# Patient Record
Sex: Female | Born: 1965
Health system: Southern US, Community
[De-identification: ages and names within clinical notes are randomized; demographics above are authoritative.]

## PROBLEM LIST (undated history)

## (undated) DIAGNOSIS — R51 Headache: Secondary | ICD-10-CM

## (undated) DIAGNOSIS — D693 Immune thrombocytopenic purpura: Secondary | ICD-10-CM

## (undated) DIAGNOSIS — R32 Unspecified urinary incontinence: Secondary | ICD-10-CM

## (undated) DIAGNOSIS — D126 Benign neoplasm of colon, unspecified: Secondary | ICD-10-CM

## (undated) DIAGNOSIS — Z973 Presence of spectacles and contact lenses: Secondary | ICD-10-CM

## (undated) DIAGNOSIS — R519 Headache, unspecified: Secondary | ICD-10-CM

## (undated) DIAGNOSIS — J3089 Other allergic rhinitis: Secondary | ICD-10-CM

## (undated) DIAGNOSIS — M545 Low back pain, unspecified: Secondary | ICD-10-CM

## (undated) DIAGNOSIS — E039 Hypothyroidism, unspecified: Secondary | ICD-10-CM

## (undated) DIAGNOSIS — Z87898 Personal history of other specified conditions: Secondary | ICD-10-CM

## (undated) DIAGNOSIS — K219 Gastro-esophageal reflux disease without esophagitis: Secondary | ICD-10-CM

## (undated) HISTORY — DX: Personal history of other specified conditions: Z87.898

## (undated) HISTORY — DX: Benign neoplasm of colon, unspecified: D12.6

## (undated) HISTORY — DX: Immune thrombocytopenic purpura: D69.3

---
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
## 1988-05-03 DIAGNOSIS — D693 Immune thrombocytopenic purpura: Secondary | ICD-10-CM

## 1988-05-03 HISTORY — DX: Immune thrombocytopenic purpura: D69.3

## 2001-05-03 HISTORY — PX: ABDOMINAL HYSTERECTOMY: SHX81

## 2001-05-03 HISTORY — PX: TOTAL ABDOMINAL HYSTERECTOMY: SHX209

## 2004-03-17 ENCOUNTER — Inpatient Hospital Stay: Payer: Self-pay | Admitting: Unknown Physician Specialty

## 2004-06-19 ENCOUNTER — Ambulatory Visit: Payer: Self-pay | Admitting: Unknown Physician Specialty

## 2006-05-18 ENCOUNTER — Ambulatory Visit: Payer: Self-pay | Admitting: Unknown Physician Specialty

## 2007-05-04 HISTORY — PX: APPENDECTOMY: SHX54

## 2007-05-04 HISTORY — PX: CHOLECYSTECTOMY: SHX55

## 2007-10-29 ENCOUNTER — Emergency Department: Payer: Self-pay | Admitting: Emergency Medicine

## 2007-10-31 ENCOUNTER — Inpatient Hospital Stay: Payer: Self-pay | Admitting: General Surgery

## 2007-11-10 ENCOUNTER — Ambulatory Visit: Payer: Self-pay | Admitting: General Surgery

## 2007-12-04 ENCOUNTER — Ambulatory Visit: Payer: Self-pay | Admitting: General Surgery

## 2007-12-08 ENCOUNTER — Ambulatory Visit: Payer: Self-pay | Admitting: General Surgery

## 2008-04-10 ENCOUNTER — Ambulatory Visit: Payer: Self-pay | Admitting: Unknown Physician Specialty

## 2009-06-18 ENCOUNTER — Ambulatory Visit: Payer: Self-pay | Admitting: Unknown Physician Specialty

## 2009-06-24 ENCOUNTER — Ambulatory Visit: Payer: Self-pay | Admitting: Unknown Physician Specialty

## 2010-07-09 ENCOUNTER — Ambulatory Visit: Payer: Self-pay | Admitting: Unknown Physician Specialty

## 2011-07-13 ENCOUNTER — Ambulatory Visit: Payer: Self-pay | Admitting: Unknown Physician Specialty

## 2012-07-26 ENCOUNTER — Ambulatory Visit: Payer: Self-pay | Admitting: Unknown Physician Specialty

## 2012-07-31 ENCOUNTER — Other Ambulatory Visit: Payer: Self-pay

## 2012-07-31 ENCOUNTER — Ambulatory Visit (INDEPENDENT_AMBULATORY_CARE_PROVIDER_SITE_OTHER): Payer: 59 | Admitting: General Surgery

## 2012-07-31 ENCOUNTER — Encounter: Payer: Self-pay | Admitting: General Surgery

## 2012-07-31 VITALS — BP 116/70 | HR 76 | Resp 14 | Ht 62.0 in | Wt 150.0 lb

## 2012-07-31 DIAGNOSIS — E041 Nontoxic single thyroid nodule: Secondary | ICD-10-CM

## 2012-07-31 DIAGNOSIS — N63 Unspecified lump in unspecified breast: Secondary | ICD-10-CM

## 2012-07-31 DIAGNOSIS — N640 Fissure and fistula of nipple: Secondary | ICD-10-CM

## 2012-07-31 DIAGNOSIS — N6002 Solitary cyst of left breast: Secondary | ICD-10-CM

## 2012-07-31 DIAGNOSIS — N6009 Solitary cyst of unspecified breast: Secondary | ICD-10-CM

## 2012-07-31 DIAGNOSIS — Z803 Family history of malignant neoplasm of breast: Secondary | ICD-10-CM

## 2012-07-31 NOTE — Progress Notes (Signed)
Patient ID: Martha Henry, female   DOB: Sep 10, 1965, 47 y.o.   MRN: [id number]  Chief Complaint  Patient presents with  . Follow-up    new patient cat 4 mammogram follow up    HPI Martha Henry is a 47 y.o. female here today for her follow up mammogram done on 07/26/12 cat 4. Patient reports no breast problem. Family history of breast cancer mother and Aunt. HPI  Past Medical History  Diagnosis Date  . ITP (idiopathic thrombocytopenic purpura) 1990    Past Surgical History  Procedure Laterality Date  . Cholecystectomy  2009  . Appendectomy  2009  . Abdominal hysterectomy  2003    partial  . Tonsillectomy  1975    Family History  Problem Relation Age of Onset  . Cancer Mother     breast  . Breast cancer Maternal Aunt   . Colon cancer Father   . Lung cancer Maternal Grandfather   . Cervical cancer Maternal Grandmother     Social History History  Substance Use Topics  . Smoking status: Never Smoker   . Smokeless tobacco: Never Used  . Alcohol Use: No    Allergies  Allergen Reactions  . Erythromycin Other (See Comments)    Chest pain    Current Outpatient Prescriptions  Medication Sig Dispense Refill  . Calcium-Vitamin D-Vitamin K (VIACTIV) 500-500-40 MG-UNT-MCG CHEW Chew by mouth.      Marland Kitchen imipramine (TOFRANIL) 25 MG tablet Take 25 mg by mouth daily.      . lansoprazole (PREVACID) 15 MG capsule Take 15 mg by mouth daily.      . Misc Natural Products (ALLERGY RELEAF SYSTEM PO) Take 10 mg by mouth daily.      . Multiple Vitamins-Minerals (MULTIVITAMIN WITH MINERALS) tablet Take 1 tablet by mouth daily.       No current facility-administered medications for this visit.    Review of Systems Review of Systems  Constitutional: Negative.   Respiratory: Negative.   Cardiovascular: Negative.     Height 5\' 2"  (1.575 m), weight 150 lb (68.04 kg).  Physical Exam Physical Exam  Constitutional: She appears well-developed and well-nourished.  Eyes: Conjunctivae are  normal.  Neck: Normal range of motion. Neck supple. Mass present.    Cardiovascular: Normal rate, regular rhythm and normal heart sounds.   Pulmonary/Chest: Effort normal and breath sounds normal. Right breast exhibits no inverted nipple, no mass, no nipple discharge, no skin change and no tenderness. Left breast exhibits no inverted nipple, no mass, no nipple discharge, no skin change and no tenderness.  Lymphadenopathy:    She has no cervical adenopathy.    She has no axillary adenopathy.    Data Reviewed Mammogram showed a nodule medial left breast. US showed a tiny cyst at 11 o'cl and a larger mass at 9 o'cl. These were reviewed.  Assessment    Breast cysts. FH of breast cancer. Thyroid nodule left lobe- ill defined.    Plan    With consent both cysts in left breast were aspirated.        Ples Specter 07/31/2012, 10:40 AM

## 2012-07-31 NOTE — Patient Instructions (Addendum)
Patient to return to the office in 6 weeks for a breast and thyroid ultrasound. Advised on benign nature of breast cysts.

## 2012-08-01 ENCOUNTER — Encounter: Payer: Self-pay | Admitting: General Surgery

## 2012-08-01 DIAGNOSIS — N6009 Solitary cyst of unspecified breast: Secondary | ICD-10-CM | POA: Insufficient documentation

## 2012-08-01 DIAGNOSIS — Z803 Family history of malignant neoplasm of breast: Secondary | ICD-10-CM | POA: Insufficient documentation

## 2012-08-01 DIAGNOSIS — E041 Nontoxic single thyroid nodule: Secondary | ICD-10-CM | POA: Insufficient documentation

## 2012-08-24 DIAGNOSIS — R339 Retention of urine, unspecified: Secondary | ICD-10-CM | POA: Insufficient documentation

## 2012-08-24 DIAGNOSIS — N393 Stress incontinence (female) (male): Secondary | ICD-10-CM | POA: Insufficient documentation

## 2012-09-07 ENCOUNTER — Encounter: Payer: Self-pay | Admitting: General Surgery

## 2012-09-07 ENCOUNTER — Ambulatory Visit (INDEPENDENT_AMBULATORY_CARE_PROVIDER_SITE_OTHER): Payer: 59 | Admitting: General Surgery

## 2012-09-07 ENCOUNTER — Other Ambulatory Visit: Payer: Self-pay

## 2012-09-07 ENCOUNTER — Other Ambulatory Visit: Payer: Self-pay | Admitting: *Deleted

## 2012-09-07 VITALS — BP 116/78 | HR 92 | Resp 18 | Ht 62.0 in | Wt 148.0 lb

## 2012-09-07 DIAGNOSIS — E041 Nontoxic single thyroid nodule: Secondary | ICD-10-CM

## 2012-09-07 DIAGNOSIS — N6002 Solitary cyst of left breast: Secondary | ICD-10-CM

## 2012-09-07 DIAGNOSIS — N6009 Solitary cyst of unspecified breast: Secondary | ICD-10-CM

## 2012-09-07 NOTE — Progress Notes (Signed)
The patient has been asked to return to the office in four months for a unilateral left breast diagnostic mammogram. 

## 2012-09-07 NOTE — Patient Instructions (Addendum)
Breast follow up in 4 months with left diagnostic mammogram. Await cytology report on thyroid.

## 2012-09-07 NOTE — Progress Notes (Signed)
Patient ID: Martha Henry, female   DOB: 08-29-1965, 47 y.o.   MRN: [id number]  Chief Complaint  Patient presents with  . Follow-up    breast and thyroid ultasound    HPI Martha Henry is a 47 y.o. female who presents for a follow up for breast and thyroid ultrasound. Patient states no new problems with the breast or thyroid at this time. She had 2 cysts aspirated from left breast uiq- 2 mos ago. Also noted a faint left lobe thyroid nodule. She is here for f/u on both with [place] HPI  Past Medical History  Diagnosis Date  . ITP (idiopathic thrombocytopenic purpura) 1990    Past Surgical History  Procedure Laterality Date  . Cholecystectomy  2009  . Appendectomy  2009  . Abdominal hysterectomy  2003    partial  . Tonsillectomy  1975    Family History  Problem Relation Age of Onset  . Cancer Mother     breast  . Breast cancer Maternal Aunt   . Colon cancer Father   . Lung cancer Maternal Grandfather   . Cervical cancer Maternal Grandmother     Social History History  Substance Use Topics  . Smoking status: Never Smoker   . Smokeless tobacco: Never Used  . Alcohol Use: No    Allergies  Allergen Reactions  . Erythromycin Other (See Comments)    Chest pain  . Aspirin Other (See Comments)    Platelet problems    Current Outpatient Prescriptions  Medication Sig Dispense Refill  . Calcium-Vitamin D-Vitamin K (VIACTIV) 500-500-40 MG-UNT-MCG CHEW Chew by mouth.      Marland Kitchen imipramine (TOFRANIL) 25 MG tablet Take 25 mg by mouth daily.      . lansoprazole (PREVACID) 15 MG capsule Take 15 mg by mouth daily.      . Misc Natural Products (ALLERGY RELEAF SYSTEM PO) Take 10 mg by mouth daily.      . Multiple Vitamins-Minerals (MULTIVITAMIN WITH MINERALS) tablet Take 1 tablet by mouth daily.      Marland Kitchen PREDNISOLONE PO Take 1 tablet by mouth daily. Taper. Tomorrow is the last day.       No current facility-administered medications for this visit.    Review of Systems Review of Systems   Constitutional: Negative.   Respiratory: Negative.   Cardiovascular: Negative.     Blood pressure 116/78, pulse 92, resp. rate 18, height 5\' 2"  (1.575 m), weight 148 lb (67.132 kg).  Physical Exam Physical Exam  Constitutional: She appears well-developed and well-nourished.   Patient was here for follow up ultrasound for thyroid and left breast.  Data Reviewed none  Assessment    Ultrasound shows left breast cyst at 10-11 o'clock is much smaller.  Thyroid ultrasound shows the nodule in left lobe with more hetrogeneous appearance today. FNA was performed.    Plan    Breast follow up in 4 months with left diagnostic mammogram. Await cytology report on thyroid.        Martha Henry 09/08/2012, 9:35 AM

## 2012-09-08 ENCOUNTER — Encounter: Payer: Self-pay | Admitting: General Surgery

## 2012-09-12 LAB — FINE-NEEDLE ASPIRATION

## 2012-09-13 ENCOUNTER — Telehealth: Payer: Self-pay | Admitting: *Deleted

## 2012-09-13 NOTE — Telephone Encounter (Signed)
Notified patient as instructed, patient pleased. Discussed follow-up appointments in 4 months, patient agrees

## 2013-01-23 ENCOUNTER — Encounter: Payer: Self-pay | Admitting: General Surgery

## 2013-01-23 ENCOUNTER — Ambulatory Visit: Payer: Self-pay | Admitting: General Surgery

## 2013-01-31 ENCOUNTER — Ambulatory Visit: Payer: 59 | Admitting: General Surgery

## 2013-01-31 ENCOUNTER — Ambulatory Visit (INDEPENDENT_AMBULATORY_CARE_PROVIDER_SITE_OTHER): Payer: 59 | Admitting: General Surgery

## 2013-01-31 ENCOUNTER — Other Ambulatory Visit: Payer: 59

## 2013-01-31 ENCOUNTER — Encounter: Payer: Self-pay | Admitting: General Surgery

## 2013-01-31 VITALS — BP 118/82 | HR 84 | Resp 14 | Ht 62.0 in | Wt 151.0 lb

## 2013-01-31 DIAGNOSIS — E041 Nontoxic single thyroid nodule: Secondary | ICD-10-CM

## 2013-01-31 DIAGNOSIS — N6002 Solitary cyst of left breast: Secondary | ICD-10-CM

## 2013-01-31 DIAGNOSIS — Z803 Family history of malignant neoplasm of breast: Secondary | ICD-10-CM

## 2013-01-31 DIAGNOSIS — N6009 Solitary cyst of unspecified breast: Secondary | ICD-10-CM

## 2013-01-31 NOTE — Progress Notes (Signed)
Patient ID: Martha Henry, female   DOB: May 03, 1966, 47 y.o.   MRN: [id number]  Chief Complaint  Patient presents with  . Follow-up    4 month follow up left diagnostic mammogram     HPI Martha Henry is a 47 y.o. female who presents for a breast evaluation. She had 2 cysts aspirated from left breast.The most recent mammogram was done on 01/23/13 with a birad category 3. Patient does perform regular self breast checks and gets regular mammograms done. The patient denies any new breast problems at this time.       HPI  Past Medical History  Diagnosis Date  . ITP (idiopathic thrombocytopenic purpura) 1990    Past Surgical History  Procedure Laterality Date  . Cholecystectomy  2009  . Appendectomy  2009  . Abdominal hysterectomy  2003    partial  . Tonsillectomy  1975    Family History  Problem Relation Age of Onset  . Cancer Mother     breast  . Breast cancer Maternal Aunt   . Colon cancer Father   . Lung cancer Maternal Grandfather   . Cervical cancer Maternal Grandmother     Social History History  Substance Use Topics  . Smoking status: Never Smoker   . Smokeless tobacco: Never Used  . Alcohol Use: No    Allergies  Allergen Reactions  . Erythromycin Other (See Comments)    Chest pain  . Aspirin Other (See Comments)    Platelet problems    Current Outpatient Prescriptions  Medication Sig Dispense Refill  . Calcium-Vitamin D-Vitamin K (VIACTIV) 500-500-40 MG-UNT-MCG CHEW Chew by mouth.      Marland Kitchen imipramine (TOFRANIL) 25 MG tablet Take 25 mg by mouth daily.      . lansoprazole (PREVACID) 15 MG capsule Take 15 mg by mouth daily.      . Misc Natural Products (ALLERGY RELEAF SYSTEM PO) Take 10 mg by mouth daily.      . Multiple Vitamins-Minerals (MULTIVITAMIN WITH MINERALS) tablet Take 1 tablet by mouth daily.      Marland Kitchen PREDNISOLONE PO Take 1 tablet by mouth daily. Taper. Tomorrow is the last day.       No current facility-administered medications for this visit.     Review of Systems Review of Systems  Constitutional: Negative.   Respiratory: Negative.   Cardiovascular: Negative.     Blood pressure 118/82, pulse 84, resp. rate 14, height 5\' 2"  (1.575 m), weight 151 lb (68.493 kg).  Physical Exam Physical Exam  Constitutional: She is oriented to person, place, and time. She appears well-developed and well-nourished.  Eyes: Conjunctivae are normal. No scleral icterus.  Neck: No thyromegaly present.  Pulmonary/Chest: Right breast exhibits no inverted nipple, no mass, no nipple discharge, no skin change and no tenderness. Left breast exhibits no inverted nipple, no mass, no nipple discharge, no skin change and no tenderness.  Lymphadenopathy:    She has no cervical adenopathy.    She has no axillary adenopathy.  Neurological: She is alert and oriented to person, place, and time.  Skin: Skin is warm and dry.    Data Reviewed  Mammogram left reviewed and stable small nodule as noted before. [place] of thyroid today showed no defined mass on left.  Assessment    Stable exam. Breast cyst, left thyroid nodule benign by FNA.     Plan    Patient to return in 6 months with a bilateral diagnostic mammogram. Patient to also have thyroid ultrasound at that time.  Jamyrah Saur G 01/31/2013, 11:19 AM

## 2013-01-31 NOTE — Patient Instructions (Addendum)
Patient to continue self breast checks. She is also to contact our office with any new concerns or questions. Patient to follow up in 6 months with a bilateral diagnostic mammogram.

## 2013-07-17 ENCOUNTER — Ambulatory Visit: Payer: Self-pay | Admitting: General Surgery

## 2013-07-18 ENCOUNTER — Encounter: Payer: Self-pay | Admitting: General Surgery

## 2013-07-24 ENCOUNTER — Ambulatory Visit: Payer: 59

## 2013-07-24 ENCOUNTER — Ambulatory Visit (INDEPENDENT_AMBULATORY_CARE_PROVIDER_SITE_OTHER): Payer: 59 | Admitting: General Surgery

## 2013-07-24 ENCOUNTER — Encounter: Payer: Self-pay | Admitting: General Surgery

## 2013-07-24 VITALS — BP 112/60 | HR 74 | Resp 12 | Ht 62.0 in | Wt 155.0 lb

## 2013-07-24 DIAGNOSIS — N6019 Diffuse cystic mastopathy of unspecified breast: Secondary | ICD-10-CM

## 2013-07-24 DIAGNOSIS — E041 Nontoxic single thyroid nodule: Secondary | ICD-10-CM

## 2013-07-24 DIAGNOSIS — Z803 Family history of malignant neoplasm of breast: Secondary | ICD-10-CM

## 2013-07-24 NOTE — Progress Notes (Signed)
Patient ID: Martha Henry, female   DOB: [birth year], 48 y.o.   MRN: [id number]  Chief Complaint  Patient presents with  . Follow-up    mammogram and thyroid ultrasound    HPI Martha Henry is a 48 y.o. female.  who presents for her follow up breast evaluation and thyroid ultrasound. The most recent mammogram was done on 07-17-13.  Patient does perform regular self breast checks and gets regular mammograms done.  No new complaints.   HPI  Past Medical History  Diagnosis Date  . ITP (idiopathic thrombocytopenic purpura) 1990    Past Surgical History  Procedure Laterality Date  . Cholecystectomy  2009  . Appendectomy  2009  . Abdominal hysterectomy  2003    partial  . Tonsillectomy  1975    Family History  Problem Relation Age of Onset  . Cancer Mother     breast  . Breast cancer Maternal Aunt   . Colon cancer Father   . Lung cancer Maternal Grandfather   . Cervical cancer Maternal Grandmother     Social History History  Substance Use Topics  . Smoking status: Never Smoker   . Smokeless tobacco: Never Used  . Alcohol Use: No    Allergies  Allergen Reactions  . Erythromycin Other (See Comments)    Chest pain  . Aspirin Other (See Comments)    Platelet problems    Current Outpatient Prescriptions  Medication Sig Dispense Refill  . Calcium-Vitamin D-Vitamin K (VIACTIV) 166-063-01 MG-UNT-MCG CHEW Chew by mouth.      Marland Kitchen imipramine (TOFRANIL) 25 MG tablet Take 25 mg by mouth daily.      . lansoprazole (PREVACID) 15 MG capsule Take 15 mg by mouth daily.      . Misc Natural Products (ALLERGY RELEAF SYSTEM PO) Take 10 mg by mouth daily.      . Multiple Vitamins-Minerals (MULTIVITAMIN WITH MINERALS) tablet Take 1 tablet by mouth daily.      Marland Kitchen sulfacetamide (BLEPH-10) 10 % ophthalmic solution as needed.        No current facility-administered medications for this visit.    Review of Systems Review of Systems  Constitutional: Negative.   Respiratory: Negative.    Cardiovascular: Negative.     Blood pressure 112/60, pulse 74, resp. rate 12, height 5\' 2"  (1.575 m), weight 155 lb (70.308 kg).  Physical Exam Physical Exam  Constitutional: She is oriented to person, place, and time. She appears well-developed and well-nourished.  Eyes: No scleral icterus.  Neck: Neck supple. No thyromegaly present.  Cardiovascular: Normal rate, regular rhythm and normal heart sounds.   Pulmonary/Chest: Effort normal and breath sounds normal. Right breast exhibits no inverted nipple, no mass, no nipple discharge, no skin change and no tenderness. Left breast exhibits no inverted nipple, no mass, no nipple discharge, no skin change and no tenderness.  Abdominal: Soft. There is no tenderness.  Lymphadenopathy:    She has no cervical adenopathy.    She has no axillary adenopathy.  Neurological: She is alert and oriented to person, place, and time.  Skin: Skin is warm and dry.    Data Reviewed Mammogram reviewed.  Assessment    Stable exam. FCD,FH breast ca.The previously noted thyroid nodule is no longer palpable.Prior FNA was negative     Plan    Follow up in one year with bilateral screening mammogram and office visit.        Tamika Nou G 07/24/2013, 3:34 PM

## 2013-07-24 NOTE — Patient Instructions (Signed)
Continue self breast exams. Call office for any new breast issues or concerns. 

## 2013-08-01 ENCOUNTER — Ambulatory Visit: Payer: 59 | Admitting: General Surgery

## 2014-03-04 ENCOUNTER — Encounter: Payer: Self-pay | Admitting: General Surgery

## 2014-05-03 DIAGNOSIS — D126 Benign neoplasm of colon, unspecified: Secondary | ICD-10-CM

## 2014-05-03 HISTORY — DX: Benign neoplasm of colon, unspecified: D12.6

## 2014-08-01 ENCOUNTER — Encounter: Payer: Self-pay | Admitting: General Surgery

## 2014-08-01 ENCOUNTER — Ambulatory Visit: Payer: Self-pay | Admitting: General Surgery

## 2014-08-01 ENCOUNTER — Ambulatory Visit (INDEPENDENT_AMBULATORY_CARE_PROVIDER_SITE_OTHER): Payer: 59 | Admitting: General Surgery

## 2014-08-01 VITALS — BP 110/80 | HR 80 | Resp 12 | Ht 62.0 in | Wt 155.0 lb

## 2014-08-01 DIAGNOSIS — Z803 Family history of malignant neoplasm of breast: Secondary | ICD-10-CM | POA: Diagnosis not present

## 2014-08-01 DIAGNOSIS — N6019 Diffuse cystic mastopathy of unspecified breast: Secondary | ICD-10-CM | POA: Diagnosis not present

## 2014-08-01 NOTE — Progress Notes (Signed)
Patient ID: Martha Henry, female   DOB: 03/11/1966, 49 y.o.   MRN: [id number]  Chief Complaint  Patient presents with  . Follow-up    mammogram    HPI Martha Henry is a 49 y.o. female who presents for a breast evaluation. The most recent mammogram was done on 07/22/14.  Patient does perform regular self breast checks and gets regular mammograms done. No new breast issues.    HPI  Past Medical History  Diagnosis Date  . ITP (idiopathic thrombocytopenic purpura) 1990    Past Surgical History  Procedure Laterality Date  . Cholecystectomy  2009  . Appendectomy  2009  . Abdominal hysterectomy  2003    partial  . Tonsillectomy  1975    Family History  Problem Relation Age of Onset  . Cancer Mother     breast  . Breast cancer Maternal Aunt   . Colon cancer Father   . Lung cancer Maternal Grandfather   . Cervical cancer Maternal Grandmother     Social History History  Substance Use Topics  . Smoking status: Never Smoker   . Smokeless tobacco: Never Used  . Alcohol Use: No    Allergies  Allergen Reactions  . Erythromycin Other (See Comments)    Chest pain  . Aspirin Other (See Comments)    Platelet problems    Current Outpatient Prescriptions  Medication Sig Dispense Refill  . Calcium-Vitamin D-Vitamin K (VIACTIV) [id number] MG-UNT-MCG CHEW Chew by mouth.    . cholecalciferol (VITAMIN D) 1000 UNITS tablet Take 1,000 Units by mouth daily.    Marland Kitchen imipramine (TOFRANIL) 25 MG tablet Take 25 mg by mouth daily.    . lansoprazole (PREVACID) 15 MG capsule Take 15 mg by mouth daily.    Marland Kitchen loratadine (CLARITIN) 10 MG tablet Take 10 mg by mouth daily as needed for allergies.    . Multiple Vitamins-Minerals (MULTIVITAMIN WITH MINERALS) tablet Take 1 tablet by mouth daily.    . Nutritional Supplements (ESTROVEN PO) Take by mouth daily.    Marland Kitchen sulfacetamide (BLEPH-10) 10 % ophthalmic solution as needed.      No current facility-administered medications for this visit.     Review of Systems Review of Systems  Constitutional: Negative.   Respiratory: Negative.   Cardiovascular: Negative.     Blood pressure 110/80, pulse 80, resp. rate 12, height 5\' 2"  (1.575 m), weight 155 lb (70.308 kg).  Physical Exam Physical Exam  Constitutional: She is oriented to person, place, and time. She appears well-developed and well-nourished.  Eyes: Conjunctivae are normal. No scleral icterus.  Neck: Neck supple. No thyromegaly present.  No palpable thyroid mass.  Cardiovascular: Normal rate, regular rhythm and normal heart sounds.   Pulmonary/Chest: Effort normal and breath sounds normal. Right breast exhibits no inverted nipple, no mass, no nipple discharge, no skin change and no tenderness. Left breast exhibits no inverted nipple, no mass, no nipple discharge, no skin change and no tenderness.  Abdominal: Soft. Normal appearance. There is no tenderness.  Lymphadenopathy:    She has no cervical adenopathy.    She has no axillary adenopathy.  Neurological: She is alert and oriented to person, place, and time.  Skin: Skin is warm and dry.    Data Reviewed Mammogram reviewed and stable.  Assessment    Stable physical exam. Fibrocystic breast disease and family history of breast cancer.    Plan    Patient will be asked to return to the office in one year with a bilateral  screening mammogram. Continue self breast exams. Call office for any new breast issues or concerns.       Seraphine Gudiel G 08/01/2014, 9:57 AM

## 2014-08-01 NOTE — Patient Instructions (Addendum)
Continue self breast exams. Call office for any new breast issues or concerns. Patient will be asked to return to the office in one year with a bilateral screening mammogram.

## 2014-09-02 ENCOUNTER — Ambulatory Visit: Payer: 59 | Admitting: General Surgery

## 2014-09-10 ENCOUNTER — Ambulatory Visit: Payer: 59 | Admitting: General Surgery

## 2014-10-02 ENCOUNTER — Encounter: Payer: Self-pay | Admitting: *Deleted

## 2014-11-05 ENCOUNTER — Ambulatory Visit: Payer: Self-pay | Admitting: Family Medicine

## 2014-11-20 ENCOUNTER — Ambulatory Visit (INDEPENDENT_AMBULATORY_CARE_PROVIDER_SITE_OTHER): Payer: Commercial Managed Care - HMO | Admitting: Family Medicine

## 2014-11-20 ENCOUNTER — Encounter: Payer: Self-pay | Admitting: Family Medicine

## 2014-11-20 VITALS — BP 128/77 | HR 106 | Temp 98.7°F | Ht 61.5 in | Wt 150.0 lb

## 2014-11-20 DIAGNOSIS — Z862 Personal history of diseases of the blood and blood-forming organs and certain disorders involving the immune mechanism: Secondary | ICD-10-CM | POA: Diagnosis not present

## 2014-11-20 DIAGNOSIS — E039 Hypothyroidism, unspecified: Secondary | ICD-10-CM | POA: Insufficient documentation

## 2014-11-20 DIAGNOSIS — M25511 Pain in right shoulder: Secondary | ICD-10-CM | POA: Diagnosis not present

## 2014-11-20 DIAGNOSIS — R682 Dry mouth, unspecified: Secondary | ICD-10-CM | POA: Diagnosis not present

## 2014-11-20 DIAGNOSIS — R350 Frequency of micturition: Secondary | ICD-10-CM | POA: Diagnosis not present

## 2014-11-20 DIAGNOSIS — Z800 Family history of malignant neoplasm of digestive organs: Secondary | ICD-10-CM

## 2014-11-20 DIAGNOSIS — J309 Allergic rhinitis, unspecified: Secondary | ICD-10-CM | POA: Diagnosis not present

## 2014-11-20 MED ORDER — FLUTICASONE PROPIONATE 50 MCG/ACT NA SUSP: 2.0000 | Freq: Every day | NASAL | Status: DC

## 2014-11-20 NOTE — Assessment & Plan Note (Signed)
Seeing Dr. Eddie Dibbles, endocrinologist; last TSH over 6; she will get repeat labs through endo office

## 2014-11-20 NOTE — Assessment & Plan Note (Signed)
Stressed importance of getting screening colonoscopy, she is well overdue for her first screening; she will call Dr. Jamal Collin; stool cards given

## 2014-11-20 NOTE — Assessment & Plan Note (Signed)
On TCA from urologist, Dr. Jacqlyn Larsen, likely explains her dry mouth; she wishes to stay on the medicine

## 2014-11-20 NOTE — Assessment & Plan Note (Signed)
Avoiding aspirin-based products; last platelet count was 331 in April

## 2014-11-20 NOTE — Patient Instructions (Addendum)
Please do the stool cards and please do consider getting a colonoscopy (I strongly encourage that) If you father had colon cancer, then screening for you begins 10 years prior to his diagnosis (which would have been age 47 for you) Return in the spring to see your gynecologist for well woman exam when due Return as needed or yearly for follow-up Try turmeric as a natural anti-inflammatory (for pain and arthritis). It comes in capsules where you buy aspirin and fish oil, but also as a spice where you buy pepper and garlic powder. Use ice topically for 15-20 minutes over the front and side of the shoulder 2-3 times a day If shoulder does not improve to your satisfaction, call and we'll refer to physical therapy

## 2014-11-20 NOTE — Progress Notes (Signed)
BP 128/77 mmHg  Pulse 106  Temp(Src) 98.7 F (37.1 C)  Ht 5' 1.5" (1.562 m)  Wt 150 lb (68.04 kg)  BMI 27.89 kg/m2  SpO2 100%   Subjective:    Patient ID: Martha Henry, female    DOB: 06-19-1965, 49 y.o.   MRN: [id number]  HPI: Martha Henry is a 49 y.o. female  Chief Complaint  Patient presents with  . Establish Care   She has been going to [facility] and has done without a regular MD for a while; they had their own regular physician there but she moved away, then saw PA a time or two but then they closed office She had a sinus infection in May, gets those occasionally She saw one of the providers in the spring  Seeing Dr. Eddie Dibbles at [place] ([facility]) and goes back in August to see if labs are back normal; TSH j6.110 in April; has felt sluggish and tired; no hair loss; feeling a little irritability; thought maybe getting to through menopausal; they think all thyroid; no constipation now; some cramps on the left side, not every time she goes to the bathroom; they want her to have a colonoscopy and had an appt wiith Dr. Jamal Collin; he took out her appendix and gallbladder in 2009; going to do the colonoscopy but backed out; her father had colon cancer at age 76  Normal vitamin D, she takes some extra  They keep up with her platelets; she had a flare up one summer, had petechiae; bruises easily on her arms; platelet count 331 in April; she had ITP many years ago, underwent bone marrow biopsy; never found out why; not sure if aspirin-related products; still to this day avoids aspirin products  She had a very healthy cholesterol  Panel in April  Normal glucose in April too; we reviewed her labs; does have dry mouth; on TCA for bladder issues  Issues with her right shoulder; bed not comfortable; right now not bothering her; lays down at night, puts hand under her pillow and lays and hurts; can go to the left side and puts arm up or it feels like arm is falling; not sure if she hurt it; sore  and bothering her; just last few weeks; right handed; right shoulder; gets in elbow and hand too; trouble doing hard grip, just feels funny  Relevant past medical, surgical, family and social history reviewed and updated as indicated. Interim medical history since our last visit reviewed. Allergies and medications reviewed and updated. Family History  Problem Relation Age of Onset  . Cancer Mother     breast  . Breast cancer Maternal Aunt   . Cancer Maternal Aunt     breast  . Colon cancer Father   . Cancer Father     colon  . Lung cancer Maternal Grandfather   . Cervical cancer Maternal Grandmother   no known ovarian or uterine cancer  Review of Systems Per HPI unless specifically indicated above     Objective:    BP 128/77 mmHg  Pulse 106  Temp(Src) 98.7 F (37.1 C)  Ht 5' 1.5" (1.562 m)  Wt 150 lb (68.04 kg)  BMI 27.89 kg/m2  SpO2 100%  Wt Readings from Last 3 Encounters:  11/20/14 150 lb (68.04 kg)  08/01/14 155 lb (70.308 kg)  07/24/13 155 lb (70.308 kg)    Physical Exam  Constitutional: She appears well-developed and well-nourished.  HENT:  Right Ear: Hearing, tympanic membrane, external ear and ear canal  normal.  Left Ear: Hearing, tympanic membrane, external ear and ear canal normal.  Mouth/Throat: Mucous membranes are not dry.  Neck:  Very slight enlargement right lobe thyroid relative to left  Cardiovascular: Regular rhythm.   No extrasystoles are present. Tachycardia present.   Pulmonary/Chest: Effort normal and breath sounds normal.  Musculoskeletal:       Right shoulder: She exhibits normal range of motion, no tenderness, no bony tenderness, no swelling, no crepitus, no deformity, no pain and normal strength.  Neurological:  Reflex Scores:      Patellar reflexes are 2+ on the right side and 2+ on the left side. Psychiatric: She has a normal mood and affect. Her speech is normal and behavior is normal. Judgment and thought content normal. Cognition and  memory are normal.      Assessment & Plan:   Problem List Items Addressed This Visit      Respiratory   Rhinitis, allergic     Endocrine   Hypothyroidism    Seeing Dr. Eddie Dibbles, endocrinologist; last TSH over 6; she will get repeat labs through endo office      Relevant Medications   levothyroxine (SYNTHROID, LEVOTHROID) 50 MCG tablet     Other   Family hx of colon cancer - Primary    Stressed importance of getting screening colonoscopy, she is well overdue for her first screening; she will call Dr. Jamal Collin; stool cards given      Urinary frequency    On TCA from urologist, Dr. Jacqlyn Larsen, likely explains her dry mouth; she wishes to stay on the medicine      History of ITP    Avoiding aspirin-based products; last platelet count was 331 in April       Other Visit Diagnoses    Dry mouth        normal glucose and normal A1C; likely TCA; recheck glucose in one year from last (April)    Acute shoulder pain, right        turmeric and topical ice; refer to PT if not improving        Follow up plan: Return if symptoms worsen or fail to improve.

## 2015-01-08 ENCOUNTER — Ambulatory Visit
Admission: RE | Admit: 2015-01-08 | Discharge: 2015-01-08 | Disposition: A | Discharge status: Home / Self Care | Payer: 59 | Source: Ambulatory Visit | Attending: Family Medicine | Admitting: Family Medicine

## 2015-01-08 ENCOUNTER — Telehealth: Payer: Self-pay | Admitting: Family Medicine

## 2015-01-08 ENCOUNTER — Ambulatory Visit (INDEPENDENT_AMBULATORY_CARE_PROVIDER_SITE_OTHER): Payer: Commercial Managed Care - HMO | Admitting: Family Medicine

## 2015-01-08 ENCOUNTER — Encounter: Payer: Self-pay | Admitting: Family Medicine

## 2015-01-08 VITALS — BP 125/78 | HR 112 | Temp 97.4°F | Wt 148.0 lb

## 2015-01-08 DIAGNOSIS — N390 Urinary tract infection, site not specified: Secondary | ICD-10-CM

## 2015-01-08 DIAGNOSIS — R300 Dysuria: Secondary | ICD-10-CM | POA: Insufficient documentation

## 2015-01-08 DIAGNOSIS — R319 Hematuria, unspecified: Secondary | ICD-10-CM | POA: Insufficient documentation

## 2015-01-08 DIAGNOSIS — R8271 Bacteriuria: Secondary | ICD-10-CM

## 2015-01-08 DIAGNOSIS — R350 Frequency of micturition: Secondary | ICD-10-CM | POA: Diagnosis not present

## 2015-01-08 DIAGNOSIS — R8281 Pyuria: Secondary | ICD-10-CM

## 2015-01-08 DIAGNOSIS — M5136 Other intervertebral disc degeneration, lumbar region: Secondary | ICD-10-CM | POA: Insufficient documentation

## 2015-01-08 LAB — MICROSCOPIC EXAMINATION: RBC, UA: >30 /hpf — AB (ref 0–?)

## 2015-01-08 MED ORDER — IOHEXOL 300 MG/ML  SOLN
125.0000 mL | Freq: Once | INTRAMUSCULAR | Status: AC | PRN
  Administered 2015-01-08: 125 mL via INTRAVENOUS

## 2015-01-08 MED ORDER — CIPROFLOXACIN HCL 500 MG PO TABS: 500.0000 mg | ORAL_TABLET | Freq: Two times a day (BID) | ORAL | Status: DC

## 2015-01-08 NOTE — Assessment & Plan Note (Signed)
11-30 WBCs/hpf with few bacteria on urine micro; 1+ leuk est on the dip; discussed case with Dr. Jacqlyn Larsen, urologist; he recommended CT urogram equivalent; will send now for stat CT scan; checking BUN and creatinine prior to scan; will start appropriate antibiotic if indicated after scan results have been called

## 2015-01-08 NOTE — Telephone Encounter (Signed)
I need to see her BUN and creatinine please; she was supposed to get that done before contrast Call Dr. Bjorn Loser office and ask him to look at her scan; can he see her tomorrow or Friday? Call patient after you get those results and I'll tell her about the CT scan results

## 2015-01-08 NOTE — Progress Notes (Signed)
BP 125/78 mmHg  Pulse 112  Temp(Src) 97.4 F (36.3 C)  Wt 148 lb (67.132 kg)  SpO2 100%   Subjective:    Patient ID: Martha Henry, female    DOB: 1965/07/01, 49 y.o.   MRN: [id number]  HPI: Martha Henry is a 49 y.o. female  Chief Complaint  Patient presents with  . Flank Pain    She thinks she may have a kidney stone. Started this am at 1:30 and feeling lots of pressure, discomfort, and some pain.   At 1:30 this morning she got up to go to the bathroom and had a lot of pressure; every 15 minutes since then, going to the bathroom; has not been back to sleep; not really pain, but a lot of pressure; when she does urinate, no real burning; at the end it shuts off and then there is a little bit of pain; nothing like this before; she had intercourse on Sunday and that was painful right away, enough that they stopped; no vaginal discharge or bleeding; s/p hysterectomy, painful heavy periods, no cancer; Dr. Jacqlyn Larsen sees her (urologist) Reubin Milan take aspirin products because of ITP and has to avoid everything except for tylenol  She has been taking turmeric for the right shoulder and finding it helpful  Relevant past medical, surgical, family and social history reviewed and updated as indicated. Interim medical history since our last visit reviewed. Allergies and medications reviewed and updated. No known family hx of kidney stones  Review of Systems  Constitutional: Negative for fever and chills.  Gastrointestinal: Positive for abdominal pain. Negative for nausea, vomiting, diarrhea and blood in stool.  Genitourinary: Positive for dysuria, urgency, frequency, hematuria, decreased urine volume, difficulty urinating, pelvic pain and dyspareunia. Negative for flank pain, vaginal bleeding and vaginal discharge.  Per HPI unless specifically indicated above     Objective:    BP 125/78 mmHg  Pulse 112  Temp(Src) 97.4 F (36.3 C)  Wt 148 lb (67.132 kg)  SpO2 100%  Wt Readings from Last  3 Encounters:  01/08/15 148 lb (67.132 kg)  11/20/14 150 lb (68.04 kg)  08/01/14 155 lb (70.308 kg)    Physical Exam  Constitutional: She appears well-developed and well-nourished. No distress.  Weight loss of 7 pounds in 5-1/2 months  Eyes: EOM are normal. No scleral icterus.  Neck: No thyromegaly present.  Cardiovascular: Regular rhythm and normal heart sounds.   No extrasystoles are present. Tachycardia present.   Pulmonary/Chest: Effort normal.  Abdominal: Soft. Bowel sounds are normal. She exhibits mass (discrete tender fullness along the right pelvis with palpation). She exhibits no distension. There is no guarding and no CVA tenderness.  Musculoskeletal:       Lumbar back: She exhibits no tenderness.  Skin: Skin is warm and dry. She is not diaphoretic. No pallor.  Psychiatric: She has a normal mood and affect. Her behavior is normal. Judgment and thought content normal.   Urine today: 3+ blood, 1+ protein, 1+ LE, 11-30 WBCs, >30 RBCs; few bacteria    Assessment & Plan:   Problem List Items Addressed This Visit      Genitourinary   Bacteriuria with pyuria    11-30 WBCs/hpf with few bacteria on urine micro; 1+ leuk est on the dip; discussed case with Dr. Jacqlyn Larsen, urologist; he recommended CT urogram equivalent; will send now for stat CT scan; checking BUN and creatinine prior to scan; will start appropriate antibiotic if indicated after scan results have been called  Relevant Orders   Basic metabolic panel   CT Abdomen Pelvis W Contrast   CT Abdomen Pelvis W Wo Contrast   BUN   Creatinine     Other   Urinary frequency    Discussed case with urologist; she has been on TCA for some time; likely infection; antibiotics once CT resulted      Hematuria    No personal or fam hx of kidney stones; ddx includes hemorrhagic cystitis, kidney stone with or without obstruction, mass, cancer; will get CT scan, discussed case with urologist; further disposition pending CT scan  results      Relevant Orders   Basic metabolic panel   CT Abdomen Pelvis W Contrast   CT Abdomen Pelvis W Wo Contrast   BUN   Creatinine    Other Visit Diagnoses    Dysuria    -  Primary    Relevant Orders    UA/M w/rflx Culture, Routine (Completed)    Basic metabolic panel    CT Abdomen Pelvis W Contrast    CT Abdomen Pelvis W Wo Contrast    BUN    Creatinine       Follow up plan: Return if symptoms worsen or fail to improve. More disposition after scan results

## 2015-01-08 NOTE — Telephone Encounter (Signed)
I talked to tech, creatinine 0.8 (done on iSTAT machine) I talked to patient, reviewed report line by line Asked her to call Dr. Jacqlyn Larsen in about an hour to see about getting in to see him in 1-2 days; go to ER if worse Patient should have urine rechecked in 2-3 weeks either way, whether Dr. Jacqlyn Larsen sees her or sends her back to me, to make sure urine has cleared .............................................Marland Kitchen AMY -- Please just call Dr. Bjorn Loser office, tell him CT ready if he will look at that, especially distal right ureter Ask if he'll see pt in day or two and f/u on this Let him know I started cipro 500 mg BID if he agrees, great; if not, change to something else Culture is pending

## 2015-01-08 NOTE — Telephone Encounter (Signed)
I spoke with patient, she states that she has already called Dr. Bjorn Loser office. He is going to look at her scans tonight and call her tomorrow to advise her on when he wants to see her. I advised her if she does not hear back from them, to please let us know.

## 2015-01-08 NOTE — Assessment & Plan Note (Signed)
No personal or fam hx of kidney stones; ddx includes hemorrhagic cystitis, kidney stone with or without obstruction, mass, cancer; will get CT scan, discussed case with urologist; further disposition pending CT scan results

## 2015-01-08 NOTE — Assessment & Plan Note (Signed)
Discussed case with urologist; she has been on TCA for some time; likely infection; antibiotics once CT resulted

## 2015-01-08 NOTE — Patient Instructions (Addendum)
Hydrate, hydrate, hydrate Go from here to the hospital Have bloodwork done FIRST Do not let anybody give you contrast until we know that your kidneys are okay If you are not able to urinate or the pressure gets worse, check yourself in to the ER Contact Dr. Jacqlyn Larsen as needed Remain at the hospital until we talk; I will contact you at [phone] with the results

## 2015-01-10 ENCOUNTER — Telehealth: Payer: Self-pay | Admitting: Family Medicine

## 2015-01-10 LAB — URINE CULTURE, REFLEX

## 2015-01-10 LAB — UA/M W/RFLX CULTURE, ROUTINE

## 2015-01-10 NOTE — Telephone Encounter (Signed)
I called to f/u on her visit from earlier this week; I left message; I know she was going to be working with Dr. Jacqlyn Larsen; hope she is feeling better; call me here at the office if there is anything I can do

## 2015-02-03 ENCOUNTER — Ambulatory Visit (INDEPENDENT_AMBULATORY_CARE_PROVIDER_SITE_OTHER): Payer: Commercial Managed Care - HMO | Admitting: General Surgery

## 2015-02-03 ENCOUNTER — Encounter: Payer: Self-pay | Admitting: General Surgery

## 2015-02-03 VITALS — BP 120/72 | HR 78 | Resp 12 | Ht 66.0 in | Wt 153.0 lb

## 2015-02-03 DIAGNOSIS — Z803 Family history of malignant neoplasm of breast: Secondary | ICD-10-CM | POA: Diagnosis not present

## 2015-02-03 DIAGNOSIS — Z1211 Encounter for screening for malignant neoplasm of colon: Secondary | ICD-10-CM

## 2015-02-03 DIAGNOSIS — Z800 Family history of malignant neoplasm of digestive organs: Secondary | ICD-10-CM | POA: Diagnosis not present

## 2015-02-03 MED ORDER — POLYETHYLENE GLYCOL 3350 17 GM/SCOOP PO POWD: ORAL | Status: DC

## 2015-02-03 NOTE — Patient Instructions (Addendum)
Colonoscopy A colonoscopy is an exam to look at the entire large intestine (colon). This exam can help find problems such as tumors, polyps, inflammation, and areas of bleeding. The exam takes about 1 hour.  LET [facility] CARE PROVIDER KNOW ABOUT:   Any allergies you have.  All medicines you are taking, including vitamins, herbs, eye drops, creams, and over-the-counter medicines.  Previous problems you or members of your family have had with the use of anesthetics.  Any blood disorders you have.  Previous surgeries you have had.  Medical conditions you have. RISKS AND COMPLICATIONS  Generally, this is a safe procedure. However, as with any procedure, complications can occur. Possible complications include:  Bleeding.  Tearing or rupture of the colon wall.  Reaction to medicines given during the exam.  Infection (rare). BEFORE THE PROCEDURE   Ask your health care provider about changing or stopping your regular medicines.  You may be prescribed an oral bowel prep. This involves drinking a large amount of medicated liquid, starting the day before your procedure. The liquid will cause you to have multiple loose stools until your stool is almost clear or light green. This cleans out your colon in preparation for the procedure.  Do not eat or drink anything else once you have started the bowel prep, unless your health care provider tells you it is safe to do so.  Arrange for someone to drive you home after the procedure. PROCEDURE   You will be given medicine to help you relax (sedative).  You will lie on your side with your knees bent.  A long, flexible tube with a light and camera on the end (colonoscope) will be inserted through the rectum and into the colon. The camera sends video back to a computer screen as it moves through the colon. The colonoscope also releases carbon dioxide gas to inflate the colon. This helps your health care provider see the area better.  During  the exam, your health care provider may take a small tissue sample (biopsy) to be examined under a microscope if any abnormalities are found.  The exam is finished when the entire colon has been viewed. AFTER THE PROCEDURE   Do not drive for 24 hours after the exam.  You may have a small amount of blood in your stool.  You may pass moderate amounts of gas and have mild abdominal cramping or bloating. This is caused by the gas used to inflate your colon during the exam.  Ask when your test results will be ready and how you will get your results. Make sure you get your test results. Document Released: 04/16/2000 Document Revised: 02/07/2013 Document Reviewed: 12/25/2012 [facility] Patient Information 2015 [place], [place]. This information is not intended to replace advice given to you by your health care provider. Make sure you discuss any questions you have with your health care provider.  Patient has been scheduled for a colonoscopy on 03-05-15 at [facility].

## 2015-02-03 NOTE — Progress Notes (Signed)
Patient ID: Martha Henry, female   DOB: 05/15/1965, 49 y.o.   MRN: [id number]  Chief Complaint  Patient presents with  . Colonoscopy    HPI Martha Henry is a 49 y.o. female here today for an evaluation for a colonoscopy. She reports no GI issues. Moves bowel regularly. She has never had a colonoscopy. Her father had colon cancer at age 44. She has chronic headaches and sees Dr. Tami Ribas. She has thyroid issues and sees Dr. Eddie Dibbles. She is taking Synthroid.  HPI  Past Medical History  Diagnosis Date  . ITP (idiopathic thrombocytopenic purpura) 1990  . History of abnormal mammogram     seen by Dr. Jamal Collin    Past Surgical History  Procedure Laterality Date  . Cholecystectomy  2009  . Appendectomy  2009  . Tonsillectomy  1975  . Abdominal hysterectomy  2003    partial/only ovaries remain    Family History  Problem Relation Age of Onset  . Cancer Mother     breast  . Breast cancer Maternal Aunt   . Cancer Maternal Aunt     breast  . Colon cancer Father 76  . Cancer Father     colon  . Lung cancer Maternal Grandfather   . Cervical cancer Maternal Grandmother     Social History Social History  Substance Use Topics  . Smoking status: Never Smoker   . Smokeless tobacco: Never Used  . Alcohol Use: No    Allergies  Allergen Reactions  . Erythromycin Other (See Comments)    Chest pain  . Aspirin Other (See Comments)    Platelet problems    Current Outpatient Prescriptions  Medication Sig Dispense Refill  . Calcium-Vitamin D-Vitamin K (VIACTIV) [id number] MG-UNT-MCG CHEW Chew by mouth.    . cholecalciferol (VITAMIN D) 1000 UNITS tablet Take 1,000 Units by mouth daily.    . fluticasone (FLONASE) 50 MCG/ACT nasal spray Place 2 sprays into both nostrils daily. 16 Henry 11  . imipramine (TOFRANIL) 25 MG tablet Take 25 mg by mouth daily.    . lansoprazole (PREVACID) 15 MG capsule Take 15 mg by mouth daily.    Marland Kitchen levothyroxine (SYNTHROID, LEVOTHROID) 25 MCG tablet Take 25  mcg by mouth daily.    Marland Kitchen loratadine (CLARITIN) 10 MG tablet Take 10 mg by mouth daily as needed for allergies.    . Multiple Vitamins-Minerals (MULTIVITAMIN WITH MINERALS) tablet Take 1 tablet by mouth daily.    Marland Kitchen OVER THE COUNTER MEDICATION 500 mg. Tumeric    . polyethylene glycol powder (GLYCOLAX/MIRALAX) powder 255 grams one bottle for colonoscopy prep 255 Henry 0   No current facility-administered medications for this visit.    Review of Systems Review of Systems  Constitutional: Negative.   Respiratory: Negative.   Cardiovascular: Negative.   Gastrointestinal: Negative.     Blood pressure 120/72, pulse 78, resp. rate 12, height 5\' 6"  (1.676 m), weight 153 lb (69.4 kg).  Physical Exam Physical Exam  Constitutional: She is oriented to person, place, and time. She appears well-developed and well-nourished.  Eyes: Conjunctivae are normal. No scleral icterus.  Cardiovascular: Normal rate, regular rhythm and normal heart sounds.   Pulmonary/Chest: Effort normal and breath sounds normal.  Abdominal: Soft. Bowel sounds are normal.  No hernia  Lymphadenopathy:    She has no cervical adenopathy.  Neurological: She is alert and oriented to person, place, and time.  Skin: Skin is warm and dry.    Data Reviewed Previous note.  Assessment  Family history of colon cancer.    Plan    Colonoscopy with possible biopsy/polypectomy prn: Information regarding the procedure, including its potential risks and complications (including but not limited to perforation of the bowel, which may require emergency surgery to repair, and bleeding) was verbally given to the patient. Educational information regarding lower intestinal endoscopy was given to the patient. Written instructions for how to complete the bowel prep using Miralax were provided. The importance of drinking ample fluids to avoid dehydration as a result of the prep emphasized.    Patient has been scheduled for a colonoscopy on  03-05-15 at [facility]  QPR:[id number]     Martha Henry 02/05/2015, 7:55 AM

## 2015-02-05 ENCOUNTER — Encounter: Payer: Self-pay | Admitting: General Surgery

## 2015-02-10 ENCOUNTER — Telehealth: Payer: Self-pay | Admitting: *Deleted

## 2015-02-10 NOTE — Telephone Encounter (Signed)
Patient called the office to ask if Dr. Jamal Collin had any other colonoscopy dates prior to her scheduled colonoscopy on 03-05-15. This was again reviewed with the patient.   Also, patient states that she takes a generic over the counter probiotic daily. Patient wanted to make sure that this would not interfere with colonoscopy in any way. She was instructed that this would not affect anything.  Patient to call the office back if she would like to change colonoscopy date.

## 2015-02-12 ENCOUNTER — Telehealth: Payer: Self-pay | Admitting: *Deleted

## 2015-02-12 NOTE — Telephone Encounter (Signed)
Message for patient to call the office.   Patient called the answering service and left a message stating that she needs to change the date of her colonoscopy.

## 2015-02-12 NOTE — Telephone Encounter (Signed)
Patient called the office to reschedule her colonoscopy from 03-05-15 to 02-26-15 at [facility].    Trish in Endoscopy has been notified of date change.

## 2015-02-20 ENCOUNTER — Other Ambulatory Visit: Payer: Self-pay | Admitting: General Surgery

## 2015-02-25 ENCOUNTER — Encounter: Payer: Self-pay | Admitting: *Deleted

## 2015-02-26 ENCOUNTER — Encounter
Admission: RE | Disposition: A | Discharge status: Home / Self Care | Payer: Self-pay | Source: Ambulatory Visit | Attending: General Surgery

## 2015-02-26 ENCOUNTER — Ambulatory Visit: Payer: 59 | Admitting: Anesthesiology

## 2015-02-26 ENCOUNTER — Encounter: Payer: Self-pay | Admitting: *Deleted

## 2015-02-26 ENCOUNTER — Ambulatory Visit
Admission: RE | Admit: 2015-02-26 | Discharge: 2015-02-26 | Disposition: A | Discharge status: Home / Self Care | Payer: 59 | Source: Ambulatory Visit | Attending: General Surgery | Admitting: General Surgery

## 2015-02-26 DIAGNOSIS — Z886 Allergy status to analgesic agent status: Secondary | ICD-10-CM | POA: Diagnosis not present

## 2015-02-26 DIAGNOSIS — Z800 Family history of malignant neoplasm of digestive organs: Secondary | ICD-10-CM | POA: Insufficient documentation

## 2015-02-26 DIAGNOSIS — Z79899 Other long term (current) drug therapy: Secondary | ICD-10-CM | POA: Diagnosis not present

## 2015-02-26 DIAGNOSIS — Z881 Allergy status to other antibiotic agents status: Secondary | ICD-10-CM | POA: Insufficient documentation

## 2015-02-26 DIAGNOSIS — Z1211 Encounter for screening for malignant neoplasm of colon: Secondary | ICD-10-CM

## 2015-02-26 DIAGNOSIS — D125 Benign neoplasm of sigmoid colon: Secondary | ICD-10-CM

## 2015-02-26 DIAGNOSIS — Z9049 Acquired absence of other specified parts of digestive tract: Secondary | ICD-10-CM | POA: Diagnosis not present

## 2015-02-26 HISTORY — DX: Gastro-esophageal reflux disease without esophagitis: K21.9

## 2015-02-26 HISTORY — PX: COLONOSCOPY WITH PROPOFOL: SHX5780

## 2015-02-26 HISTORY — DX: Hypothyroidism, unspecified: E03.9

## 2015-02-26 SURGERY — COLONOSCOPY WITH PROPOFOL
Anesthesia: Monitor Anesthesia Care

## 2015-02-26 MED ORDER — LACTATED RINGERS IV SOLN
INTRAVENOUS | Status: DC | PRN
  Administered 2015-02-26: 09:00:00 via INTRAVENOUS

## 2015-02-26 MED ORDER — PROPOFOL 500 MG/50ML IV EMUL
INTRAVENOUS | Status: DC | PRN
  Administered 2015-02-26: 100 ug/kg/min via INTRAVENOUS

## 2015-02-26 MED ORDER — SODIUM CHLORIDE 0.9 % IV SOLN
INTRAVENOUS | Status: DC
  Administered 2015-02-26: 1000 mL via INTRAVENOUS

## 2015-02-26 MED ORDER — PROPOFOL 10 MG/ML IV BOLUS
INTRAVENOUS | Status: DC | PRN
  Administered 2015-02-26 (×2): 50 mg via INTRAVENOUS

## 2015-02-26 NOTE — Interval H&P Note (Signed)
History and Physical Interval Note:  02/26/2015 8:41 AM  Martha Henry  has presented today for surgery, with the diagnosis of FH COLON CA  The various methods of treatment have been discussed with the patient and family. After consideration of risks, benefits and other options for treatment, the patient has consented to  Procedure(s): COLONOSCOPY WITH PROPOFOL (N/A) as a surgical intervention .  The patient's history has been reviewed, patient examined, no change in status, stable for surgery.  I have reviewed the patient's chart and labs.  Questions were answered to the patient's satisfaction.     Davinity Fanara G

## 2015-02-26 NOTE — H&P (View-Only) (Signed)
Patient ID: Martha Henry, female   DOB: 1966/05/03, 49 y.o.   MRN: [id number]  Chief Complaint  Patient presents with  . Colonoscopy    HPI Martha Henry is a 49 y.o. female here today for an evaluation for a colonoscopy. She reports no GI issues. Moves bowel regularly. She has never had a colonoscopy. Her father had colon cancer at age 42. She has chronic headaches and sees Dr. Tami Ribas. She has thyroid issues and sees Dr. Eddie Dibbles. She is taking Synthroid.  HPI  Past Medical History  Diagnosis Date  . ITP (idiopathic thrombocytopenic purpura) 1990  . History of abnormal mammogram     seen by Dr. Jamal Collin    Past Surgical History  Procedure Laterality Date  . Cholecystectomy  2009  . Appendectomy  2009  . Tonsillectomy  1975  . Abdominal hysterectomy  2003    partial/only ovaries remain    Family History  Problem Relation Age of Onset  . Cancer Mother     breast  . Breast cancer Maternal Aunt   . Cancer Maternal Aunt     breast  . Colon cancer Father 83  . Cancer Father     colon  . Lung cancer Maternal Grandfather   . Cervical cancer Maternal Grandmother     Social History Social History  Substance Use Topics  . Smoking status: Never Smoker   . Smokeless tobacco: Never Used  . Alcohol Use: No    Allergies  Allergen Reactions  . Erythromycin Other (See Comments)    Chest pain  . Aspirin Other (See Comments)    Platelet problems    Current Outpatient Prescriptions  Medication Sig Dispense Refill  . Calcium-Vitamin D-Vitamin K (VIACTIV) [id number] MG-UNT-MCG CHEW Chew by mouth.    . cholecalciferol (VITAMIN D) 1000 UNITS tablet Take 1,000 Units by mouth daily.    . fluticasone (FLONASE) 50 MCG/ACT nasal spray Place 2 sprays into both nostrils daily. 16 g 11  . imipramine (TOFRANIL) 25 MG tablet Take 25 mg by mouth daily.    . lansoprazole (PREVACID) 15 MG capsule Take 15 mg by mouth daily.    Marland Kitchen levothyroxine (SYNTHROID, LEVOTHROID) 25 MCG tablet Take 25  mcg by mouth daily.    Marland Kitchen loratadine (CLARITIN) 10 MG tablet Take 10 mg by mouth daily as needed for allergies.    . Multiple Vitamins-Minerals (MULTIVITAMIN WITH MINERALS) tablet Take 1 tablet by mouth daily.    Marland Kitchen OVER THE COUNTER MEDICATION 500 mg. Tumeric    . polyethylene glycol powder (GLYCOLAX/MIRALAX) powder 255 grams one bottle for colonoscopy prep 255 g 0   No current facility-administered medications for this visit.    Review of Systems Review of Systems  Constitutional: Negative.   Respiratory: Negative.   Cardiovascular: Negative.   Gastrointestinal: Negative.     Blood pressure 120/72, pulse 78, resp. rate 12, height 5\' 6"  (1.676 m), weight 153 lb (69.4 kg).  Physical Exam Physical Exam  Constitutional: She is oriented to person, place, and time. She appears well-developed and well-nourished.  Eyes: Conjunctivae are normal. No scleral icterus.  Cardiovascular: Normal rate, regular rhythm and normal heart sounds.   Pulmonary/Chest: Effort normal and breath sounds normal.  Abdominal: Soft. Bowel sounds are normal.  No hernia  Lymphadenopathy:    She has no cervical adenopathy.  Neurological: She is alert and oriented to person, place, and time.  Skin: Skin is warm and dry.    Data Reviewed Previous note.  Assessment  Family history of colon cancer.    Plan    Colonoscopy with possible biopsy/polypectomy prn: Information regarding the procedure, including its potential risks and complications (including but not limited to perforation of the bowel, which may require emergency surgery to repair, and bleeding) was verbally given to the patient. Educational information regarding lower intestinal endoscopy was given to the patient. Written instructions for how to complete the bowel prep using Miralax were provided. The importance of drinking ample fluids to avoid dehydration as a result of the prep emphasized.    Patient has been scheduled for a colonoscopy on  03-05-15 at [facility].  AFB:[id number],[id number]     SANKAR,SEEPLAPUTHUR G 02/05/2015, 7:55 AM

## 2015-02-26 NOTE — Transfer of Care (Signed)
Immediate Anesthesia Transfer of Care Note  Patient: Martha Henry  Procedure(s) Performed: Procedure(s): COLONOSCOPY WITH PROPOFOL (N/A)  Patient Location: PACU and Endoscopy Unit  Anesthesia Type:General  Level of Consciousness: awake, alert  and oriented  Airway & Oxygen Therapy: Patient Spontanous Breathing and Patient connected to nasal cannula oxygen  Post-op Assessment: Report given to RN and Post -op Vital signs reviewed and stable  Post vital signs: stable  Last Vitals:  Filed Vitals:   02/26/15 0822  BP: 137/89  Pulse: 92  Temp: 36.7 C  Resp: 18    Complications: No apparent anesthesia complications

## 2015-02-26 NOTE — Op Note (Signed)
[facility] Gastroenterology Patient Name: Martha Henry Procedure Date: 02/26/2015 8:52 AM MRN: [id number] Account #: [id number] Date of Birth: 01-24-1966 Admit Type: Outpatient Age: 50 Room: [facility] ENDO ROOM 1 Gender: Female Note Status: Finalized Procedure:         Colonoscopy Indications:       Screening for colorectal malignant neoplasm Providers:         Orlie Pollen, MD Referring MD:      Arnetha Courser (Referring MD) Medicines:         Propofol per Anesthesia Complications:     No immediate complications. Procedure:         Pre-Anesthesia Assessment:                    - Using IV propofol under the supervision of an                     anesthesiologist was determined to be medically necessary                     for this procedure based on review of the patient's                     medical history, medications, and prior anesthesia history.                    After obtaining informed consent, the colonoscope was                     passed under direct vision. Throughout the procedure, the                     patient's blood pressure, pulse, and oxygen saturations                     were monitored continuously. The Colonoscope was                     introduced through the anus and advanced to the the                     terminal ileum, with identification of the ileocecal                     valve. The colonoscopy was performed with moderate                     difficulty due to poor bowel prep. Successful completion                     of the procedure was aided by lavage. The patient                     tolerated the procedure well. The quality of the bowel                     preparation was poor. Findings:      The perianal and digital rectal examinations were normal.      A moderate amount of stool was found in the rectum. Lavage of the area       was performed, resulting in clearance with fair visualization.      Stool was found in the  entire colon. Lavage of the area was performed,       resulting in  clearance with fair visualization.      A 5 mm polyp was found in the sigmoid colon. The polyp was sessile. The       polyp was removed with a cold biopsy forceps. The polyp was removed with       a hot biopsy forceps. Resection and retrieval were complete.       Verification of patient identification for the specimen was done using       the patient's name and birth date. Estimated blood loss was minimal.      The exam was otherwise without abnormality on direct and retroflexion       views. Impression:        - Preparation of the colon was poor.                    - Stool in the rectum.                    - Stool in the entire examined colon.                    - One 5 mm polyp in the sigmoid colon. Resected and                     retrieved.                    - The examination was otherwise normal on direct and                     retroflexion views. Recommendation:    - Repeat colonoscopy in 5 years for surveillance. Procedure Code(s): --- Professional ---                    [phone], Colonoscopy, flexible; with removal of tumor(s),                     polyp(s), or other lesion(s) by hot biopsy forceps CPT copyright 2014 [employer]. All rights reserved. The codes documented in this report are preliminary and upon coder review may  be revised to meet current compliance requirements. Orlie Pollen, MD 02/26/2015 9:31:25 AM This report has been signed electronically. Number of Addenda: 0 Note Initiated On: 02/26/2015 8:52 AM Scope Withdrawal Time: 0 hours 14 minutes 38 seconds  Total Procedure Duration: 0 hours 27 minutes 31 seconds       [facility]

## 2015-02-26 NOTE — Anesthesia Preprocedure Evaluation (Signed)
Anesthesia Evaluation  Patient identified by MRN, date of birth, ID band Patient awake    Reviewed: Allergy & Precautions, NPO status , Patient's Chart, lab work & pertinent test results, reviewed documented beta blocker date and time   Airway Mallampati: II  TM Distance: >3 FB Neck ROM: Full    Dental  (+) Teeth Intact   Pulmonary    breath sounds clear to auscultation       Cardiovascular  Rhythm:Regular Rate:Normal     Neuro/Psych    GI/Hepatic   Endo/Other    Renal/GU      Musculoskeletal   Abdominal   Peds  Hematology   Anesthesia Other Findings   Reproductive/Obstetrics                             Anesthesia Physical Anesthesia Plan  ASA: II  Anesthesia Plan: MAC   Post-op Pain Management:    Induction:   Airway Management Planned: Nasal Cannula  Additional Equipment:   Intra-op Plan:   Post-operative Plan:   Informed Consent: I have reviewed the patients History and Physical, chart, labs and discussed the procedure including the risks, benefits and alternatives for the proposed anesthesia with the patient or authorized representative who has indicated his/her understanding and acceptance.     Plan Discussed with: CRNA and Surgeon  Anesthesia Plan Comments:         Anesthesia Quick Evaluation

## 2015-02-27 ENCOUNTER — Encounter: Payer: Self-pay | Admitting: General Surgery

## 2015-02-27 LAB — SURGICAL PATHOLOGY

## 2015-02-28 NOTE — Anesthesia Postprocedure Evaluation (Signed)
  Anesthesia Post-op Note  Patient: Martha Henry  Procedure(s) Performed: Procedure(s): COLONOSCOPY WITH PROPOFOL (N/A)  Anesthesia type:MAC  Patient location: PACU  Post pain: Pain level controlled  Post assessment: Post-op Vital signs reviewed, Patient's Cardiovascular Status Stable, Respiratory Function Stable, Patent Airway and No signs of Nausea or vomiting  Post vital signs: Reviewed and stable  Last Vitals:  Filed Vitals:   02/26/15 0955  BP:   Pulse: 90  Temp:   Resp:     Level of consciousness: awake, alert  and patient cooperative  Complications: No apparent anesthesia complications

## 2015-03-04 ENCOUNTER — Telehealth: Payer: Self-pay | Admitting: *Deleted

## 2015-03-04 NOTE — Telephone Encounter (Signed)
-----   Message from Christene Lye, MD sent at 02/28/2015  8:21 AM EDT ----- Path showed a tubular adenoma. She will need repeat colonoscopy in 3-5 yrs.

## 2015-03-04 NOTE — Telephone Encounter (Signed)
Notified patient as instructed, patient pleased. Discussed follow-up appointments, patient agrees. 3 yr recall.

## 2015-04-23 ENCOUNTER — Encounter: Payer: Self-pay | Admitting: *Deleted

## 2015-04-30 NOTE — Discharge Instructions (Signed)
[facility] °[facility] °ENDOSCOPIC SINUS SURGERY °[facility] ° °What is Functional Endoscopic Sinus Surgery? ° The Surgery involves making the natural openings of the sinuses larger by removing the bony partitions that separate the sinuses from the nasal cavity.  The natural sinus lining is preserved as much as possible to allow the sinuses to resume normal function after the surgery.  In some patients nasal polyps (excessively swollen lining of the sinuses) may be removed to relieve obstruction of the sinus openings.  The surgery is performed through the nose using lighted scopes, which eliminates the need for incisions on the face.  A septoplasty is a different procedure which is sometimes performed with sinus surgery.  It involves straightening the boy partition that separates the two sides of your nose.  A crooked or deviated septum may need repair if is obstructing the sinuses or nasal airflow.  Turbinate reduction is also often performed during sinus surgery.  The turbinates are bony proturberances from the side walls of the nose which swell and can obstruct the nose in patients with sinus and allergy problems.  Their size can be surgically reduced to help relieve nasal obstruction. ° °What Can Sinus Surgery Do For Me? ° Sinus surgery can reduce the frequency of sinus infections requiring antibiotic treatment.  This can provide improvement in nasal congestion, post-nasal drainage, facial pressure and nasal obstruction.  Surgery will NOT prevent you from ever having an infection again, so it usually only for patients who get infections 4 or more times yearly requiring antibiotics, or for infections that do not clear with antibiotics.  It will not cure nasal allergies, so patients with allergies may still require medication to treat their allergies after surgery. Surgery may improve headaches related to sinusitis, however, some people will continue to  require medication to control sinus headaches related to allergies.  Surgery will do nothing for other forms of headache (migraine, tension or cluster). ° °What Are the Risks of Endoscopic Sinus Surgery? ° Current techniques allow surgery to be performed safely with little risk, however, there are rare complications that patients should be aware of.  Because the sinuses are located around the eyes, there is risk of eye injury, including blindness, though again, this would be quite rare. This is usually a result of bleeding behind the eye during surgery, which puts the vision oat risk, though there are treatments to protect the vision and prevent permanent disrupted by surgery causing a leak of the spinal fluid that surrounds the brain.  More serious complications would include bleeding inside the brain cavity or damage to the brain.  Again, all of these complications are uncommon, and spinal fluid leaks can be safely managed surgically if they occur.  The most common complication of sinus surgery is bleeding from the nose, which may require packing or cauterization of the nose.  Continued sinus have polyps may experience recurrence of the polyps requiring revision surgery.  Alterations of sense of smell or injury to the tear ducts are also rare complications.  ° °What is the Surgery Like, and what is the Recovery? ° The Surgery usually takes a couple of hours to perform, and is usually performed under a general anesthetic (completely asleep).  Patients are usually discharged home after a couple of hours.  Sometimes during surgery it is necessary to pack the nose to control bleeding, and the packing is left in place for 24 - 48 hours, and removed by your surgeon.    If a septoplasty was performed during the procedure, there is often a splint placed which must be removed after 5-7 days.   °Discomfort: Pain is usually mild to moderate, and can be controlled by prescription pain medication or acetaminophen (Tylenol).   Aspirin, Ibuprofen (Advil, Motrin), or Naprosyn (Aleve) should be avoided, as they can cause increased bleeding.  Most patients feel sinus pressure like they have a bad head cold for several days.  Sleeping with your head elevated can help reduce swelling and facial pressure, as can ice packs over the face.  A humidifier may be helpful to keep the mucous and blood from drying in the nose.  ° °Diet: There are no specific diet restrictions, however, you should generally start with clear liquids and a light diet of bland foods because the anesthetic can cause some nausea.  Advance your diet depending on how your stomach feels.  Taking your pain medication with food will often help reduce stomach upset which pain medications can cause. ° °Nasal Saline Irrigation: It is important to remove blood clots and dried mucous from the nose as it is healing.  This is done by having you irrigate the nose at least 3 - 4 times daily with a salt water solution.  We recommend using NeilMed Sinus Rinse (available at the drug store).  Fill the squeeze bottle with the solution, bend over a sink, and insert the tip of the squeeze bottle into the nose ½ of an inch.  Point the tip of the squeeze bottle towards the inside corner of the eye on the same side your irrigating.  Squeeze the bottle and gently irrigate the nose.  If you bend forward as you do this, most of the fluid will flow back out of the nose, instead of down your throat.   The solution should be warm, near body temperature, when you irrigate.   Each time you irrigate, you should use a full squeeze bottle.  ° °Note that if you are instructed to use Nasal Steroid Sprays at any time after your surgery, irrigate with saline BEFORE using the steroid spray, so you do not wash it all out of the nose. °Another product, Nasal Saline Gel (such as AYR Nasal Saline Gel) can be applied in each nostril 3 - 4 times daily to moisture the nose and reduce scabbing or crusting. ° °Bleeding:   Bloody drainage from the nose can be expected for several days, and patients are instructed to irrigate their nose frequently with salt water to help remove mucous and blood clots.  The drainage may be dark red or brown, though some fresh blood may be seen intermittently, especially after irrigation.  Do not blow you nose, as bleeding may occur. If you must sneeze, keep your mouth open to allow air to escape through your mouth. ° °If heavy bleeding occurs: Irrigate the nose with saline to rinse out clots, then spray the nose 3 - 4 times with Afrin Nasal Decongestant Spray.  The spray will constrict the blood vessels to slow bleeding.  Pinch the lower half of your nose shut to apply pressure, and lay down with your head elevated.  Ice packs over the nose may help as well. If bleeding persists despite these measures, you should notify your doctor.  Do not use the Afrin routinely to control nasal congestion after surgery, as it can result in worsening congestion and may affect healing.  ° ° ° °Activity: Return to work varies among patients. Most patients will be   out of work at least 5 - 7 days to recover.  Patient may return to work after they are off of narcotic pain medication, and feeling well enough to perform the functions of their job.  Patients must avoid heavy lifting (over 10 pounds) or strenuous physical for 2 weeks after surgery, so your employer may need to assign you to light duty, or keep you out of work longer if light duty is not possible.  NOTE: you should not drive, operate dangerous machinery, do any mentally demanding tasks or make any important legal or financial decisions while on narcotic pain medication and recovering from the general anesthetic.  °  °Call Your Doctor Immediately if You Have Any of the Following: °1. Bleeding that you cannot control with the above measures °2. Loss of vision, double vision, bulging of the eye or black eyes. °3. Fever over 101 degrees °4. Neck stiffness with  severe headache, fever, nausea and change in mental state. °You are always encourage to call anytime with concerns, however, please call with requests for pain medication refills during office hours. ° °Office Endoscopy: During follow-up visits your doctor will remove any packing or splints that may have been placed and evaluate and clean your sinuses endoscopically.  Topical anesthetic will be used to make this as comfortable as possible, though you may want to take your pain medication prior to the visit.  How often this will need to be done varies from patient to patient.  After complete recovery from the surgery, you may need follow-up endoscopy from time to time, particularly if there is concern of recurrent infection or nasal polyps. ° °General Anesthesia, Adult, Care After °Refer to this sheet in the next few weeks. These instructions provide you with information on caring for yourself after your procedure. Your health care provider may also give you more specific instructions. Your treatment has been planned according to current medical practices, but problems sometimes occur. Call your health care provider if you have any problems or questions after your procedure. °WHAT TO EXPECT AFTER THE PROCEDURE °After the procedure, it is typical to experience: °· Sleepiness. °· Nausea and vomiting. °HOME CARE INSTRUCTIONS °· For the first 24 hours after general anesthesia: °¨ Have a responsible person with you. °¨ Do not drive a car. If you are alone, do not take public transportation. °¨ Do not drink alcohol. °¨ Do not take medicine that has not been prescribed by your health care provider. °¨ Do not sign important papers or make important decisions. °¨ You may resume a normal diet and activities as directed by your health care provider. °· Change bandages (dressings) as directed. °· If you have questions or problems that seem related to general anesthesia, call the hospital and ask for the anesthetist or  anesthesiologist on call. °SEEK MEDICAL CARE IF: °· You have nausea and vomiting that continue the day after anesthesia. °· You develop a rash. °SEEK IMMEDIATE MEDICAL CARE IF:  °· You have difficulty breathing. °· You have chest pain. °· You have any allergic problems. °  °This information is not intended to replace advice given to you by your health care provider. Make sure you discuss any questions you have with your health care provider. °  °Document Released: 07/26/2000 Document Revised: 05/10/2014 Document Reviewed: 08/18/2011 °Elsevier Interactive Patient Education ©2016 [employer] ° °

## 2015-05-02 ENCOUNTER — Ambulatory Visit: Payer: Commercial Managed Care - HMO | Admitting: Student in an Organized Health Care Education/Training Program

## 2015-05-02 ENCOUNTER — Encounter
Admission: RE | Disposition: A | Discharge status: Home / Self Care | Payer: Self-pay | Source: Ambulatory Visit | Attending: Unknown Physician Specialty

## 2015-05-02 ENCOUNTER — Ambulatory Visit
Admission: RE | Admit: 2015-05-02 | Discharge: 2015-05-02 | Disposition: A | Discharge status: Home / Self Care | Payer: Commercial Managed Care - HMO | Source: Ambulatory Visit | Attending: Unknown Physician Specialty | Admitting: Unknown Physician Specialty

## 2015-05-02 DIAGNOSIS — Z886 Allergy status to analgesic agent status: Secondary | ICD-10-CM | POA: Insufficient documentation

## 2015-05-02 DIAGNOSIS — J343 Hypertrophy of nasal turbinates: Secondary | ICD-10-CM | POA: Diagnosis not present

## 2015-05-02 DIAGNOSIS — J3489 Other specified disorders of nose and nasal sinuses: Secondary | ICD-10-CM | POA: Diagnosis not present

## 2015-05-02 DIAGNOSIS — E039 Hypothyroidism, unspecified: Secondary | ICD-10-CM | POA: Diagnosis not present

## 2015-05-02 DIAGNOSIS — R51 Headache: Secondary | ICD-10-CM | POA: Diagnosis not present

## 2015-05-02 DIAGNOSIS — Z881 Allergy status to other antibiotic agents status: Secondary | ICD-10-CM | POA: Diagnosis not present

## 2015-05-02 DIAGNOSIS — K219 Gastro-esophageal reflux disease without esophagitis: Secondary | ICD-10-CM | POA: Insufficient documentation

## 2015-05-02 DIAGNOSIS — D693 Immune thrombocytopenic purpura: Secondary | ICD-10-CM | POA: Diagnosis not present

## 2015-05-02 DIAGNOSIS — J342 Deviated nasal septum: Secondary | ICD-10-CM | POA: Diagnosis not present

## 2015-05-02 HISTORY — PX: NASAL TURBINATE REDUCTION: SHX2072

## 2015-05-02 HISTORY — DX: Headache, unspecified: R51.9

## 2015-05-02 HISTORY — DX: Headache: R51

## 2015-05-02 HISTORY — DX: Presence of spectacles and contact lenses: Z97.3

## 2015-05-02 HISTORY — PX: SEPTOPLASTY: SHX2393

## 2015-05-02 SURGERY — SEPTOPLASTY, NOSE
Anesthesia: General | Wound class: Clean Contaminated

## 2015-05-02 MED ORDER — OXYCODONE HCL 5 MG PO TABS: 5.0000 mg | ORAL_TABLET | Freq: Once | ORAL | Status: DC | PRN

## 2015-05-02 MED ORDER — ACETAMINOPHEN 10 MG/ML IV SOLN
1000.0000 mg | Freq: Once | INTRAVENOUS | Status: AC
  Administered 2015-05-02: 1000 mg via INTRAVENOUS

## 2015-05-02 MED ORDER — PROPOFOL 10 MG/ML IV BOLUS
INTRAVENOUS | Status: DC | PRN
  Administered 2015-05-02: 130 mg via INTRAVENOUS

## 2015-05-02 MED ORDER — HYDROMORPHONE HCL 1 MG/ML IJ SOLN: 0.2500 mg | INTRAMUSCULAR | Status: DC | PRN

## 2015-05-02 MED ORDER — LACTATED RINGERS IV SOLN
INTRAVENOUS | Status: DC
  Administered 2015-05-02: 10:00:00 via INTRAVENOUS

## 2015-05-02 MED ORDER — ONDANSETRON HCL 4 MG/2ML IJ SOLN
INTRAMUSCULAR | Status: DC | PRN
  Administered 2015-05-02: 4 mg via INTRAVENOUS

## 2015-05-02 MED ORDER — DEXAMETHASONE SODIUM PHOSPHATE 4 MG/ML IJ SOLN
INTRAMUSCULAR | Status: DC | PRN
Start: 2015-05-02 — End: 2015-05-02
  Administered 2015-05-02: 10 mg via INTRAVENOUS

## 2015-05-02 MED ORDER — PROMETHAZINE HCL 25 MG/ML IJ SOLN: 6.2500 mg | INTRAMUSCULAR | Status: DC | PRN

## 2015-05-02 MED ORDER — FENTANYL CITRATE (PF) 100 MCG/2ML IJ SOLN
INTRAMUSCULAR | Status: DC | PRN
  Administered 2015-05-02: 100 ug via INTRAVENOUS

## 2015-05-02 MED ORDER — SULFAMETHOXAZOLE-TRIMETHOPRIM 400-80 MG PO TABS: 1.0000 | ORAL_TABLET | Freq: Two times a day (BID) | ORAL | Status: DC

## 2015-05-02 MED ORDER — LIDOCAINE HCL (CARDIAC) 20 MG/ML IV SOLN
INTRAVENOUS | Status: DC | PRN
  Administered 2015-05-02: 40 mg via INTRAVENOUS

## 2015-05-02 MED ORDER — LIDOCAINE-EPINEPHRINE 1 %-1:100000 IJ SOLN
INTRAMUSCULAR | Status: DC | PRN
  Administered 2015-05-02: 12 mL

## 2015-05-02 MED ORDER — GLYCOPYRROLATE 0.2 MG/ML IJ SOLN
INTRAMUSCULAR | Status: DC | PRN
  Administered 2015-05-02: 0.1 mg via INTRAVENOUS

## 2015-05-02 MED ORDER — LIDOCAINE HCL 4 % MT SOLN
OROMUCOSAL | Status: DC | PRN
  Administered 2015-05-02: 3 mL via TOPICAL

## 2015-05-02 MED ORDER — OXYCODONE HCL 5 MG/5ML PO SOLN: 5.0000 mg | Freq: Once | ORAL | Status: DC | PRN

## 2015-05-02 MED ORDER — PHENYLEPHRINE HCL 10 MG/ML IJ SOLN
INTRAMUSCULAR | Status: DC | PRN
  Administered 2015-05-02 (×2): 50 ug via INTRAVENOUS

## 2015-05-02 MED ORDER — HYDROCODONE-ACETAMINOPHEN 5-300 MG PO TABS: 1.0000 | ORAL_TABLET | ORAL | Status: DC | PRN

## 2015-05-02 MED ORDER — PHENYLEPHRINE HCL 0.5 % NA SOLN
NASAL | Status: DC | PRN
  Administered 2015-05-02: 10 mL via TOPICAL

## 2015-05-02 MED ORDER — OXYMETAZOLINE HCL 0.05 % NA SOLN
6.0000 | Freq: Two times a day (BID) | NASAL | Status: DC
  Administered 2015-05-02: 6 via NASAL

## 2015-05-02 MED ORDER — MEPERIDINE HCL 25 MG/ML IJ SOLN: 6.2500 mg | INTRAMUSCULAR | Status: DC | PRN

## 2015-05-02 MED ORDER — SUCCINYLCHOLINE CHLORIDE 20 MG/ML IJ SOLN
INTRAMUSCULAR | Status: DC | PRN
  Administered 2015-05-02: 80 mg via INTRAVENOUS

## 2015-05-02 MED ORDER — MIDAZOLAM HCL 5 MG/5ML IJ SOLN
INTRAMUSCULAR | Status: DC | PRN
  Administered 2015-05-02: 2 mg via INTRAVENOUS

## 2015-05-02 SURGICAL SUPPLY — 30 items
BLADE SURG 15 STRL LF DISP TIS (BLADE) IMPLANT
BLADE SURG 15 STRL SS (BLADE)
COAG SUCT 10F 3.5MM HAND CTRL (MISCELLANEOUS) ×4 IMPLANT
DRAPE HEAD BAR (DRAPES) ×4 IMPLANT
DRESSING NASL FOAM PST OP SINU (MISCELLANEOUS) ×4 IMPLANT
DRSG NASAL FOAM POST OP SINU (MISCELLANEOUS) ×8
GLOVE BIO SURGEON STRL SZ7.5 (GLOVE) ×8 IMPLANT
HANDLE YANKAUER SUCT BULB TIP (MISCELLANEOUS) ×4 IMPLANT
KIT ROOM TURNOVER OR (KITS) ×4 IMPLANT
NEEDLE HYPO 25GX1X1/2 BEV (NEEDLE) ×4 IMPLANT
NS IRRIG 500ML POUR BTL (IV SOLUTION) ×4 IMPLANT
PACK DRAPE NASAL/ENT (PACKS) ×4 IMPLANT
PAD GROUND ADULT SPLIT (MISCELLANEOUS) ×4 IMPLANT
SOL ANTI-FOG 6CC FOG-OUT (MISCELLANEOUS) ×2 IMPLANT
SOL FOG-OUT ANTI-FOG 6CC (MISCELLANEOUS) ×2
SPLINT NASAL SEPTAL BLV .25 LG (MISCELLANEOUS) IMPLANT
SPLINT NASAL SEPTAL BLV .50 ST (MISCELLANEOUS) ×4 IMPLANT
SPONGE NEURO XRAY DETECT 1X3 (DISPOSABLE) ×4 IMPLANT
STRAP BODY AND KNEE 60X3 (MISCELLANEOUS) ×4 IMPLANT
SUT CHROMIC 3-0 (SUTURE) ×2
SUT CHROMIC 3-0 KS 27XMFL CR (SUTURE) ×2
SUT CHROMIC 5-0 (SUTURE)
SUT CHROMIC 5-0 P2 18XMFL CR (SUTURE)
SUT ETHILON 3-0 KS 30 BLK (SUTURE) ×4 IMPLANT
SUT PLAIN GUT 4-0 (SUTURE) IMPLANT
SUTURE CHRMC 3-0 KS 27XMFL CR (SUTURE) ×2 IMPLANT
SUTURE CHRMC 5-0 P2 18XMF CR (SUTURE) IMPLANT
SYRINGE 10CC LL (SYRINGE) ×4 IMPLANT
TOWEL OR 17X26 4PK STRL BLUE (TOWEL DISPOSABLE) ×4 IMPLANT
WATER STERILE IRR 500ML POUR (IV SOLUTION) ×4 IMPLANT

## 2015-05-02 NOTE — Transfer of Care (Signed)
Immediate Anesthesia Transfer of Care Note  Patient: Martha Henry  Procedure(s) Performed: Procedure(s): SEPTOPLASTY (N/A) TURBINATE REDUCTION/SUBMUCOSAL RESECTION (Bilateral)  Patient Location: PACU  Anesthesia Type: General  Level of Consciousness: awake, alert  and patient cooperative  Airway and Oxygen Therapy: Patient Spontanous Breathing and Patient connected to supplemental oxygen  Post-op Assessment: Post-op Vital signs reviewed, Patient's Cardiovascular Status Stable, Respiratory Function Stable, Patent Airway and No signs of Nausea or vomiting  Post-op Vital Signs: Reviewed and stable  Complications: No apparent anesthesia complications

## 2015-05-02 NOTE — Anesthesia Postprocedure Evaluation (Signed)
Anesthesia Post Note  Patient: Martha Henry  Procedure(s) Performed: Procedure(s) (LRB): SEPTOPLASTY (N/A) TURBINATE REDUCTION/SUBMUCOSAL RESECTION (Bilateral)  Patient location during evaluation: PACU Anesthesia Type: General Level of consciousness: awake and alert Pain management: pain level controlled Vital Signs Assessment: post-procedure vital signs reviewed and stable Respiratory status: spontaneous breathing Cardiovascular status: stable Postop Assessment: no signs of nausea or vomiting and adequate PO intake Anesthetic complications: no    Estill Batten

## 2015-05-02 NOTE — Anesthesia Procedure Notes (Signed)
Procedure Name: Intubation Date/Time: 05/02/2015 11:27 AM Performed by: Cameron Ali Pre-anesthesia Checklist: Patient identified, Emergency Drugs available, Suction available, Patient being monitored and Timeout performed Patient Re-evaluated:Patient Re-evaluated prior to inductionOxygen Delivery Method: Circle system utilized Preoxygenation: Pre-oxygenation with 100% oxygen Intubation Type: IV induction Ventilation: Mask ventilation without difficulty Laryngoscope Size: Miller and 3 Grade View: Grade I Tube type: Oral Rae Tube size: 7.0 mm Number of attempts: 1 Placement Confirmation: ETT inserted through vocal cords under direct vision,  positive ETCO2 and breath sounds checked- equal and bilateral Secured at: 21 cm Tube secured with: Tape Dental Injury: Teeth and Oropharynx as per pre-operative assessment  Comments: Performed by Barbra Sarks CRNA

## 2015-05-02 NOTE — Anesthesia Preprocedure Evaluation (Signed)
Anesthesia Evaluation  Patient identified by MRN, date of birth, ID band Patient awake    Reviewed: Allergy & Precautions, NPO status , Patient's Chart, lab work & pertinent test results, reviewed documented beta blocker date and time   Airway Mallampati: I  TM Distance: >3 FB Neck ROM: Full    Dental no notable dental hx.    Pulmonary neg pulmonary ROS,    Pulmonary exam normal        Cardiovascular Normal cardiovascular exam     Neuro/Psych  Headaches,    GI/Hepatic Neg liver ROS, GERD  Controlled,  Endo/Other  Hypothyroidism   Renal/GU negative Renal ROS     Musculoskeletal negative musculoskeletal ROS (+)   Abdominal   Peds  Hematology H/O ITP   Anesthesia Other Findings   Reproductive/Obstetrics                             Anesthesia Physical Anesthesia Plan  ASA: II  Anesthesia Plan: General   Post-op Pain Management:    Induction: Intravenous  Airway Management Planned:   Additional Equipment:   Intra-op Plan:   Post-operative Plan:   Informed Consent: I have reviewed the patients History and Physical, chart, labs and discussed the procedure including the risks, benefits and alternatives for the proposed anesthesia with the patient or authorized representative who has indicated his/her understanding and acceptance.     Plan Discussed with: CRNA  Anesthesia Plan Comments:         Anesthesia Quick Evaluation

## 2015-05-02 NOTE — Op Note (Signed)
PREOPERATIVE DIAGNOSIS:  Chronic nasal obstruction.  POSTOPERATIVE DIAGNOSIS:  Chronic nasal obstruction.  SURGEON:  Roena Malady, M.D.  NAME OF PROCEDURE:  1. Nasal septoplasty. 2. Submucous resection of inferior turbinates.  OPERATIVE FINDINGS:  Severe nasal septal deformity, hypertrophy of the inferior turbinates.   DESCRIPTION OF THE PROCEDURE:  Martha Henry was identified in the holding area and taken to the operating room and placed in the supine position.  After general endotracheal anesthesia was induced, the table was turned 45 degrees and the patient was placed in a semi-Fowler position.  The nose was then topically anesthetized with Lidocaine, cotton pledgets were placed within each nostril. After approximately 5 minutes, this was removed at which time a local anesthetic of 1% Lidocaine 1:100,000 units of Epinephrine was used to inject the inferior turbinates in the nasal septum. A total of 12 ml was used. Examination of the nose showed a severe left nasal septal deformity and tremendous hypertrophied inferior turbinate.  Beginning on the right hand side a hemitransfixion incision was then created on the leading edge of the septum on the right.  A subperichondrial plane was elevated posteriorly on the left and taken back to the perpendicular plate of the ethmoid where subperiosteal plane was elevated posteriorly on the left. A large septal spur was identified on the left hand side impacting on the inferior turbinate.  An inferior rim of cartilage was removed anteriorly with care taken to leave an anterior strut to prevent nasal collapse. With this strut removed the perpendicular plate of the ethmoid was separated from the quadrangular cartilage. The large septal spur was removed.  The septum was then replaced in the midline. Reinspection through each nostril showed excellent reduction of the septal deformity. A left posterior inferior fenestration was then created to allow hematoma  drainage.  With the septoplasty completed, beginning on the left-hand side, a 15 blade was used to incise along the inferior edge of the inferior turbinate. A superior laterally based flap was then elevated. The underlying conchal bone of mucosa was excised using Knight scissors. The flap was then laid back over the turbinate stump and cauterized using suction cautery. In a similar fashion the submucous resection was performed on the right.  With the submucous resection completed bilaterally and no active bleeding, the hemitransfixion incision was then closed using two interrupted 3-0 chromic sutures.  Plastic nasal septal splints were placed within each nostril and affixed to the septum using a 3-0 nylon suture. Stammberger was then used beneath each inferior turbinate for hemostasis.    The patient tolerated the procedure well, was returned to anesthesia, extubated in the operating room, and taken to the recovery room in stable condition.    CULTURES:  None.  SPECIMENS:  None.  ESTIMATED BLOOD LOSS:  25 cc.  Martha Henry T  05/02/2015  11:53 AM

## 2015-05-02 NOTE — H&P (Signed)
  H+P  Reviewed and will be scanned in later. No changes noted. 

## 2015-07-29 ENCOUNTER — Encounter: Payer: Self-pay | Admitting: General Surgery

## 2015-07-31 ENCOUNTER — Ambulatory Visit: Payer: Commercial Managed Care - HMO | Admitting: General Surgery

## 2015-08-06 ENCOUNTER — Encounter: Payer: Self-pay | Admitting: General Surgery

## 2015-08-06 ENCOUNTER — Ambulatory Visit (INDEPENDENT_AMBULATORY_CARE_PROVIDER_SITE_OTHER): Payer: Commercial Managed Care - HMO | Admitting: General Surgery

## 2015-08-06 VITALS — BP 122/82 | HR 70 | Resp 14 | Ht 62.0 in | Wt 153.0 lb

## 2015-08-06 DIAGNOSIS — N6019 Diffuse cystic mastopathy of unspecified breast: Secondary | ICD-10-CM

## 2015-08-06 DIAGNOSIS — Z800 Family history of malignant neoplasm of digestive organs: Secondary | ICD-10-CM

## 2015-08-06 DIAGNOSIS — Z803 Family history of malignant neoplasm of breast: Secondary | ICD-10-CM

## 2015-08-06 DIAGNOSIS — Z86010 Personal history of colonic polyps: Secondary | ICD-10-CM

## 2015-08-06 NOTE — Progress Notes (Signed)
Patient ID: Martha Henry, female   DOB: 1965/10/10, 50 y.o.   MRN: [id number]  Chief Complaint  Patient presents with  . Follow-up    mammogram    HPI Martha Henry is a 50 y.o. female.  who presents for a breast evaluation. The most recent mammogram was done on 07-25-15.  Patient does perform regular self breast checks and gets regular mammograms done.  Colonoscopy done last yr-one tubular adenoma removed No new breast issues. I have reviewed the history of present illness with the patient.  HPI  Past Medical History  Diagnosis Date  . ITP (idiopathic thrombocytopenic purpura) 1990    no issues in several years  . History of abnormal mammogram     seen by Dr. Jamal Collin  . Hypothyroidism   . GERD (gastroesophageal reflux disease)   . Headache     sinus  . Wears contact lenses   . Tubular adenoma of colon 2016    Past Surgical History  Procedure Laterality Date  . Cholecystectomy  2009  . Appendectomy  2009  . Tonsillectomy  1975  . Abdominal hysterectomy  2003    partial/only ovaries remain  . Colonoscopy with propofol N/A 02/26/2015    Procedure: COLONOSCOPY WITH PROPOFOL;  Surgeon: Christene Lye, MD;  Location: [facility];  Service: Endoscopy;  Laterality: N/A;  . Septoplasty N/A 05/02/2015    Procedure: SEPTOPLASTY;  Surgeon: Beverly Gust, MD;  Location: [place];  Service: ENT;  Laterality: N/A;  . Nasal turbinate reduction Bilateral 05/02/2015    Procedure: TURBINATE REDUCTION/SUBMUCOSAL RESECTION;  Surgeon: Beverly Gust, MD;  Location: [place];  Service: ENT;  Laterality: Bilateral;    Family History  Problem Relation Age of Onset  . Cancer Mother     breast  . Breast cancer Maternal Aunt   . Cancer Maternal Aunt     breast  . Colon cancer Father 63  . Cancer Father     colon  . Lung cancer Maternal Grandfather   . Cervical cancer Maternal Grandmother     Social History Social History  Substance Use Topics  .  Smoking status: Never Smoker   . Smokeless tobacco: Never Used  . Alcohol Use: No    Allergies  Allergen Reactions  . Erythromycin Other (See Comments)    Chest pain  . Aspirin Other (See Comments)    Platelet problems  . Ciprofloxacin Other (See Comments)    constipation    Current Outpatient Prescriptions  Medication Sig Dispense Refill  . Calcium-Vitamin D-Vitamin K (VIACTIV) [id number] MG-UNT-MCG CHEW Chew by mouth.    . cetirizine (ZYRTEC) 10 MG tablet Take 10 mg by mouth daily.    . Cyanocobalamin (VITAMIN B-12 PO) Take by mouth daily.    Marland Kitchen imipramine (TOFRANIL) 25 MG tablet Take 25 mg by mouth daily.    . Lactobacillus (DIGESTIVE HEALTH PROBIOTIC PO) Take by mouth daily.    . lansoprazole (PREVACID) 15 MG capsule Take 15 mg by mouth daily.    Marland Kitchen levothyroxine (SYNTHROID, LEVOTHROID) 25 MCG tablet Take 25 mcg by mouth daily.    . Multiple Vitamins-Minerals (MULTIVITAMIN WITH MINERALS) tablet Take 1 tablet by mouth daily.    Marland Kitchen OVER THE COUNTER MEDICATION 500 mg. Tumeric     No current facility-administered medications for this visit.    Review of Systems Review of Systems  Constitutional: Negative.   Respiratory: Negative.   Cardiovascular: Negative.     Blood pressure 122/82, pulse 70, resp. rate 14,  height 5\' 2"  (1.575 m), weight 153 lb (69.4 kg).  Physical Exam Physical Exam  Constitutional: She is oriented to person, place, and time. She appears well-developed and well-nourished.  HENT:  Mouth/Throat: Oropharynx is clear and moist.  Eyes: Conjunctivae are normal. No scleral icterus.  Neck: Neck supple.  Cardiovascular: Normal rate, regular rhythm and normal heart sounds.   Pulmonary/Chest: Effort normal and breath sounds normal. Right breast exhibits no inverted nipple, no mass, no nipple discharge, no skin change and no tenderness. Left breast exhibits no inverted nipple, no mass, no nipple discharge, no skin change and no tenderness.  Lymphadenopathy:    She  has no cervical adenopathy.    She has no axillary adenopathy.  Neurological: She is alert and oriented to person, place, and time.  Skin: Skin is warm and dry.  Psychiatric: Her behavior is normal.    Data Reviewed Mammogram reviewed and stable.  Assessment    Stable physical exam. Fibrocystic breast disease. Family history of breast and colon cancer.        Plan    Patient will be asked to return to the office in one year with a bilateral screening mammogram. Continue self breast exams. Call office for any new breast issues or concerns.        PCP:  Arnetha Courser This information has been scribed by Karie Fetch RN, BSN,BC.    Yen Wandell G 08/06/2015, 10:39 AM

## 2015-08-06 NOTE — Patient Instructions (Addendum)
The patient is aware to call back for any questions or concerns. Patient will be asked to return to the office in one year with a bilateral screening mammogram. 

## 2015-11-11 DIAGNOSIS — R3129 Other microscopic hematuria: Secondary | ICD-10-CM | POA: Insufficient documentation

## 2016-06-11 ENCOUNTER — Encounter: Payer: Self-pay | Admitting: Family Medicine

## 2016-06-11 ENCOUNTER — Ambulatory Visit (INDEPENDENT_AMBULATORY_CARE_PROVIDER_SITE_OTHER): Payer: Commercial Managed Care - HMO | Admitting: Family Medicine

## 2016-06-11 VITALS — BP 117/81 | HR 105 | Temp 98.3°F | Wt 156.0 lb

## 2016-06-11 DIAGNOSIS — R070 Pain in throat: Secondary | ICD-10-CM

## 2016-06-11 DIAGNOSIS — R0789 Other chest pain: Secondary | ICD-10-CM

## 2016-06-11 NOTE — Progress Notes (Signed)
BP 117/81   Pulse (!) 105   Temp 98.3 F (36.8 C)   Wt 156 lb (70.8 kg)   SpO2 100%   BMI 28.53 kg/m    Subjective:    Patient ID: Martha Henry, female    DOB: [birth year], 51 y.o.   MRN: [id number]  HPI: Martha Henry is a 51 y.o. female  Chief Complaint  Patient presents with  . Heartburn    worse lately, woke up hurting, feeling like she had indigestion. Tried rolaids, gas x.    Patient presents with burning pain in neck area that has lared up since Wednesday. Waking her up in the night. Belching, no appetite. Feels like she has a big knot in her throat. Denies dysphagia, fever, chills, sore throat, abdominal pain, vomiting, diarrhea. Taking prevacid daily, no relief with that.  Pain also down jaw and down right arm. States arm is numb, tingly. Some chest tightness. Denies any CP, SOB, diaphoresis.  Hx of appendectomy, hysterectomy, cholecystectomy. No cardiac hx.   Past Medical History:  Diagnosis Date  . GERD (gastroesophageal reflux disease)   . Headache    sinus  . History of abnormal mammogram    seen by Dr. Jamal Collin  . Hypothyroidism   . ITP (idiopathic thrombocytopenic purpura) 1990   no issues in several years  . Tubular adenoma of colon 2016  . Wears contact lenses    Social History   Social History  . Marital status: Married    Spouse name: N/A  . Number of children: N/A  . Years of education: N/A   Occupational History  . Not on file.   Social History Main Topics  . Smoking status: Never Smoker  . Smokeless tobacco: Never Used  . Alcohol use No  . Drug use: No  . Sexual activity: Not on file   Other Topics Concern  . Not on file   Social History Narrative  . No narrative on file    Relevant past medical, surgical, family and social history reviewed and updated as indicated. Interim medical history since our last visit reviewed. Allergies and medications reviewed and updated.  Review of Systems  Constitutional: Negative.   HENT:      Jaw pain Burning pain in throat   Eyes: Negative.   Respiratory: Positive for chest tightness.   Cardiovascular: Negative.   Genitourinary: Negative.   Musculoskeletal: Positive for arthralgias (right arm).  Skin: Negative.   Neurological: Positive for numbness (down right arm).  Psychiatric/Behavioral: Negative.     Per HPI unless specifically indicated above     Objective:    BP 117/81   Pulse (!) 105   Temp 98.3 F (36.8 C)   Wt 156 lb (70.8 kg)   SpO2 100%   BMI 28.53 kg/m   Wt Readings from Last 3 Encounters:  06/11/16 156 lb (70.8 kg)  08/06/15 153 lb (69.4 kg)  05/02/15 153 lb (69.4 kg)    Physical Exam  Constitutional: She is oriented to person, place, and time. She appears well-developed and well-nourished. No distress.  HENT:  Head: Atraumatic.  Mouth/Throat: Oropharynx is clear and moist.  Eyes: Conjunctivae are normal. Pupils are equal, round, and reactive to light. No scleral icterus.  Neck: Normal range of motion. Neck supple. No thyromegaly present.  Cardiovascular: Regular rhythm and normal heart sounds.   tachycardic  Pulmonary/Chest: Effort normal and breath sounds normal. No respiratory distress.  Musculoskeletal: Normal range of motion.  Lymphadenopathy:    She  has no cervical adenopathy.  Neurological: She is alert and oriented to person, place, and time. No cranial nerve deficit.  Skin: Skin is warm and dry.  Psychiatric: She has a normal mood and affect. Her behavior is normal.  Nursing note and vitals reviewed.     Assessment & Plan:   Problem List Items Addressed This Visit    None    Visit Diagnoses    Throat pain    -  Primary   Continue prevacid. Can also add pepcid. Await lab results. Discussed bland diet, avoiding NSAIDs and other irritants. Follow up if no improvement.    Chest tightness       EKG normal today, but given sxs discussed with pt some options and she would like to see Cardiology for further eval. Return  precautions given.    Relevant Orders   EKG 12-Lead (Completed)   CBC with Differential/Platelet (Completed)   Comprehensive metabolic panel (Completed)   Helicobacter pylori abs-IgG+IgA, bld (Completed)   Ambulatory referral to Cardiology       Follow up plan: Return if symptoms worsen or fail to improve.

## 2016-06-15 LAB — COMPREHENSIVE METABOLIC PANEL
A/G RATIO: 1.7 (ref 1.2–2.2)
ALBUMIN: 4 g/dL (ref 3.5–5.5)
ALT: 25 IU/L (ref 0–32)
AST: 27 IU/L (ref 0–40)
Alkaline Phosphatase: 91 IU/L (ref 39–117)
BILIRUBIN TOTAL: 0.3 mg/dL (ref 0.0–1.2)
BUN / CREAT RATIO: 9 (ref 9–23)
BUN: 6 mg/dL (ref 6–24)
CALCIUM: 9.7 mg/dL (ref 8.7–10.2)
CHLORIDE: 99 mmol/L (ref 96–106)
CO2: 24 mmol/L (ref 18–29)
Creatinine, Ser: 0.68 mg/dL (ref 0.57–1.00)
GFR, EST AFRICAN AMERICAN: 120 mL/min/{1.73_m2} (ref >59)
GFR, EST NON AFRICAN AMERICAN: 104 mL/min/{1.73_m2} (ref >59)
Globulin, Total: 2.3 g/dL (ref 1.5–4.5)
Glucose: 87 mg/dL (ref 65–99)
POTASSIUM: 4.2 mmol/L (ref 3.5–5.2)
Sodium: 139 mmol/L (ref 134–144)
TOTAL PROTEIN: 6.3 g/dL (ref 6.0–8.5)

## 2016-06-15 LAB — CBC WITH DIFFERENTIAL/PLATELET
BASOS: 0 %
Basophils Absolute: 0 10*3/uL (ref 0.0–0.2)
EOS (ABSOLUTE): 0.2 10*3/uL (ref 0.0–0.4)
EOS: 2 %
Hematocrit: 41.1 % (ref 34.0–46.6)
Hemoglobin: 13.6 g/dL (ref 11.1–15.9)
IMMATURE GRANS (ABS): 0 10*3/uL (ref 0.0–0.1)
IMMATURE GRANULOCYTES: 1 %
Lymphocytes Absolute: 3.1 10*3/uL (ref 0.7–3.1)
Lymphs: 35 %
MCH: 26.7 pg (ref 26.6–33.0)
MCHC: 33.1 g/dL (ref 31.5–35.7)
MCV: 81 fL (ref 79–97)
Monocytes Absolute: 0.8 10*3/uL (ref 0.1–0.9)
Monocytes: 9 %
NEUTROS ABS: 4.8 10*3/uL (ref 1.4–7.0)
NEUTROS PCT: 53 %
PLATELETS: 354 10*3/uL (ref 150–379)
RBC: 5.09 x10E6/uL (ref 3.77–5.28)
RDW: 13.8 % (ref 12.3–15.4)
WBC: 8.9 10*3/uL (ref 3.4–10.8)

## 2016-06-15 LAB — HELICOBACTER PYLORI ABS-IGG+IGA, BLD
H. pylori, IgA Abs: <9 units (ref 0.0–8.9)
H. pylori, IgG AbS: <0.8 Index Value (ref 0.00–0.79)

## 2016-06-15 NOTE — Patient Instructions (Signed)
Follow up as needed

## 2016-06-18 ENCOUNTER — Encounter: Payer: Self-pay | Admitting: Family Medicine

## 2016-06-21 NOTE — Progress Notes (Signed)
PCP is changed.  Thanks

## 2016-07-01 ENCOUNTER — Encounter: Payer: Self-pay | Admitting: *Deleted

## 2016-08-06 ENCOUNTER — Telehealth: Payer: Self-pay | Admitting: General Surgery

## 2016-08-06 NOTE — Telephone Encounter (Signed)
L/M ON CELL FOR PT TO CALL BACK & RESCHEDULE APPOINTMENT 08-12-16 TO NEXT AVAILABLE.

## 2016-08-10 ENCOUNTER — Encounter: Payer: Self-pay | Admitting: General Surgery

## 2016-08-12 ENCOUNTER — Ambulatory Visit: Payer: Commercial Managed Care - HMO | Admitting: General Surgery

## 2016-08-16 ENCOUNTER — Encounter: Payer: Self-pay | Admitting: General Surgery

## 2016-08-16 ENCOUNTER — Ambulatory Visit (INDEPENDENT_AMBULATORY_CARE_PROVIDER_SITE_OTHER): Payer: Commercial Managed Care - HMO | Admitting: General Surgery

## 2016-08-16 VITALS — BP 128/74 | HR 66 | Resp 14 | Ht 62.0 in | Wt 157.0 lb

## 2016-08-16 DIAGNOSIS — Z800 Family history of malignant neoplasm of digestive organs: Secondary | ICD-10-CM | POA: Diagnosis not present

## 2016-08-16 DIAGNOSIS — N6019 Diffuse cystic mastopathy of unspecified breast: Secondary | ICD-10-CM

## 2016-08-16 DIAGNOSIS — Z803 Family history of malignant neoplasm of breast: Secondary | ICD-10-CM | POA: Diagnosis not present

## 2016-08-16 NOTE — Patient Instructions (Addendum)
Patient to return to her PCP for yearly mammogram and breast checks.  Patient is due for a colonoscopy in 2021 with Dr. Bary Castilla.

## 2016-08-16 NOTE — Progress Notes (Signed)
Patient ID: Martha Henry, female   DOB: 1965/11/27, 51 y.o.   MRN: [id number]  Chief Complaint  Patient presents with  . Follow-up    HPI Martha Henry is a 51 y.o. female who presents for a breast evaluation. The most recent mammogram was done on 08/02/2016 .  Patient does perform regular self breast checks and gets regular mammograms done.  No new breast issues.   HPI  Past Medical History:  Diagnosis Date  . GERD (gastroesophageal reflux disease)   . Headache    sinus  . History of abnormal mammogram    seen by Dr. Jamal Henry  . Hypothyroidism   . ITP (idiopathic thrombocytopenic purpura) 1990   no issues in several years  . Tubular adenoma of colon 2016  . Wears contact lenses     Past Surgical History:  Procedure Laterality Date  . ABDOMINAL HYSTERECTOMY  2003   partial/only ovaries remain  . APPENDECTOMY  2009  . CHOLECYSTECTOMY  2009  . COLONOSCOPY WITH PROPOFOL N/A 02/26/2015   Procedure: COLONOSCOPY WITH PROPOFOL;  Surgeon: Christene Lye, MD;  Location: [facility];  Service: Endoscopy;  Laterality: N/A;  . NASAL TURBINATE REDUCTION Bilateral 05/02/2015   Procedure: TURBINATE REDUCTION/SUBMUCOSAL RESECTION;  Surgeon: Beverly Gust, MD;  Location: [place];  Service: ENT;  Laterality: Bilateral;  . SEPTOPLASTY N/A 05/02/2015   Procedure: SEPTOPLASTY;  Surgeon: Beverly Gust, MD;  Location: [place];  Service: ENT;  Laterality: N/A;  . TONSILLECTOMY  1975    Family History  Problem Relation Age of Onset  . Cancer Mother     breast  . Colon cancer Father 55  . Cancer Father     colon  . Breast cancer Maternal Aunt   . Cancer Maternal Aunt     breast  . Lung cancer Maternal Grandfather   . Cervical cancer Maternal Grandmother     Social History Social History  Substance Use Topics  . Smoking status: Never Smoker  . Smokeless tobacco: Never Used  . Alcohol use No    Allergies  Allergen Reactions  . Erythromycin  Other (See Comments)    Chest pain  . Aspirin Other (See Comments)    Platelet problems  . Ciprofloxacin Other (See Comments)    constipation    Current Outpatient Prescriptions  Medication Sig Dispense Refill  . Calcium-Vitamin D-Vitamin K (VIACTIV) [id number] MG-UNT-MCG CHEW Chew by mouth.    . cetirizine (ZYRTEC) 10 MG tablet Take 10 mg by mouth daily.    . Cyanocobalamin (VITAMIN B-12 PO) Take by mouth daily.    Marland Kitchen imipramine (TOFRANIL) 25 MG tablet Take 25 mg by mouth daily.    . Lactobacillus (DIGESTIVE HEALTH PROBIOTIC PO) Take by mouth daily.    . lansoprazole (PREVACID) 15 MG capsule Take 15 mg by mouth daily.    Marland Kitchen levothyroxine (SYNTHROID, LEVOTHROID) 25 MCG tablet Take 25 mcg by mouth daily.    . Multiple Vitamins-Minerals (MULTIVITAMIN WITH MINERALS) tablet Take 1 tablet by mouth daily.    Marland Kitchen OVER THE COUNTER MEDICATION 500 mg. Tumeric     No current facility-administered medications for this visit.     Review of Systems Review of Systems  Constitutional: Negative.   Respiratory: Negative.   Cardiovascular: Negative.     Blood pressure 128/74, pulse 66, resp. rate 14, height 5\' 2"  (1.575 m), weight 157 lb (71.2 kg).  Physical Exam Physical Exam  Constitutional: She is oriented to person, place, and time. She appears well-developed  and well-nourished.  Eyes: Conjunctivae are normal. No scleral icterus.  Neck: Neck supple.  Cardiovascular: Normal rate, regular rhythm and normal heart sounds.   Pulmonary/Chest: Effort normal and breath sounds normal. Right breast exhibits no inverted nipple, no mass, no nipple discharge, no skin change and no tenderness. Left breast exhibits no inverted nipple, no mass, no nipple discharge, no skin change and no tenderness.  Abdominal: Soft. Bowel sounds are normal. There is no tenderness.  Lymphadenopathy:    She has no cervical adenopathy.    She has no axillary adenopathy.  Neurological: She is alert and oriented to person, place,  and time.  Skin: Skin is warm and dry.    Data Reviewed Mammogram reviewed and stable.   Assessment    Stable physical exam. Fibrocystic breast disease. Family history of breast and colon cancer.     Plan    Patient to return to her PCP for yearly mammogram and breast checks.  Patient is due for a colonoscopy in 2021 with Dr. Bary Castilla.  HPI, Physical Exam, Assessment and Plan have been scribed under the direction and in the presence of Martha Jewel, MD  Martha Henry, CMA       I have completed the exam and reviewed the above documentation for accuracy and completeness.  I agree with the above.  Martha Henry has been used and any errors in dictation or transcription are unintentional.  Martha Henry, M.D., F.A.C.S.  Martha Henry G 08/16/2016, 12:05 PM

## 2016-09-13 ENCOUNTER — Encounter: Payer: Self-pay | Admitting: Family Medicine

## 2016-09-13 ENCOUNTER — Other Ambulatory Visit: Payer: Self-pay

## 2016-09-13 ENCOUNTER — Ambulatory Visit (INDEPENDENT_AMBULATORY_CARE_PROVIDER_SITE_OTHER): Payer: Commercial Managed Care - HMO | Admitting: Family Medicine

## 2016-09-13 VITALS — BP 117/79 | HR 112 | Temp 98.2°F | Wt 156.0 lb

## 2016-09-13 DIAGNOSIS — R21 Rash and other nonspecific skin eruption: Secondary | ICD-10-CM | POA: Diagnosis not present

## 2016-09-13 MED ORDER — PREDNISONE 10 MG PO TABS: ORAL_TABLET | ORAL | 0 refills | Status: DC

## 2016-09-13 MED ORDER — VALACYCLOVIR HCL 1 G PO TABS: 1000.0000 mg | ORAL_TABLET | Freq: Two times a day (BID) | ORAL | 0 refills | Status: DC

## 2016-09-13 NOTE — Patient Instructions (Signed)
Follow up if worsening or no improvement

## 2016-09-13 NOTE — Progress Notes (Signed)
   BP 117/79   Pulse (!) 112   Temp 98.2 F (36.8 C)   Wt 156 lb (70.8 kg)   SpO2 100%   BMI 28.53 kg/m    Subjective:    Patient ID: Martha Henry, female    DOB: [birth year], 51 y.o.   MRN: [id number]  HPI: Martha Henry is a 51 y.o. female  Chief Complaint  Patient presents with  . Rash    found the tick approx 10 days ago, was difficult to remove. Rash started this Saturday. Itchy. Not painful. No flu like symptoms. No fever.    Patient presents with 2 day hx of itchy, slightly tingly rash of right low back at the site of a tick bite 10 days ago. Area was just a red itchy bump for about a week, then came up with this bumpy rash. Has not been putting anything on it or monitoring it for changes closely. Denies fever, chills, fatigue, body aches or HAs outside of baseline. Keeps her granddaughter part time and was concerned about shingles.   Relevant past medical, surgical, family and social history reviewed and updated as indicated. Interim medical history since our last visit reviewed. Allergies and medications reviewed and updated.  Review of Systems  Constitutional: Negative.   HENT: Negative.   Respiratory: Negative.   Cardiovascular: Negative.   Gastrointestinal: Negative.   Musculoskeletal: Negative.   Skin: Positive for rash.  Neurological: Negative.   Psychiatric/Behavioral: Negative.    Per HPI unless specifically indicated above     Objective:    BP 117/79   Pulse (!) 112   Temp 98.2 F (36.8 C)   Wt 156 lb (70.8 kg)   SpO2 100%   BMI 28.53 kg/m   Wt Readings from Last 3 Encounters:  09/13/16 156 lb (70.8 kg)  08/16/16 157 lb (71.2 kg)  06/11/16 156 lb (70.8 kg)    Physical Exam  Constitutional: She is oriented to person, place, and time. She appears well-developed and well-nourished. No distress.  HENT:  Head: Atraumatic.  Eyes: Conjunctivae are normal. Pupils are equal, round, and reactive to light.  Neck: Normal range of motion. Neck  supple.  Cardiovascular: Normal rate and normal heart sounds.   Pulmonary/Chest: Effort normal and breath sounds normal.  Musculoskeletal: Normal range of motion. She exhibits no edema or tenderness.  Neurological: She is alert and oriented to person, place, and time.  Skin: Skin is warm and dry. Rash (Isolated erythematous, vesicular patch of right low back from midline and over laterally about 8 inches) noted.  Psychiatric: She has a normal mood and affect. Her behavior is normal.  Nursing note and vitals reviewed.     Assessment & Plan:   Problem List Items Addressed This Visit    None    Visit Diagnoses    Rash    -  Primary   Relevant Orders   Lyme Ab/Western Blot Reflex   Rocky mtn spotted fvr abs pnl(IgG+IgM)    Suspicious for shingles, will treat with prednisone and valtrex. Discussed isolation from granddaughter, other infants, and pregnant women in case. Will go ahead and draw tick labs to r/o though suspicion low at this time. Discussed sxs to watch for and call back including fevers, body aches, chills, HAs, fatigue.    Follow up plan: Return if symptoms worsen or fail to improve.

## 2016-09-15 ENCOUNTER — Encounter: Payer: Self-pay | Admitting: Family Medicine

## 2016-09-15 LAB — ROCKY MTN SPOTTED FVR ABS PNL(IGG+IGM)
RMSF IGG: NEGATIVE
RMSF IgM: 0.24 index (ref 0.00–0.89)

## 2016-09-15 LAB — LYME AB/WESTERN BLOT REFLEX
LYME DISEASE AB, QUANT, IGM: <0.8 index (ref 0.00–0.79)
Lyme IgG/IgM Ab: <0.91 {ISR} (ref 0.00–0.90)

## 2016-09-25 ENCOUNTER — Emergency Department: Payer: Commercial Managed Care - HMO

## 2016-09-25 ENCOUNTER — Encounter: Payer: Self-pay | Admitting: Emergency Medicine

## 2016-09-25 ENCOUNTER — Emergency Department
Admission: EM | Admit: 2016-09-25 | Discharge: 2016-09-25 | Disposition: A | Discharge status: Home / Self Care | Payer: Commercial Managed Care - HMO | Attending: Emergency Medicine | Admitting: Emergency Medicine

## 2016-09-25 DIAGNOSIS — R1030 Lower abdominal pain, unspecified: Secondary | ICD-10-CM | POA: Diagnosis present

## 2016-09-25 DIAGNOSIS — K529 Noninfective gastroenteritis and colitis, unspecified: Secondary | ICD-10-CM | POA: Insufficient documentation

## 2016-09-25 DIAGNOSIS — E039 Hypothyroidism, unspecified: Secondary | ICD-10-CM | POA: Diagnosis not present

## 2016-09-25 LAB — COMPREHENSIVE METABOLIC PANEL
ALBUMIN: 4.1 g/dL (ref 3.5–5.0)
ALT: 23 U/L (ref 14–54)
AST: 30 U/L (ref 15–41)
Alkaline Phosphatase: 83 U/L (ref 38–126)
Anion gap: 11 (ref 5–15)
BUN: 14 mg/dL (ref 6–20)
CHLORIDE: 104 mmol/L (ref 101–111)
CO2: 25 mmol/L (ref 22–32)
Calcium: 8.8 mg/dL — ABNORMAL LOW (ref 8.9–10.3)
Creatinine, Ser: 0.7 mg/dL (ref 0.44–1.00)
GFR calc Af Amer: >60 mL/min (ref >60)
Glucose, Bld: 121 mg/dL — ABNORMAL HIGH (ref 65–99)
POTASSIUM: 3.3 mmol/L — AB (ref 3.5–5.1)
Sodium: 140 mmol/L (ref 135–145)
Total Bilirubin: 1.2 mg/dL (ref 0.3–1.2)
Total Protein: 7.4 g/dL (ref 6.5–8.1)

## 2016-09-25 LAB — CBC WITH DIFFERENTIAL/PLATELET
BASOS ABS: 0 10*3/uL (ref 0–0.1)
Basophils Relative: 0 %
EOS ABS: 0 10*3/uL (ref 0–0.7)
EOS PCT: 0 %
HCT: 43.5 % (ref 35.0–47.0)
Hemoglobin: 14.4 g/dL (ref 12.0–16.0)
Lymphocytes Relative: 10 %
Lymphs Abs: 1.9 10*3/uL (ref 1.0–3.6)
MCH: 27.2 pg (ref 26.0–34.0)
MCHC: 33 g/dL (ref 32.0–36.0)
MCV: 82.4 fL (ref 80.0–100.0)
MONO ABS: 0.7 10*3/uL (ref 0.2–0.9)
Monocytes Relative: 4 %
Neutro Abs: 17.1 10*3/uL — ABNORMAL HIGH (ref 1.4–6.5)
Neutrophils Relative %: 86 %
Platelets: 309 10*3/uL (ref 150–440)
RBC: 5.28 MIL/uL — AB (ref 3.80–5.20)
RDW: 13.6 % (ref 11.5–14.5)
WBC: 19.8 10*3/uL — AB (ref 3.6–11.0)

## 2016-09-25 LAB — LIPASE, BLOOD: LIPASE: 15 U/L (ref 11–51)

## 2016-09-25 MED ORDER — ONDANSETRON 4 MG PO TBDP: 4.0000 mg | ORAL_TABLET | Freq: Three times a day (TID) | ORAL | 0 refills | Status: DC | PRN

## 2016-09-25 MED ORDER — SODIUM CHLORIDE 0.9 % IV BOLUS (SEPSIS)
1000.0000 mL | Freq: Once | INTRAVENOUS | Status: AC
  Administered 2016-09-25: 1000 mL via INTRAVENOUS

## 2016-09-25 MED ORDER — ONDANSETRON HCL 4 MG/2ML IJ SOLN
4.0000 mg | Freq: Once | INTRAMUSCULAR | Status: AC
  Administered 2016-09-25: 4 mg via INTRAVENOUS
  Filled 2016-09-25: qty 2

## 2016-09-25 MED ORDER — IOPAMIDOL (ISOVUE-300) INJECTION 61%
100.0000 mL | Freq: Once | INTRAVENOUS | Status: AC | PRN
  Administered 2016-09-25: 100 mL via INTRAVENOUS

## 2016-09-25 MED ORDER — METOCLOPRAMIDE HCL 5 MG/ML IJ SOLN
10.0000 mg | Freq: Once | INTRAMUSCULAR | Status: AC
  Administered 2016-09-25: 10 mg via INTRAVENOUS

## 2016-09-25 MED ORDER — METOCLOPRAMIDE HCL 5 MG/ML IJ SOLN
INTRAMUSCULAR | Status: AC
  Filled 2016-09-25: qty 2

## 2016-09-25 NOTE — ED Notes (Signed)
Pt returned from ct scan

## 2016-09-25 NOTE — ED Provider Notes (Signed)
[facility] Emergency Department Provider Note  ____________________________________________  Time seen: Approximately 3:27 AM  I have reviewed the triage vital signs and the nursing notes.   HISTORY  Chief Complaint Abdominal Pain and Emesis   HPI Martha Henry is a 51 y.o. female with a history of GERD and ITP who presents for evaluation of vomiting. Patient reports nausea and several episodes of nonbloody nonbilious emesis since 10PM this evening. No hematemesis,coffee-ground emesis, melena, diarrhea. She has had chills but no fever. Her grandbaby was sick last week with similar symptoms. Patient has had multiple abdominal surgeries including hysterectomy, cholecystectomy, and appendectomy. She endorses constipation for the last 3 days. She reports that she recently switched her probiotics and does not think the new one is working. She denies any prior history of SBO. She denies abdominal distention. She is passing flatus. She is also complaining of lower crampy intermittent mild abdominal pain that started with the episodes of vomiting. No chest pain or shortness of breath, no cough or congestion, no dysuria or hematuria.  Past Medical History:  Diagnosis Date  . GERD (gastroesophageal reflux disease)   . Headache    sinus  . History of abnormal mammogram    seen by Dr. Jamal Collin  . Hypothyroidism   . ITP (idiopathic thrombocytopenic purpura) 1990   no issues in several years  . Tubular adenoma of colon 2016  . Wears contact lenses     Patient Active Problem List   Diagnosis Date Noted  . Family history of colon cancer 02/03/2015  . Hematuria 01/08/2015  . Bacteriuria with pyuria 01/08/2015  . Family hx of colon cancer 11/20/2014  . Urinary frequency 11/20/2014  . History of ITP 11/20/2014  . Hypothyroidism 11/20/2014  . Rhinitis, allergic 11/20/2014  . Family history of breast cancer 08/01/2012    Past Surgical History:  Procedure  Laterality Date  . ABDOMINAL HYSTERECTOMY  2003   partial/only ovaries remain  . APPENDECTOMY  2009  . CHOLECYSTECTOMY  2009  . COLONOSCOPY WITH PROPOFOL N/A 02/26/2015   Procedure: COLONOSCOPY WITH PROPOFOL;  Surgeon: Christene Lye, MD;  Location: [facility] ENDOSCOPY;  Service: Endoscopy;  Laterality: N/A;  . NASAL TURBINATE REDUCTION Bilateral 05/02/2015   Procedure: TURBINATE REDUCTION/SUBMUCOSAL RESECTION;  Surgeon: Beverly Gust, MD;  Location: [place];  Service: ENT;  Laterality: Bilateral;  . SEPTOPLASTY N/A 05/02/2015   Procedure: SEPTOPLASTY;  Surgeon: Beverly Gust, MD;  Location: [place];  Service: ENT;  Laterality: N/A;  . [place]    Prior to Admission medications   Medication Sig Start Date End Date Taking? Authorizing Provider  Calcium-Vitamin D-Vitamin K (VIACTIV) [phone] MG-UNT-MCG CHEW Chew by mouth.    [provider]  cetirizine (ZYRTEC) 10 MG tablet Take 10 mg by mouth daily.    [provider]  Cholecalciferol (VITAMIN D-1000 MAX ST) 1000 units tablet Take by mouth.    [provider]  Cyanocobalamin (VITAMIN B-12 PO) Take by mouth daily.    [provider]  imipramine (TOFRANIL) 25 MG tablet Take 25 mg by mouth daily. 06/02/12   [provider]  Lactobacillus (DIGESTIVE HEALTH PROBIOTIC PO) Take by mouth daily.    [provider]  lansoprazole (PREVACID) 15 MG capsule Take 15 mg by mouth daily.    [provider]  Levothyroxine Sodium 50 MCG CAPS Take 50 mcg by mouth daily. 07/08/16   [provider]  Multiple Vitamins-Minerals (MULTIVITAMIN WITH MINERALS) tablet Take 1 tablet by  mouth daily.    [provider]  ondansetron (ZOFRAN ODT) 4 MG disintegrating tablet Take 1 tablet (4 mg total) by mouth every 8 (eight) hours as needed for nausea or vomiting. 09/25/16   Rudene Re, MD  OVER THE COUNTER MEDICATION 500 mg. Tumeric    [provider]  predniSONE (DELTASONE) 10 MG tablet Take 6 tabs daily x 2 days then 5 tabs daily x 2 days etc 09/13/16   Volney American, PA-C  valACYclovir (VALTREX) 1000 MG tablet Take 1 tablet (1,000 mg total) by mouth 2 (two) times daily. 09/13/16   Volney American, PA-C    Allergies Erythromycin; Aspirin; and Ciprofloxacin  Family History  Problem Relation Age of Onset  . Cancer Mother        breast  . Colon cancer Father 81  . Cancer Father        colon  . Breast cancer Maternal Aunt   . Cancer Maternal Aunt        breast  . Lung cancer Maternal Grandfather   . Cervical cancer Maternal Grandmother     Social History Social History  Substance Use Topics  . Smoking status: Never Smoker  . Smokeless tobacco: Never Used  . Alcohol use No    Review of Systems  Constitutional: Negative for fever. + chills Eyes: Negative for visual changes. ENT: Negative for sore throat. Neck: No neck pain  Cardiovascular: Negative for chest pain. Respiratory: Negative for shortness of breath. Gastrointestinal: + lower abdominal cramping, N/V, constipation. No diarrhea. Genitourinary: Negative for dysuria. Musculoskeletal: Negative for back pain. Skin: Negative for rash. Neurological: Negative for headaches, weakness or numbness. Psych: No SI or HI  ____________________________________________   PHYSICAL EXAM:  VITAL SIGNS: ED Triage Vitals  Enc Vitals Group     BP 09/25/16 0325 132/88     Pulse Rate 09/25/16 0324 (!) 102     Resp 09/25/16 0323 18     Temp 09/25/16 0323 97.7 F (36.5 C)     Temp Source 09/25/16 0323 Oral     SpO2 09/25/16 0324 100 %     Weight 09/25/16 0325 155 lb (70.3 kg)     Height 09/25/16 0325 5\' 2"  (1.575 m)     Head Circumference --      Peak Flow --      Pain Score 09/25/16 0321 5     Pain Loc --      Pain Edu? --      Excl. in [place]? --     Constitutional: Alert and oriented, shivering, no distress.  HEENT:      Head:  Normocephalic and atraumatic.         Eyes: Conjunctivae are normal. Sclera is non-icteric.       Mouth/Throat: Mucous membranes are moist.       Neck: Supple with no signs of meningismus. Cardiovascular: Tachycardic with regular rhythm. No murmurs, gallops, or rubs. 2+ symmetrical distal pulses are present in all extremities. No JVD. Respiratory: Normal respiratory effort. Lungs are clear to auscultation bilaterally. No wheezes, crackles, or rhonchi.  Gastrointestinal: Soft, non tender, and non distended with positive bowel sounds. No rebound or guarding. Genitourinary: No CVA tenderness. Musculoskeletal: Nontender with normal range of motion in all extremities. No edema, cyanosis, or erythema of extremities. Neurologic: Normal speech and language. Face is symmetric. Moving all extremities. No gross focal neurologic deficits are appreciated. Skin: Skin is warm, dry and intact. No rash noted. Psychiatric: Mood and affect are  normal. Speech and behavior are normal.  ____________________________________________   LABS (all labs ordered are listed, but only abnormal results are displayed)  Labs Reviewed  CBC WITH DIFFERENTIAL/PLATELET - Abnormal; Notable for the following:       Result Value   WBC 19.8 (*)    RBC 5.28 (*)    Neutro Abs 17.1 (*)    All other components within normal limits  COMPREHENSIVE METABOLIC PANEL - Abnormal; Notable for the following:    Potassium 3.3 (*)    Glucose, Bld 121 (*)    Calcium 8.8 (*)    All other components within normal limits  LIPASE, BLOOD   ____________________________________________  EKG  none ____________________________________________  RADIOLOGY  KUB: Nonobstructive bowel gas pattern.  CT a/p: Nondistended small and large bowel with air-fluid levels seen with enteritis, no complication. ____________________________________________   PROCEDURES  Procedure(s) performed: None Procedures Critical Care performed:   None ____________________________________________   INITIAL IMPRESSION / ASSESSMENT AND PLAN / ED COURSE  51 y.o. female with a history of GERD and ITP who presents for evaluation of several episodes of nonbloody nonbilious emesis, chills, and abdominal cramping that started at 10 PM. Patient exposed to a grandchild who had similar symptoms last week. Patient is mildly tachycardic with heart rate of 102, she is afebrile but shivering, abdomen is soft and nontender. Ddx gastritis, GERD, SBO. Plan for labs, IVF, IV zofran, KUB.    ----------------------------------------- 4:18 AM on 09/25/2016 -----------------------------------------   OBSERVATION CARE: This patient is being placed under observation care for the following reasons: Dehydrated patient observed to administer fluids and ability to retain oral liquids   _________________________ 5:10 AM on 09/25/2016 ----------------------------------------- CT showing enteritis, with no other acute findings. Labs showing leukocytosis otherwise WNL. Patient continues to complain of nausea after drinking ginger ale. Will give 2nd bolus of IVF and reglan.   ----------------------------------------- 6:24 AM on 09/25/2016 -----------------------------------------   END OF OBSERVATION STATUS: After an appropriate period of observation, this patient is being discharged due to the following reason(s):  Patient requesting to be discharged. Tells me she feels better, tolerating PO. Tells me she is tired and want to go home and sleep. She continues to be slightly tachycardic after 2 L NS. Offered continue IV hydration however patient prefers PO hydration at home. Will dc on zofran, slow diet advancement and return to the ER for signs of dehydration.    Pertinent labs & imaging results that were available during my care of the patient were reviewed by me and considered in my medical decision making (see chart for  details).    ____________________________________________   FINAL CLINICAL IMPRESSION(S) / ED DIAGNOSES  Final diagnoses:  Enteritis      NEW MEDICATIONS STARTED DURING THIS VISIT:  New Prescriptions   ONDANSETRON (ZOFRAN ODT) 4 MG DISINTEGRATING TABLET    Take 1 tablet (4 mg total) by mouth every 8 (eight) hours as needed for nausea or vomiting.     Note:  This document was prepared using Dragon voice recognition software and may include unintentional dictation errors.    Rudene Re, MD 09/25/16 [phone]

## 2016-09-25 NOTE — ED Notes (Signed)
Pt assisted to restroom by this EDT

## 2016-09-25 NOTE — ED Notes (Signed)
Pt states nausea improved. Pt provided with ice chips per md request.

## 2016-09-25 NOTE — ED Notes (Signed)
Pt assisted up to commode to void. Pt states she is feeling nauseated again. Order for reglan and additional ns bolus received. Pt states "so what are we waiting on now?" pt again updated on plan of care. Pt informed "we are waiting on your heart rate to decrease and for you to have less nausea." pt verbalizes understanding.

## 2016-09-25 NOTE — ED Triage Notes (Signed)
Pt with nausea and vomiting since 2200 with lower abd pain. Pt with diaphoresis noted. Pt denies known fever. Pt denies diarrhea.

## 2016-09-25 NOTE — ED Notes (Signed)
md provided pt with po fluids. Pt reports improved nausea. Pt with improved skin color.

## 2016-11-29 ENCOUNTER — Ambulatory Visit (INDEPENDENT_AMBULATORY_CARE_PROVIDER_SITE_OTHER): Payer: 59 | Admitting: Family Medicine

## 2016-11-29 ENCOUNTER — Encounter: Payer: Self-pay | Admitting: Family Medicine

## 2016-11-29 VITALS — BP 117/81 | HR 111 | Temp 98.3°F | Wt 153.0 lb

## 2016-11-29 DIAGNOSIS — J069 Acute upper respiratory infection, unspecified: Secondary | ICD-10-CM | POA: Diagnosis not present

## 2016-11-29 MED ORDER — FLUTICASONE PROPIONATE 50 MCG/ACT NA SUSP: 2.0000 | Freq: Every day | NASAL | 6 refills | Status: DC

## 2016-11-29 NOTE — Progress Notes (Signed)
   BP 117/81   Pulse (!) 111   Temp 98.3 F (36.8 C)   Wt 153 lb (69.4 kg)   SpO2 100%   BMI 27.98 kg/m    Subjective:    Patient ID: Martha Henry, female    DOB: 1965-07-28, 51 y.o.   MRN: [id number]  HPI: Martha Henry is a 51 y.o. female  Chief Complaint  Patient presents with  . Sore Throat    x 2 days, sore throat and head congestion. No fever. no cough, no runny nose, no sinus drainage.   Patient presents with 2 day hx of sore throat and congestion/facial pressure. Denies fever, chills, aches, CP, SOB, ear pain. Has not tried anything OTC for sxs. Granddaughter was sick last week with a cold, and several people at church lately have been sick with colds.    Relevant past medical, surgical, family and social history reviewed and updated as indicated. Interim medical history since our last visit reviewed. Allergies and medications reviewed and updated.  Review of Systems  Constitutional: Negative.   HENT: Positive for congestion and sore throat.   Respiratory: Negative.   Cardiovascular: Negative.   Gastrointestinal: Negative.   Musculoskeletal: Negative.   Neurological: Negative.   Psychiatric/Behavioral: Negative.    Per HPI unless specifically indicated above     Objective:    BP 117/81   Pulse (!) 111   Temp 98.3 F (36.8 C)   Wt 153 lb (69.4 kg)   SpO2 100%   BMI 27.98 kg/m   Wt Readings from Last 3 Encounters:  11/29/16 153 lb (69.4 kg)  09/25/16 155 lb (70.3 kg)  09/13/16 156 lb (70.8 kg)    Physical Exam  Constitutional: She is oriented to person, place, and time. She appears well-developed and well-nourished. No distress.  HENT:  Head: Atraumatic.  Right Ear: External ear normal.  Left Ear: External ear normal.  Mouth/Throat: No oropharyngeal exudate.  Oropharynx mildly erythematous and edematous Nasal mucosa injected  Eyes: Pupils are equal, round, and reactive to light. Conjunctivae are normal.  Neck: Normal range of motion. Neck  supple.  Cardiovascular: Normal rate, regular rhythm and normal heart sounds.   Pulmonary/Chest: Effort normal and breath sounds normal. No respiratory distress.  Musculoskeletal: Normal range of motion. She exhibits no edema or tenderness.  Neurological: She is alert and oriented to person, place, and time.  Skin: Skin is warm and dry.  Psychiatric: She has a normal mood and affect. Her behavior is normal.  Nursing note and vitals reviewed.     Assessment & Plan:   Problem List Items Addressed This Visit    None    Visit Diagnoses    Viral URI    -  Primary   Rapid strep negative, will treat with flonase, sudafed, and sinus rinses. Continue zyrtec per allergy regimen. Supportive care reviewed. F/u if worsening   Relevant Orders   Rapid strep screen (not at [facility])       Follow up plan: Return if symptoms worsen or fail to improve.

## 2016-11-29 NOTE — Patient Instructions (Signed)
Follow up if no improvement 

## 2016-12-02 LAB — CULTURE, GROUP A STREP: Strep A Culture: NEGATIVE

## 2016-12-02 LAB — RAPID STREP SCREEN (MED CTR MEBANE ONLY): Strep Gp A Ag, IA W/Reflex: NEGATIVE

## 2016-12-08 ENCOUNTER — Other Ambulatory Visit: Payer: Self-pay | Admitting: Family Medicine

## 2016-12-08 ENCOUNTER — Encounter: Payer: Self-pay | Admitting: Family Medicine

## 2016-12-08 MED ORDER — AMOXICILLIN-POT CLAVULANATE 875-125 MG PO TABS: 1.0000 | ORAL_TABLET | Freq: Two times a day (BID) | ORAL | 0 refills | Status: DC

## 2017-05-03 HISTORY — PX: BREAST CYST ASPIRATION: SHX578

## 2017-05-25 ENCOUNTER — Ambulatory Visit: Payer: BLUE CROSS/BLUE SHIELD | Admitting: Family Medicine

## 2017-05-25 ENCOUNTER — Encounter: Payer: Self-pay | Admitting: Family Medicine

## 2017-05-25 VITALS — BP 126/85 | HR 90 | Temp 97.7°F | Wt 157.1 lb

## 2017-05-25 DIAGNOSIS — M545 Low back pain: Secondary | ICD-10-CM

## 2017-05-25 DIAGNOSIS — N760 Acute vaginitis: Secondary | ICD-10-CM | POA: Diagnosis not present

## 2017-05-25 DIAGNOSIS — N898 Other specified noninflammatory disorders of vagina: Secondary | ICD-10-CM | POA: Diagnosis not present

## 2017-05-25 DIAGNOSIS — B9689 Other specified bacterial agents as the cause of diseases classified elsewhere: Secondary | ICD-10-CM | POA: Diagnosis not present

## 2017-05-25 LAB — WET PREP FOR TRICH, YEAST, CLUE
CLUE CELL EXAM: POSITIVE — AB
TRICHOMONAS EXAM: NEGATIVE
YEAST EXAM: NEGATIVE

## 2017-05-25 MED ORDER — METRONIDAZOLE 500 MG PO TABS: 500.0000 mg | ORAL_TABLET | Freq: Two times a day (BID) | ORAL | 0 refills | Status: DC

## 2017-05-25 NOTE — Patient Instructions (Addendum)
For vaginal irritation and dryness, can try hyaluronic acid instead of replense

## 2017-05-25 NOTE — Progress Notes (Signed)
BP 126/85   Pulse 90   Temp 97.7 F (36.5 C) (Oral)   Wt 157 lb 1.6 oz (71.3 kg)   SpO2 100%   BMI 28.73 kg/m    Subjective:    Patient ID: Martha Henry, female    DOB: 09-07-1965, 52 y.o.   MRN: [id number]  HPI: Martha Henry is a 52 y.o. female  Chief Complaint  Patient presents with  . Urinary Tract Infection    pt states she started having low back pain about a week ago, then lower abdominal discomfort started Monday    Low back pain, aching in low abdomen x 10 days. Has had UTIs in the past without classic urinary sxs, wondering if that's what going on. Also has a hx of sciatica but states this doesn't feel similar. Denies fevers, chills, N/V/D. Has not been taking anything OTC. No recent travel, no foods, no recent injury. When asked directly, pt does note that she recently started using Replense for vaginal dryness and irritation. No other new products.   Elevated HR several times now according to her fit watch. Denies any palpitations, SOB, CP, dizziness associated with these readings. No hx of cardiac issues known.   Past Medical History:  Diagnosis Date  . GERD (gastroesophageal reflux disease)   . Headache    sinus  . History of abnormal mammogram    seen by Dr. Jamal Collin  . Hypothyroidism   . ITP (idiopathic thrombocytopenic purpura) 1990   no issues in several years  . Tubular adenoma of colon 2016  . Wears contact lenses    Social History   Socioeconomic History  . Marital status: Married    Spouse name: Not on file  . Number of children: Not on file  . Years of education: Not on file  . Highest education level: Not on file  Social Needs  . Financial resource strain: Not on file  . Food insecurity - worry: Not on file  . Food insecurity - inability: Not on file  . Transportation needs - medical: Not on file  . Transportation needs - non-medical: Not on file  Occupational History  . Not on file  Tobacco Use  . Smoking status: Never Smoker  .  Smokeless tobacco: Never Used  Substance and Sexual Activity  . Alcohol use: No  . Drug use: No  . Sexual activity: Not on file  Other Topics Concern  . Not on file  Social History Narrative  . Not on file   Relevant past medical, surgical, family and social history reviewed and updated as indicated. Interim medical history since our last visit reviewed. Allergies and medications reviewed and updated.  Review of Systems  Respiratory: Negative.   Cardiovascular: Negative.   Gastrointestinal: Positive for abdominal pain.  Musculoskeletal: Positive for back pain.  Neurological: Negative.   Psychiatric/Behavioral: Negative.    Per HPI unless specifically indicated above     Objective:    BP 126/85   Pulse 90   Temp 97.7 F (36.5 C) (Oral)   Wt 157 lb 1.6 oz (71.3 kg)   SpO2 100%   BMI 28.73 kg/m   Wt Readings from Last 3 Encounters:  05/25/17 157 lb 1.6 oz (71.3 kg)  11/29/16 153 lb (69.4 kg)  09/25/16 155 lb (70.3 kg)    Physical Exam  Constitutional: She is oriented to person, place, and time. She appears well-developed and well-nourished. No distress.  HENT:  Head: Atraumatic.  Eyes: Conjunctivae are normal. Pupils  are equal, round, and reactive to light.  Neck: Normal range of motion. Neck supple.  Cardiovascular: Normal rate and normal heart sounds.  Abdominal: Soft. Bowel sounds are normal. She exhibits no mass. There is tenderness (mild generalized ttp). There is no rebound and no guarding.  Musculoskeletal: Normal range of motion.  Neurological: She is alert and oriented to person, place, and time.  Skin: Skin is warm and dry.  Psychiatric: She has a normal mood and affect. Her behavior is normal.  Nursing note and vitals reviewed.  Results for orders placed or performed in visit on 05/25/17  WET PREP FOR [place], YEAST, CLUE  Result Value Ref Range   Trichomonas Exam Negative Negative   Yeast Exam Negative Negative   Clue Cell Exam Positive (A) Negative    Microscopic Examination  Result Value Ref Range   WBC, UA 0-5 0 - 5 /hpf   RBC, UA 0-2 0 - 2 /hpf   Epithelial Cells (non renal) 0-10 0 - 10 /hpf   Bacteria, UA Moderate (A) None seen/Few  Urine Culture, Reflex  Result Value Ref Range   Urine Culture, Routine Final report    Organism ID, Bacteria Comment   UA/M w/rflx Culture, Routine  Result Value Ref Range   Specific Gravity, UA 1.020 1.005 - 1.030   pH, UA 6.0 5.0 - 7.5   Color, UA Yellow Yellow   Appearance Ur Turbid (A) Clear   Leukocytes, UA Negative Negative   Protein, UA Negative Negative/Trace   Glucose, UA Negative Negative   Ketones, UA Negative Negative   RBC, UA Trace (A) Negative   Bilirubin, UA Negative Negative   Urobilinogen, Ur 0.2 0.2 - 1.0 mg/dL   Nitrite, UA Negative Negative   Microscopic Examination See below:    Urinalysis Reflex Comment       Assessment & Plan:   Problem List Items Addressed This Visit    None    Visit Diagnoses    Low back pain, unspecified back pain laterality, unspecified chronicity, with sciatica presence unspecified    -  Primary   U/A normal, sxs not consistent with sciatica or GI issue. Suspect sxs from [place], but will continue to monitor closely for benefit   Relevant Orders   UA/M w/rflx Culture, Routine (Completed)   WET PREP FOR [place], YEAST, CLUE (Completed)   BV (bacterial vaginosis)       D/c replense, start flagyl and probiotics. Can increase yogurt intake, avoid scented soaps, wear cotton panties. F/u if no improvement   Relevant Medications   metroNIDAZOLE (FLAGYL) 500 MG tablet   Vaginal dryness       D/c replense, start hyaluronic acid. Use lubrication with intercourse. F/u if worsening or no improvement       Follow up plan: Return if symptoms worsen or fail to improve.

## 2017-05-26 ENCOUNTER — Encounter: Payer: Self-pay | Admitting: Family Medicine

## 2017-05-27 ENCOUNTER — Other Ambulatory Visit: Payer: Self-pay | Admitting: Family Medicine

## 2017-05-27 LAB — UA/M W/RFLX CULTURE, ROUTINE
BILIRUBIN UA: NEGATIVE
Glucose, UA: NEGATIVE
KETONES UA: NEGATIVE
Leukocytes, UA: NEGATIVE
NITRITE UA: NEGATIVE
Protein, UA: NEGATIVE
Specific Gravity, UA: 1.02 (ref 1.005–1.030)
UUROB: 0.2 mg/dL (ref 0.2–1.0)
pH, UA: 6 (ref 5.0–7.5)

## 2017-05-27 LAB — URINE CULTURE, REFLEX

## 2017-05-27 LAB — MICROSCOPIC EXAMINATION

## 2017-05-27 MED ORDER — CIPROFLOXACIN HCL 250 MG PO TABS: 250.0000 mg | ORAL_TABLET | Freq: Two times a day (BID) | ORAL | 0 refills | Status: DC

## 2017-06-23 DIAGNOSIS — N393 Stress incontinence (female) (male): Secondary | ICD-10-CM | POA: Diagnosis not present

## 2017-06-23 DIAGNOSIS — R390 Extravasation of urine: Secondary | ICD-10-CM | POA: Diagnosis not present

## 2017-06-23 DIAGNOSIS — R1030 Lower abdominal pain, unspecified: Secondary | ICD-10-CM | POA: Diagnosis not present

## 2017-06-23 DIAGNOSIS — R350 Frequency of micturition: Secondary | ICD-10-CM | POA: Diagnosis not present

## 2017-06-23 DIAGNOSIS — R3129 Other microscopic hematuria: Secondary | ICD-10-CM | POA: Diagnosis not present

## 2017-06-24 ENCOUNTER — Encounter: Payer: Self-pay | Admitting: Family Medicine

## 2017-06-30 DIAGNOSIS — E063 Autoimmune thyroiditis: Secondary | ICD-10-CM | POA: Diagnosis not present

## 2017-06-30 DIAGNOSIS — E038 Other specified hypothyroidism: Secondary | ICD-10-CM | POA: Diagnosis not present

## 2017-06-30 DIAGNOSIS — E538 Deficiency of other specified B group vitamins: Secondary | ICD-10-CM | POA: Diagnosis not present

## 2017-06-30 DIAGNOSIS — R5382 Chronic fatigue, unspecified: Secondary | ICD-10-CM | POA: Diagnosis not present

## 2017-07-08 DIAGNOSIS — E538 Deficiency of other specified B group vitamins: Secondary | ICD-10-CM | POA: Diagnosis not present

## 2017-07-08 DIAGNOSIS — E038 Other specified hypothyroidism: Secondary | ICD-10-CM | POA: Diagnosis not present

## 2017-07-08 DIAGNOSIS — R5382 Chronic fatigue, unspecified: Secondary | ICD-10-CM | POA: Diagnosis not present

## 2017-07-08 DIAGNOSIS — N951 Menopausal and female climacteric states: Secondary | ICD-10-CM | POA: Diagnosis not present

## 2017-07-18 ENCOUNTER — Encounter: Payer: Self-pay | Admitting: Family Medicine

## 2017-07-18 ENCOUNTER — Ambulatory Visit: Payer: BLUE CROSS/BLUE SHIELD | Admitting: Family Medicine

## 2017-07-18 VITALS — BP 114/77 | HR 111 | Temp 97.9°F | Wt 160.1 lb

## 2017-07-18 DIAGNOSIS — J309 Allergic rhinitis, unspecified: Secondary | ICD-10-CM | POA: Diagnosis not present

## 2017-07-18 DIAGNOSIS — J0100 Acute maxillary sinusitis, unspecified: Secondary | ICD-10-CM | POA: Diagnosis not present

## 2017-07-18 MED ORDER — FLUTICASONE PROPIONATE 50 MCG/ACT NA SUSP: 2.0000 | Freq: Two times a day (BID) | NASAL | 11 refills | Status: DC

## 2017-07-18 MED ORDER — AMOXICILLIN-POT CLAVULANATE 875-125 MG PO TABS: 1.0000 | ORAL_TABLET | Freq: Two times a day (BID) | ORAL | 0 refills | Status: DC

## 2017-07-18 MED ORDER — DM-GUAIFENESIN ER 30-600 MG PO TB12: 1.0000 | ORAL_TABLET | Freq: Two times a day (BID) | ORAL | 0 refills | Status: DC | PRN

## 2017-07-18 NOTE — Progress Notes (Signed)
BP 114/77 (BP Location: Right Arm, Patient Position: Sitting, Cuff Size: Normal)   Pulse (!) 111   Temp 97.9 F (36.6 C) (Oral)   Wt 160 lb 1.6 oz (72.6 kg)   SpO2 100%   BMI 29.28 kg/m    Subjective:    Patient ID: Martha Henry, female    DOB: Aug 13, 1965, 52 y.o.   MRN: [id number]  HPI: Martha Henry is a 51 y.o. female  Chief Complaint  Patient presents with  . Sinusitis    x's 1 week. Patient stated started off as allergies, became worse. Yellow sputum. Facial pain and teeth pain with congestion. Denies cough.  . Sore Throat  . Nasal Congestion  . Headache   2 weeks of rhinorrhea, sneezing, eye discharge, ear pressure and pain. Now the past week worsening malaise, thick congestion, facial pain and pressure, worsened ear pain. Denies cough, fever, sweats, body aches. Takes zyrtec and flonase for allergies, nothing OTC for cold or sinus. Feels in the beginning sxs were just allergies but now things are moving into something else. Several sick contacts.   Relevant past medical, surgical, family and social history reviewed and updated as indicated. Interim medical history since our last visit reviewed. Allergies and medications reviewed and updated.  Review of Systems  Per HPI unless specifically indicated above     Objective:    BP 114/77 (BP Location: Right Arm, Patient Position: Sitting, Cuff Size: Normal)   Pulse (!) 111   Temp 97.9 F (36.6 C) (Oral)   Wt 160 lb 1.6 oz (72.6 kg)   SpO2 100%   BMI 29.28 kg/m   Wt Readings from Last 3 Encounters:  07/18/17 160 lb 1.6 oz (72.6 kg)  05/25/17 157 lb 1.6 oz (71.3 kg)  11/29/16 153 lb (69.4 kg)    Physical Exam  Constitutional: She is oriented to person, place, and time. She appears well-developed and well-nourished. No distress.  HENT:  Head: Atraumatic.  Mouth/Throat: No oropharyngeal exudate.  B/l mild middle ear effusion Oropharynx and nasal mucosa erythematous with thick drainage present  Eyes:  Conjunctivae are normal. Pupils are equal, round, and reactive to light. No scleral icterus.  Neck: Normal range of motion. Neck supple.  Cardiovascular: Normal rate and normal heart sounds.  Pulmonary/Chest: Effort normal and breath sounds normal. No respiratory distress.  Musculoskeletal: Normal range of motion.  Neurological: She is alert and oriented to person, place, and time.  Skin: Skin is warm and dry.  Psychiatric: She has a normal mood and affect. Her behavior is normal.  Nursing note and vitals reviewed.   Results for orders placed or performed in visit on 05/25/17  WET PREP FOR [place], YEAST, CLUE  Result Value Ref Range   Trichomonas Exam Negative Negative   Yeast Exam Negative Negative   Clue Cell Exam Positive (A) Negative  Microscopic Examination  Result Value Ref Range   WBC, UA 0-5 0 - 5 /hpf   RBC, UA 0-2 0 - 2 /hpf   Epithelial Cells (non renal) 0-10 0 - 10 /hpf   Bacteria, UA Moderate (A) None seen/Few  Urine Culture, Reflex  Result Value Ref Range   Urine Culture, Routine Final report    Organism ID, Bacteria Comment   UA/M w/rflx Culture, Routine  Result Value Ref Range   Specific Gravity, UA 1.020 1.005 - 1.030   pH, UA 6.0 5.0 - 7.5   Color, UA Yellow Yellow   Appearance Ur Turbid (A) Clear  Leukocytes, UA Negative Negative   Protein, UA Negative Negative/Trace   Glucose, UA Negative Negative   Ketones, UA Negative Negative   RBC, UA Trace (A) Negative   Bilirubin, UA Negative Negative   Urobilinogen, Ur 0.2 0.2 - 1.0 mg/dL   Nitrite, UA Negative Negative   Microscopic Examination See below:    Urinalysis Reflex Comment       Assessment & Plan:   Problem List Items Addressed This Visit      Respiratory   Rhinitis, allergic    Continue zyrtec and flonase, discussed increasing to BID with both during severe allergy seasons such as this for better coverage.        Other Visit Diagnoses    Acute maxillary sinusitis, recurrence not specified     -  Primary   Will tx with augmentin, mucinex, sinus rinses, humidifier. Continue good allergy regimen. Return precautions reviewed   Relevant Medications   dextromethorphan-guaiFENesin (MUCINEX DM) 30-600 MG 12hr tablet   amoxicillin-clavulanate (AUGMENTIN) 875-125 MG tablet   fluticasone (FLONASE) 50 MCG/ACT nasal spray       Follow up plan: Return if symptoms worsen or fail to improve.

## 2017-07-19 NOTE — Assessment & Plan Note (Signed)
Continue zyrtec and flonase, discussed increasing to BID with both during severe allergy seasons such as this for better coverage.

## 2017-07-19 NOTE — Patient Instructions (Signed)
Follow up as needed

## 2017-07-26 ENCOUNTER — Encounter: Payer: Self-pay | Admitting: Family Medicine

## 2017-07-26 MED ORDER — FLUCONAZOLE 150 MG PO TABS: 150.0000 mg | ORAL_TABLET | Freq: Once | ORAL | 2 refills | Status: AC

## 2017-08-02 ENCOUNTER — Telehealth: Payer: Self-pay | Admitting: Family Medicine

## 2017-08-02 DIAGNOSIS — Z1239 Encounter for other screening for malignant neoplasm of breast: Secondary | ICD-10-CM

## 2017-08-02 NOTE — Telephone Encounter (Signed)
Copied from [place]. Topic: Quick Communication - See Telephone Encounter >> Aug 02, 2017 10:34 AM Cleaster Corin, NT wrote: CRM for notification. See Telephone encounter for: 08/02/17.  Pt. Needing to schedule mammogram (due for as of April 1st) pt. insurance will only cover the imaging centers in [place] pt. Can be reached at [phone]  [facility] [place] or diagnostic imaging in [place]

## 2017-08-02 NOTE — Telephone Encounter (Signed)
Martha Henry, could you put order in for mammogram so I can send it to [place]?

## 2017-08-03 NOTE — Telephone Encounter (Signed)
Order faxed to [facility].  Tried calling patient to tell her she can call and sched appointment. No answer. LVM for patient to return phone call.

## 2017-08-03 NOTE — Telephone Encounter (Signed)
Order placed

## 2017-08-04 NOTE — Telephone Encounter (Signed)
Patient has scheduled appointment for mammogram.

## 2017-09-02 ENCOUNTER — Ambulatory Visit
Admission: RE | Admit: 2017-09-02 | Discharge: 2017-09-02 | Disposition: A | Discharge status: Home / Self Care | Payer: BLUE CROSS/BLUE SHIELD | Source: Ambulatory Visit | Attending: Family Medicine | Admitting: Family Medicine

## 2017-09-02 DIAGNOSIS — Z1231 Encounter for screening mammogram for malignant neoplasm of breast: Secondary | ICD-10-CM | POA: Diagnosis not present

## 2017-09-02 DIAGNOSIS — Z1239 Encounter for other screening for malignant neoplasm of breast: Secondary | ICD-10-CM

## 2017-09-05 ENCOUNTER — Other Ambulatory Visit: Payer: Self-pay | Admitting: Family Medicine

## 2017-09-05 DIAGNOSIS — R928 Other abnormal and inconclusive findings on diagnostic imaging of breast: Secondary | ICD-10-CM

## 2017-09-14 DIAGNOSIS — N951 Menopausal and female climacteric states: Secondary | ICD-10-CM | POA: Diagnosis not present

## 2017-09-14 DIAGNOSIS — F338 Other recurrent depressive disorders: Secondary | ICD-10-CM | POA: Diagnosis not present

## 2017-09-14 DIAGNOSIS — N952 Postmenopausal atrophic vaginitis: Secondary | ICD-10-CM | POA: Diagnosis not present

## 2017-09-20 ENCOUNTER — Ambulatory Visit
Admission: RE | Admit: 2017-09-20 | Discharge: 2017-09-20 | Disposition: A | Discharge status: Home / Self Care | Payer: BLUE CROSS/BLUE SHIELD | Source: Ambulatory Visit | Attending: Family Medicine | Admitting: Family Medicine

## 2017-09-20 ENCOUNTER — Other Ambulatory Visit: Payer: Self-pay | Admitting: Family Medicine

## 2017-09-20 DIAGNOSIS — N6321 Unspecified lump in the left breast, upper outer quadrant: Secondary | ICD-10-CM | POA: Diagnosis not present

## 2017-09-20 DIAGNOSIS — R928 Other abnormal and inconclusive findings on diagnostic imaging of breast: Secondary | ICD-10-CM

## 2017-09-20 DIAGNOSIS — N6323 Unspecified lump in the left breast, lower outer quadrant: Secondary | ICD-10-CM | POA: Diagnosis not present

## 2017-09-20 DIAGNOSIS — N6320 Unspecified lump in the left breast, unspecified quadrant: Secondary | ICD-10-CM

## 2017-09-27 ENCOUNTER — Other Ambulatory Visit: Payer: Self-pay | Admitting: Family Medicine

## 2017-09-27 DIAGNOSIS — N6320 Unspecified lump in the left breast, unspecified quadrant: Secondary | ICD-10-CM

## 2017-10-06 ENCOUNTER — Encounter: Payer: Self-pay | Admitting: Family Medicine

## 2017-10-06 ENCOUNTER — Ambulatory Visit
Admission: RE | Admit: 2017-10-06 | Discharge: 2017-10-06 | Disposition: A | Discharge status: Home / Self Care | Payer: BLUE CROSS/BLUE SHIELD | Source: Ambulatory Visit | Attending: Family Medicine | Admitting: Family Medicine

## 2017-10-06 DIAGNOSIS — N6320 Unspecified lump in the left breast, unspecified quadrant: Secondary | ICD-10-CM

## 2017-10-06 DIAGNOSIS — N6002 Solitary cyst of left breast: Secondary | ICD-10-CM | POA: Diagnosis not present

## 2017-11-21 ENCOUNTER — Other Ambulatory Visit: Payer: Self-pay

## 2017-11-21 ENCOUNTER — Encounter: Payer: Self-pay | Admitting: Family Medicine

## 2017-11-21 ENCOUNTER — Ambulatory Visit: Payer: BLUE CROSS/BLUE SHIELD | Admitting: Family Medicine

## 2017-11-21 VITALS — BP 107/75 | HR 102 | Temp 98.2°F | Ht 62.0 in | Wt 159.1 lb

## 2017-11-21 DIAGNOSIS — L989 Disorder of the skin and subcutaneous tissue, unspecified: Secondary | ICD-10-CM | POA: Diagnosis not present

## 2017-11-21 DIAGNOSIS — J309 Allergic rhinitis, unspecified: Secondary | ICD-10-CM | POA: Diagnosis not present

## 2017-11-21 DIAGNOSIS — H6591 Unspecified nonsuppurative otitis media, right ear: Secondary | ICD-10-CM | POA: Diagnosis not present

## 2017-11-21 MED ORDER — PREDNISONE 10 MG PO TABS
ORAL_TABLET | ORAL | 0 refills | Status: DC
Start: 2017-11-21 — End: 2017-11-28

## 2017-11-21 MED ORDER — TRIAMCINOLONE ACETONIDE 0.1 % EX CREA: 1.0000 "application " | TOPICAL_CREAM | Freq: Two times a day (BID) | CUTANEOUS | 0 refills | Status: DC

## 2017-11-21 NOTE — Progress Notes (Signed)
BP 107/75   Pulse (!) 102   Temp 98.2 F (36.8 C) (Oral)   Ht 5\' 2"  (1.575 m)   Wt 159 lb 1.6 oz (72.2 kg)   SpO2 96%   BMI 29.10 kg/m    Subjective:    Patient ID: Martha Henry, female    DOB: 1965-06-02, 52 y.o.   MRN: [id number]  HPI: Martha Henry is a 52 y.o. female  Chief Complaint  Patient presents with  . Ear Pain    right side/ pt states it feels like vibrations and ecos since, first stated last wednesday   Right ear pain and dizziness, nausea x over a week. Went away for a few days but came back yesterday. Pain, echoing, muffled hearing. Sinus pain, pressure, congestion, sore throat. Hx of allergic rhinitis. Not taking anything for sxs other than her normal allergy regimen.   Place on left face that's been there at least 8 weeks. Thought it was a pimple but it hasn't healed. Has not been using anything on it. Denies itching, bleeding, growth.   Relevant past medical, surgical, family and social history reviewed and updated as indicated. Interim medical history since our last visit reviewed. Allergies and medications reviewed and updated.  Review of Systems  Per HPI unless specifically indicated above     Objective:    BP 107/75   Pulse (!) 102   Temp 98.2 F (36.8 C) (Oral)   Ht 5\' 2"  (1.575 m)   Wt 159 lb 1.6 oz (72.2 kg)   SpO2 96%   BMI 29.10 kg/m   Wt Readings from Last 3 Encounters:  11/21/17 159 lb 1.6 oz (72.2 kg)  07/18/17 160 lb 1.6 oz (72.6 kg)  05/25/17 157 lb 1.6 oz (71.3 kg)    Physical Exam  Constitutional: She is oriented to person, place, and time. She appears well-developed and well-nourished. No distress.  HENT:  Head: Atraumatic.  Right middle ear effusion Left ear WNL Nasal mucosa boggy and erythematous  Eyes: Pupils are equal, round, and reactive to light. Conjunctivae are normal.  Neck: Normal range of motion. Neck supple.  Cardiovascular: Normal rate, regular rhythm and normal heart sounds.  Pulmonary/Chest: Effort  normal and breath sounds normal. No respiratory distress.  Musculoskeletal: Normal range of motion.  Neurological: She is alert and oriented to person, place, and time.  Skin: Skin is warm and dry.  Erythematous maculopapular lesion left cheek  Psychiatric: She has a normal mood and affect. Her behavior is normal.  Nursing note and vitals reviewed.   Results for orders placed or performed in visit on 05/25/17  WET PREP FOR [place], YEAST, CLUE  Result Value Ref Range   Trichomonas Exam Negative Negative   Yeast Exam Negative Negative   Clue Cell Exam Positive (A) Negative  Microscopic Examination  Result Value Ref Range   WBC, UA 0-5 0 - 5 /hpf   RBC, UA 0-2 0 - 2 /hpf   Epithelial Cells (non renal) 0-10 0 - 10 /hpf   Bacteria, UA Moderate (A) None seen/Few  Urine Culture, Reflex  Result Value Ref Range   Urine Culture, Routine Final report    Organism ID, Bacteria Comment   UA/M w/rflx Culture, Routine  Result Value Ref Range   Specific Gravity, UA 1.020 1.005 - 1.030   pH, UA 6.0 5.0 - 7.5   Color, UA Yellow Yellow   Appearance Ur Turbid (A) Clear   Leukocytes, UA Negative Negative   Protein, UA  Negative Negative/Trace   Glucose, UA Negative Negative   Ketones, UA Negative Negative   RBC, UA Trace (A) Negative   Bilirubin, UA Negative Negative   Urobilinogen, Ur 0.2 0.2 - 1.0 mg/dL   Nitrite, UA Negative Negative   Microscopic Examination See below:    Urinalysis Reflex Comment       Assessment & Plan:   Problem List Items Addressed This Visit      Respiratory   Rhinitis, allergic    Increase zyrtec and flonase to BID.        Other Visit Diagnoses    Otitis media with effusion, right    -  Primary   Increase allergy regimen to BID, start prednisone, sudafed, sinus rinses. F/u if worsening or no improvement   Skin lesion       Triamcinolone given, if no improvement over next few weeks will send to Dermatology for bx       Follow up plan: Return in about 3  months (around 02/21/2018) for CPE.

## 2017-11-21 NOTE — Patient Instructions (Signed)
Tdap Vaccine (Tetanus, Diphtheria and Pertussis): What You Need to Know 1. Why get vaccinated? Tetanus, diphtheria and pertussis are very serious diseases. Tdap vaccine can protect us from these diseases. And, Tdap vaccine given to pregnant women can protect newborn babies against pertussis. TETANUS (Lockjaw) is rare in the [place] today. It causes painful muscle tightening and stiffness, usually all over the body.  It can lead to tightening of muscles in the head and neck so you can't open your mouth, swallow, or sometimes even breathe. Tetanus kills about 1 out of 10 people who are infected even after receiving the best medical care.  DIPHTHERIA is also rare in the [place] today. It can cause a thick coating to form in the back of the throat.  It can lead to breathing problems, heart failure, paralysis, and death.  PERTUSSIS (Whooping Cough) causes severe coughing spells, which can cause difficulty breathing, vomiting and disturbed sleep.  It can also lead to weight loss, incontinence, and rib fractures. Up to 2 in 100 adolescents and 5 in 100 adults with pertussis are hospitalized or have complications, which could include pneumonia or death.  These diseases are caused by bacteria. Diphtheria and pertussis are spread from person to person through secretions from coughing or sneezing. Tetanus enters the body through cuts, scratches, or wounds. Before vaccines, as many as 200,000 cases of diphtheria, 200,000 cases of pertussis, and hundreds of cases of tetanus, were reported in the [place] each year. Since vaccination began, reports of cases for tetanus and diphtheria have dropped by about 99% and for pertussis by about 80%. 2. Tdap vaccine Tdap vaccine can protect adolescents and adults from tetanus, diphtheria, and pertussis. One dose of Tdap is routinely given at age 11 or 12. People who did not get Tdap at that age should get it as soon as possible. Tdap is especially  important for healthcare professionals and anyone having close contact with a baby younger than 12 months. Pregnant women should get a dose of Tdap during every pregnancy, to protect the newborn from pertussis. Infants are most at risk for severe, life-threatening complications from pertussis. Another vaccine, called Td, protects against tetanus and diphtheria, but not pertussis. A Td booster should be given every 10 years. Tdap may be given as one of these boosters if you have never gotten Tdap before. Tdap may also be given after a severe cut or burn to prevent tetanus infection. Your doctor or the person giving you the vaccine can give you more information. Tdap may safely be given at the same time as other vaccines. 3. Some people should not get this vaccine  A person who has ever had a life-threatening allergic reaction after a previous dose of any diphtheria, tetanus or pertussis containing vaccine, OR has a severe allergy to any part of this vaccine, should not get Tdap vaccine. Tell the person giving the vaccine about any severe allergies.  Anyone who had coma or long repeated seizures within 7 days after a childhood dose of DTP or DTaP, or a previous dose of Tdap, should not get Tdap, unless a cause other than the vaccine was found. They can still get Td.  Talk to your doctor if you: ? have seizures or another nervous system problem, ? had severe pain or swelling after any vaccine containing diphtheria, tetanus or pertussis, ? ever had a condition called Guillain-Barr Syndrome (GBS), ? aren't feeling well on the day the shot is scheduled. 4. Risks With any medicine, including   vaccines, there is a chance of side effects. These are usually mild and go away on their own. Serious reactions are also possible but are rare. Most people who get Tdap vaccine do not have any problems with it. Mild problems following Tdap: (Did not interfere with activities)  Pain where the shot was given (about  3 in 4 adolescents or 2 in 3 adults)  Redness or swelling where the shot was given (about 1 person in 5)  Mild fever of at least 100.4F (up to about 1 in 25 adolescents or 1 in 100 adults)  Headache (about 3 or 4 people in 10)  Tiredness (about 1 person in 3 or 4)  Nausea, vomiting, diarrhea, stomach ache (up to 1 in 4 adolescents or 1 in 10 adults)  Chills, sore joints (about 1 person in 10)  Body aches (about 1 person in 3 or 4)  Rash, swollen glands (uncommon)  Moderate problems following Tdap: (Interfered with activities, but did not require medical attention)  Pain where the shot was given (up to 1 in 5 or 6)  Redness or swelling where the shot was given (up to about 1 in 16 adolescents or 1 in 12 adults)  Fever over 102F (about 1 in 100 adolescents or 1 in 250 adults)  Headache (about 1 in 7 adolescents or 1 in 10 adults)  Nausea, vomiting, diarrhea, stomach ache (up to 1 or 3 people in 100)  Swelling of the entire arm where the shot was given (up to about 1 in 500).  Severe problems following Tdap: (Unable to perform usual activities; required medical attention)  Swelling, severe pain, bleeding and redness in the arm where the shot was given (rare).  Problems that could happen after any vaccine:  People sometimes faint after a medical procedure, including vaccination. Sitting or lying down for about 15 minutes can help prevent fainting, and injuries caused by a fall. Tell your doctor if you feel dizzy, or have vision changes or ringing in the ears.  Some people get severe pain in the shoulder and have difficulty moving the arm where a shot was given. This happens very rarely.  Any medication can cause a severe allergic reaction. Such reactions from a vaccine are very rare, estimated at fewer than 1 in a million doses, and would happen within a few minutes to a few hours after the vaccination. As with any medicine, there is a very remote chance of a vaccine  causing a serious injury or death. The safety of vaccines is always being monitored. For more information, visit: www.cdc.gov/vaccinesafety/ 5. What if there is a serious problem? What should I look for? Look for anything that concerns you, such as signs of a severe allergic reaction, very high fever, or unusual behavior. Signs of a severe allergic reaction can include hives, swelling of the face and throat, difficulty breathing, a fast heartbeat, dizziness, and weakness. These would usually start a few minutes to a few hours after the vaccination. What should I do?  If you think it is a severe allergic reaction or other emergency that can't wait, call 9-1-1 or get the person to the nearest hospital. Otherwise, call your doctor.  Afterward, the reaction should be reported to the Vaccine Adverse Event Reporting System (VAERS). Your doctor might file this report, or you can do it yourself through the VAERS web site at www.vaers.hhs.gov, or by calling 1-800-822-7967. ? VAERS does not give medical advice. 6. The National Vaccine Injury Compensation Program The National   Vaccine Injury Compensation Program (VICP) is a federal program that was created to compensate people who may have been injured by certain vaccines. Persons who believe they may have been injured by a vaccine can learn about the program and about filing a claim by calling 1-800-338-2382 or visiting the VICP website at www.hrsa.gov/vaccinecompensation. There is a time limit to file a claim for compensation. 7. How can I learn more?  Ask your doctor. He or she can give you the vaccine package insert or suggest other sources of information.  Call your local or state health department.  Contact the Centers for Disease Control and Prevention (CDC): ? Call 1-800-232-4636 (1-800-CDC-INFO) or ? Visit CDC's website at www.cdc.gov/vaccines CDC Tdap Vaccine VIS (06/26/13) This information is not intended to replace advice given to you by your  health care provider. Make sure you discuss any questions you have with your health care provider. Document Released: 10/19/2011 Document Revised: 01/08/2016 Document Reviewed: 01/08/2016 Elsevier Interactive Patient Education  2017 Elsevier Inc.  

## 2017-11-24 NOTE — Assessment & Plan Note (Signed)
Increase zyrtec and flonase to BID.

## 2017-11-28 ENCOUNTER — Encounter: Payer: Self-pay | Admitting: Family Medicine

## 2017-11-28 ENCOUNTER — Ambulatory Visit: Payer: Self-pay | Admitting: *Deleted

## 2017-11-28 ENCOUNTER — Ambulatory Visit: Payer: BLUE CROSS/BLUE SHIELD | Admitting: Family Medicine

## 2017-11-28 VITALS — BP 115/79 | HR 90 | Temp 98.4°F | Wt 159.0 lb

## 2017-11-28 DIAGNOSIS — R5383 Other fatigue: Secondary | ICD-10-CM

## 2017-11-28 DIAGNOSIS — J069 Acute upper respiratory infection, unspecified: Secondary | ICD-10-CM

## 2017-11-28 DIAGNOSIS — R42 Dizziness and giddiness: Secondary | ICD-10-CM | POA: Diagnosis not present

## 2017-11-28 MED ORDER — FLUCONAZOLE 150 MG PO TABS: 150.0000 mg | ORAL_TABLET | Freq: Once | ORAL | 0 refills | Status: AC

## 2017-11-28 MED ORDER — AMOXICILLIN-POT CLAVULANATE 875-125 MG PO TABS: 1.0000 | ORAL_TABLET | Freq: Two times a day (BID) | ORAL | 0 refills | Status: DC

## 2017-11-28 NOTE — Telephone Encounter (Signed)
Patient was seen last Monday for ear pain, patient is now having body pains, sore throat and just really feels bad. Has finished prednisone. Please advise  Patient is calling to report that she is having increased symptoms- appointment made for evaluation- she may needs antibiotics. Reason for Disposition . Earache also present  Answer Assessment - Initial Assessment Questions 1. ONSET: "When did the throat start hurting?" (Hours or days ago)      yesterday 2. SEVERITY: "How bad is the sore throat?" (Scale 1-10; mild, moderate or severe)   - MILD (1-3):  doesn't interfere with eating or normal activities   - MODERATE (4-7): interferes with eating some solids and normal activities   - SEVERE (8-10):  excruciating pain, interferes with most normal activities   - SEVERE DYSPHAGIA: can't swallow liquids, drooling     8 3. STREP EXPOSURE: "Has there been any exposure to strep within the past week?" If so, ask: "What type of contact occurred?"      no 4.  VIRAL SYMPTOMS: "Are there any symptoms of a cold, such as a runny nose, cough, hoarse voice or red eyes?"      Patient has ear pain- patient was seen last week 5. FEVER: "Do you have a fever?" If so, ask: "What is your temperature, how was it measured, and when did it start?"     no 6. PUS ON THE TONSILS: "Is there pus on the tonsils in the back of your throat?"     Patient has not checked throat 7. OTHER SYMPTOMS: "Do you have any other symptoms?" (e.g., difficulty breathing, headache, rash)     no 8. PREGNANCY: "Is there any chance you are pregnant?" "When was your last menstrual period?"     No- no cycles  Protocols used: SORE THROAT-A-AH

## 2017-11-28 NOTE — Progress Notes (Signed)
BP 115/79   Pulse 90   Temp 98.4 F (36.9 C) (Oral)   Wt 159 lb (72.1 kg)   SpO2 100%   BMI 29.08 kg/m    Subjective:    Patient ID: Martha Henry, female    DOB: 1965-06-28, 52 y.o.   MRN: [id number]  HPI: CARLOYN Henry is a 52 y.o. female  Chief Complaint  Patient presents with  . Sore Throat  . Ear Pain   Sore swollen throat, chest soreness, fatigue, shoulder soreness, ear pain and pressure, vertigo x 1-2 weeks. Completed prednisone course last week given for viral URI sxs but since stopping has had returning worse sxs. Not taking anything OTC currently. Denies known fevers, chills, SOB.   Relevant past medical, surgical, family and social history reviewed and updated as indicated. Interim medical history since our last visit reviewed. Allergies and medications reviewed and updated.  Review of Systems  Per HPI unless specifically indicated above     Objective:    BP 115/79   Pulse 90   Temp 98.4 F (36.9 C) (Oral)   Wt 159 lb (72.1 kg)   SpO2 100%   BMI 29.08 kg/m   Wt Readings from Last 3 Encounters:  11/28/17 159 lb (72.1 kg)  11/21/17 159 lb 1.6 oz (72.2 kg)  07/18/17 160 lb 1.6 oz (72.6 kg)    Physical Exam  Constitutional: She is oriented to person, place, and time. She appears well-developed and well-nourished. She does not appear ill. No distress.  HENT:  Head: Atraumatic.  B/l middle ear effusion Oropharynx erythematous and edematous, no exudates noted  Eyes: Pupils are equal, round, and reactive to light. Conjunctivae and EOM are normal.  Neck: Normal range of motion. Neck supple.  Cardiovascular: Normal rate and regular rhythm.  Pulmonary/Chest: Effort normal and breath sounds normal.  Musculoskeletal: Normal range of motion.  Neurological: She is alert and oriented to person, place, and time.  Skin: Skin is warm and dry.  Psychiatric: She has a normal mood and affect. Her behavior is normal.  Nursing note and vitals  reviewed.   Results for orders placed or performed in visit on 05/25/17  WET PREP FOR [place], YEAST, CLUE  Result Value Ref Range   Trichomonas Exam Negative Negative   Yeast Exam Negative Negative   Clue Cell Exam Positive (A) Negative  Microscopic Examination  Result Value Ref Range   WBC, UA 0-5 0 - 5 /hpf   RBC, UA 0-2 0 - 2 /hpf   Epithelial Cells (non renal) 0-10 0 - 10 /hpf   Bacteria, UA Moderate (A) None seen/Few  Urine Culture, Reflex  Result Value Ref Range   Urine Culture, Routine Final report    Organism ID, Bacteria Comment   UA/M w/rflx Culture, Routine  Result Value Ref Range   Specific Gravity, UA 1.020 1.005 - 1.030   pH, UA 6.0 5.0 - 7.5   Color, UA Yellow Yellow   Appearance Ur Turbid (A) Clear   Leukocytes, UA Negative Negative   Protein, UA Negative Negative/Trace   Glucose, UA Negative Negative   Ketones, UA Negative Negative   RBC, UA Trace (A) Negative   Bilirubin, UA Negative Negative   Urobilinogen, Ur 0.2 0.2 - 1.0 mg/dL   Nitrite, UA Negative Negative   Microscopic Examination See below:    Urinalysis Reflex Comment       Assessment & Plan:   Problem List Items Addressed This Visit    None  Visit Diagnoses    Upper respiratory tract infection, unspecified type    -  Primary   Tx with augmentin, allergy regimen, sinus rinses, mucinex. Rest, push fluids. F/u if worsening or no improvement   Vertigo       Fatigue, unspecified type           Follow up plan: Return if symptoms worsen or fail to improve.

## 2017-11-29 NOTE — Patient Instructions (Signed)
Follow up as needed

## 2018-01-16 ENCOUNTER — Encounter: Payer: Self-pay | Admitting: Family Medicine

## 2018-01-23 DIAGNOSIS — D485 Neoplasm of uncertain behavior of skin: Secondary | ICD-10-CM | POA: Diagnosis not present

## 2018-01-23 DIAGNOSIS — L929 Granulomatous disorder of the skin and subcutaneous tissue, unspecified: Secondary | ICD-10-CM | POA: Diagnosis not present

## 2018-02-22 ENCOUNTER — Ambulatory Visit (INDEPENDENT_AMBULATORY_CARE_PROVIDER_SITE_OTHER): Payer: BLUE CROSS/BLUE SHIELD | Admitting: Family Medicine

## 2018-02-22 ENCOUNTER — Encounter: Payer: Self-pay | Admitting: Family Medicine

## 2018-02-22 VITALS — BP 116/79 | HR 106 | Ht 63.0 in | Wt 158.0 lb

## 2018-02-22 DIAGNOSIS — N393 Stress incontinence (female) (male): Secondary | ICD-10-CM

## 2018-02-22 DIAGNOSIS — Z23 Encounter for immunization: Secondary | ICD-10-CM

## 2018-02-22 DIAGNOSIS — Z0000 Encounter for general adult medical examination without abnormal findings: Secondary | ICD-10-CM

## 2018-02-22 DIAGNOSIS — J309 Allergic rhinitis, unspecified: Secondary | ICD-10-CM

## 2018-02-22 DIAGNOSIS — K219 Gastro-esophageal reflux disease without esophagitis: Secondary | ICD-10-CM | POA: Diagnosis not present

## 2018-02-22 DIAGNOSIS — Z1239 Encounter for other screening for malignant neoplasm of breast: Secondary | ICD-10-CM

## 2018-02-22 DIAGNOSIS — E039 Hypothyroidism, unspecified: Secondary | ICD-10-CM

## 2018-02-22 DIAGNOSIS — Z862 Personal history of diseases of the blood and blood-forming organs and certain disorders involving the immune mechanism: Secondary | ICD-10-CM

## 2018-02-22 DIAGNOSIS — Z114 Encounter for screening for human immunodeficiency virus [HIV]: Secondary | ICD-10-CM

## 2018-02-22 LAB — UA/M W/RFLX CULTURE, ROUTINE
BILIRUBIN UA: NEGATIVE
Glucose, UA: NEGATIVE
Ketones, UA: NEGATIVE
LEUKOCYTES UA: NEGATIVE
Nitrite, UA: NEGATIVE
PH UA: 6 (ref 5.0–7.5)
PROTEIN UA: NEGATIVE
Specific Gravity, UA: <1.005 — ABNORMAL LOW (ref 1.005–1.030)
Urobilinogen, Ur: 0.2 mg/dL (ref 0.2–1.0)

## 2018-02-22 LAB — MICROSCOPIC EXAMINATION

## 2018-02-22 MED ORDER — IMIPRAMINE HCL 25 MG PO TABS: 25.0000 mg | ORAL_TABLET | Freq: Every day | ORAL | 3 refills | Status: DC

## 2018-02-22 MED ORDER — LEVOTHYROXINE SODIUM 50 MCG PO CAPS: 50.0000 ug | ORAL_CAPSULE | Freq: Every day | ORAL | 3 refills | Status: DC

## 2018-02-22 MED ORDER — LANSOPRAZOLE 30 MG PO CPDR: 30.0000 mg | DELAYED_RELEASE_CAPSULE | Freq: Every day | ORAL | 11 refills | Status: DC

## 2018-02-22 MED ORDER — FLUTICASONE PROPIONATE 50 MCG/ACT NA SUSP: 2.0000 | Freq: Two times a day (BID) | NASAL | 11 refills | Status: DC

## 2018-02-22 NOTE — Patient Instructions (Addendum)
pepcid twice daily as neededTd Vaccine (Tetanus and Diphtheria): What You Need to Know 1. Why get vaccinated? Tetanus  and diphtheria are very serious diseases. They are rare in the [place] today, but people who do become infected often have severe complications. Td vaccine is used to protect adolescents and adults from both of these diseases. Both tetanus and diphtheria are infections caused by bacteria. Diphtheria spreads from person to person through coughing or sneezing. Tetanus-causing bacteria enter the body through cuts, scratches, or wounds. TETANUS (lockjaw) causes painful muscle tightening and stiffness, usually all over the body.  It can lead to tightening of muscles in the head and neck so you can't open your mouth, swallow, or sometimes even breathe. Tetanus kills about 1 out of every 10 people who are infected even after receiving the best medical care.  DIPHTHERIA can cause a thick coating to form in the back of the throat.  It can lead to breathing problems, paralysis, heart failure, and death.  Before vaccines, as many as 200,000 cases of diphtheria and hundreds of cases of tetanus were reported in the [place] each year. Since vaccination began, reports of cases for both diseases have dropped by about 99%. 2. Td vaccine Td vaccine can protect adolescents and adults from tetanus and diphtheria. Td is usually given as a booster dose every 10 years but it can also be given earlier after a severe and dirty wound or burn. Another vaccine, called Tdap, which protects against pertussis in addition to tetanus and diphtheria, is sometimes recommended instead of Td vaccine. Your doctor or the person giving you the vaccine can give you more information. Td may safely be given at the same time as other vaccines. 3. Some people should not get this vaccine  A person who has ever had a life-threatening allergic reaction after a previous dose of any tetanus or diphtheria containing  vaccine, OR has a severe allergy to any part of this vaccine, should not get Td vaccine. Tell the person giving the vaccine about any severe allergies.  Talk to your doctor if you: ? had severe pain or swelling after any vaccine containing diphtheria or tetanus, ? ever had a condition called Guillain Barre Syndrome (GBS), ? aren't feeling well on the day the shot is scheduled. 4. What are the risks from Td vaccine? With any medicine, including vaccines, there is a chance of side effects. These are usually mild and go away on their own. Serious reactions are also possible but are rare. Most people who get Td vaccine do not have any problems with it. Mild problems following Td vaccine: (Did not interfere with activities)  Pain where the shot was given (about 8 people in 10)  Redness or swelling where the shot was given (about 1 person in 4)  Mild fever (rare)  Headache (about 1 person in 4)  Tiredness (about 1 person in 4)  Moderate problems following Td vaccine: (Interfered with activities, but did not require medical attention)  Fever over 102F (rare)  Severe problems following Td vaccine: (Unable to perform usual activities; required medical attention)  Swelling, severe pain, bleeding and/or redness in the arm where the shot was given (rare).  Problems that could happen after any vaccine:  People sometimes faint after a medical procedure, including vaccination. Sitting or lying down for about 15 minutes can help prevent fainting, and injuries caused by a fall. Tell your doctor if you feel dizzy, or have vision changes or ringing in the ears.  Some people get severe pain in the shoulder and have difficulty moving the arm where a shot was given. This happens very rarely.  Any medication can cause a severe allergic reaction. Such reactions from a vaccine are very rare, estimated at fewer than 1 in a million doses, and would happen within a few minutes to a few hours after the  vaccination. As with any medicine, there is a very remote chance of a vaccine causing a serious injury or death. The safety of vaccines is always being monitored. For more information, visit: http://www.aguilar.org/ 5. What if there is a serious reaction? What should I look for? Look for anything that concerns you, such as signs of a severe allergic reaction, very high fever, or unusual behavior. Signs of a severe allergic reaction can include hives, swelling of the face and throat, difficulty breathing, a fast heartbeat, dizziness, and weakness. These would usually start a few minutes to a few hours after the vaccination. What should I do?  If you think it is a severe allergic reaction or other emergency that can't wait, call 9-1-1 or get the person to the nearest hospital. Otherwise, call your doctor.  Afterward, the reaction should be reported to the Vaccine Adverse Event Reporting System (VAERS). Your doctor might file this report, or you can do it yourself through the VAERS web site at www.vaers.SamedayNews.es, or by calling 718-366-1332. ? VAERS does not give medical advice. 6. The National Vaccine Injury Compensation Program The [employer] Vaccine Injury Compensation Program (VICP) is a federal program that was created to compensate people who may have been injured by certain vaccines. Persons who believe they may have been injured by a vaccine can learn about the program and about filing a claim by calling 2566975151 or visiting the [place] website at GoldCloset.com.ee. There is a time limit to file a claim for compensation. 7. How can I learn more?  Ask your doctor. He or she can give you the vaccine package insert or suggest other sources of information.  Call your local or state health department.  Contact the Centers for Disease Control and Prevention (CDC): ? Call 217-092-9643 (1-800-CDC-INFO) ? Visit CDC's website at http://hunter.com/ CDC Td Vaccine VIS  (08/12/15) This information is not intended to replace advice given to you by your health care provider. Make sure you discuss any questions you have with your health care provider. Document Released: 02/14/2006 Document Revised: 01/08/2016 Document Reviewed: 01/08/2016 Elsevier Interactive Patient Education  2017 [employer].

## 2018-02-22 NOTE — Progress Notes (Signed)
BP 116/79   Pulse (!) 106   Ht 5\' 3"  (1.6 m)   Wt 158 lb (71.7 kg)   SpO2 98%   BMI 27.99 kg/m    Subjective:    Patient ID: Martha Henry, female    DOB: 1965-09-20, 52 y.o.   MRN: [id number]  HPI: Martha Henry is a 52 y.o. female presenting on 02/22/2018 for comprehensive medical examination. Current medical complaints include:see below  Previously followed by Endocrinology for hypothyroidism but provider retired, currently on 50 mg synthroid. Wanting to be managed here now for this. Feeling well on this dose.   Hx of ITP, previously followed by Hematology. Has been stable the past few years.   Previously followed by Urology for stress incontinence, provider retired. Managed on tofranil with some relief. No concerns.   On prevacid 15 mg for GERD, has been having breakthrough sxs the past month or so and switched to zantac BID with some relief but does not want to keep taking because of the recall. Having belching, indigestion, burning in throat.   She currently lives with: Menopausal Symptoms: no  Depression Screen done today and results listed below:  Depression screen [facility] 2/9 02/22/2018 11/21/2017  Decreased Interest 0 0  Down, Depressed, Hopeless 0 0  PHQ - 2 Score 0 0  Altered sleeping 0 1  Tired, decreased energy 0 1  Change in appetite 0 1  Feeling bad or failure about yourself  - 0  Trouble concentrating 0 1  Moving slowly or fidgety/restless 0 0  Suicidal thoughts 0 0  PHQ-9 Score 0 4  Difficult doing work/chores - Not difficult at all    The patient does not have a history of falls. I did not complete a risk assessment for falls. A plan of care for falls was not documented.   Past Medical History:  Past Medical History:  Diagnosis Date  . GERD (gastroesophageal reflux disease)   . Headache    sinus  . History of abnormal mammogram    seen by Dr. Jamal Collin  . Hypothyroidism   . ITP (idiopathic thrombocytopenic purpura) 1990   no issues in several  years  . Tubular adenoma of colon 2016  . Wears contact lenses     Surgical History:  Past Surgical History:  Procedure Laterality Date  . ABDOMINAL HYSTERECTOMY  2003   partial/only ovaries remain  . APPENDECTOMY  2009  . CHOLECYSTECTOMY  2009  . COLONOSCOPY WITH PROPOFOL N/A 02/26/2015   Procedure: COLONOSCOPY WITH PROPOFOL;  Surgeon: Christene Lye, MD;  Location: [facility] ENDOSCOPY;  Service: Endoscopy;  Laterality: N/A;  . NASAL TURBINATE REDUCTION Bilateral 05/02/2015   Procedure: TURBINATE REDUCTION/SUBMUCOSAL RESECTION;  Surgeon: Beverly Gust, MD;  Location: [place];  Service: ENT;  Laterality: Bilateral;  . SEPTOPLASTY N/A 05/02/2015   Procedure: SEPTOPLASTY;  Surgeon: Beverly Gust, MD;  Location: [place];  Service: ENT;  Laterality: N/A;  . TONSILLECTOMY  1975    Medications:  Current Outpatient Medications on File Prior to Visit  Medication Sig  . Calcium-Vitamin D-Vitamin K (VIACTIV) [phone] MG-UNT-MCG CHEW Chew by mouth.  . cetirizine (ZYRTEC) 10 MG tablet Take 10 mg by mouth daily.  . Cyanocobalamin (VITAMIN B-12 PO) Take by mouth daily.  . Multiple Vitamins-Minerals (MULTIVITAMIN WITH MINERALS) tablet Take 1 tablet by mouth daily.  . sennosides-docusate sodium (SENOKOT-S) 8.6-50 MG tablet Take 1 tablet by mouth daily.  . Turmeric Curcumin 500 MG CAPS Take by mouth.  . Lactobacillus (DIGESTIVE  HEALTH PROBIOTIC PO) Take by mouth daily.   No current facility-administered medications on file prior to visit.     Allergies:  Allergies  Allergen Reactions  . Erythromycin Other (See Comments)    Chest pain  . Aspirin Other (See Comments)    Platelet problems  . Ciprofloxacin Other (See Comments)    constipation    Social History:  Social History   Socioeconomic History  . Marital status: Married    Spouse name: Not on file  . Number of children: Not on file  . Years of education: Not on file  . Highest education level:  Not on file  Occupational History  . Not on file  Social Needs  . Financial resource strain: Not on file  . Food insecurity:    Worry: Not on file    Inability: Not on file  . Transportation needs:    Medical: Not on file    Non-medical: Not on file  Tobacco Use  . Smoking status: Never Smoker  . Smokeless tobacco: Never Used  Substance and Sexual Activity  . Alcohol use: No  . Drug use: No  . Sexual activity: Not on file  Lifestyle  . Physical activity:    Days per week: Not on file    Minutes per session: Not on file  . Stress: Not on file  Relationships  . Social connections:    Talks on phone: Not on file    Gets together: Not on file    Attends religious service: Not on file    Active member of club or organization: Not on file    Attends meetings of clubs or organizations: Not on file    Relationship status: Not on file  . Intimate partner violence:    Fear of current or ex partner: Not on file    Emotionally abused: Not on file    Physically abused: Not on file    Forced sexual activity: Not on file  Other Topics Concern  . Not on file  Social History Narrative  . Not on file   Social History   Tobacco Use  Smoking Status Never Smoker  Smokeless Tobacco Never Used   Social History   Substance and Sexual Activity  Alcohol Use No    Family History:  Family History  Problem Relation Age of Onset  . Cancer Mother        breast  . Colon cancer Father 86  . Cancer Father        colon  . Breast cancer Maternal Aunt   . Cancer Maternal Aunt        breast  . Lung cancer Maternal Grandfather   . Cervical cancer Maternal Grandmother     Past medical history, surgical history, medications, allergies, family history and social history reviewed with patient today and changes made to appropriate areas of the chart.   Review of Systems - General ROS: negative Psychological ROS: negative Ophthalmic ROS: negative ENT ROS: negative Allergy and Immunology  ROS: negative Hematological and Lymphatic ROS: negative Endocrine ROS: negative Breast ROS: negative for breast lumps Respiratory ROS: no cough, shortness of breath, or wheezing Cardiovascular ROS: no chest pain or dyspnea on exertion Gastrointestinal ROS: positive for - heartburn Genito-Urinary ROS: positive for - incontinence Musculoskeletal ROS: negative Neurological ROS: no TIA or stroke symptoms Dermatological ROS: negative All other ROS negative except what is listed above and in the HPI.      Objective:    BP 116/79  Pulse (!) 106   Ht 5\' 3"  (1.6 m)   Wt 158 lb (71.7 kg)   SpO2 98%   BMI 27.99 kg/m   Wt Readings from Last 3 Encounters:  02/22/18 158 lb (71.7 kg)  11/28/17 159 lb (72.1 kg)  11/21/17 159 lb 1.6 oz (72.2 kg)    Physical Exam  Constitutional: She is oriented to person, place, and time. She appears well-developed and well-nourished. No distress.  HENT:  Head: Atraumatic.  Right Ear: External ear normal.  Left Ear: External ear normal.  Nose: Nose normal.  Mouth/Throat: Oropharynx is clear and moist. No oropharyngeal exudate.  Eyes: Pupils are equal, round, and reactive to light. Conjunctivae are normal. No scleral icterus.  Neck: Normal range of motion. Neck supple. No thyromegaly present.  Cardiovascular: Normal rate, regular rhythm, normal heart sounds and intact distal pulses.  Pulmonary/Chest: Effort normal and breath sounds normal. No respiratory distress. Right breast exhibits no mass, no skin change and no tenderness. Left breast exhibits no mass, no skin change and no tenderness.  Abdominal: Soft. Bowel sounds are normal. She exhibits no mass. There is no tenderness.  Musculoskeletal: Normal range of motion. She exhibits no edema or tenderness.  Lymphadenopathy:    She has no cervical adenopathy.  Neurological: She is alert and oriented to person, place, and time. No cranial nerve deficit.  Skin: Skin is warm and dry. No rash noted.    Psychiatric: She has a normal mood and affect. Her behavior is normal.  Nursing note and vitals reviewed.   Results for orders placed or performed in visit on 02/22/18  Microscopic Examination  Result Value Ref Range   WBC, UA 0-5 0 - 5 /hpf   RBC, UA 0-2 0 - 2 /hpf   Epithelial Cells (non renal) 0-10 0 - 10 /hpf   Bacteria, UA Few None seen/Few  HIV antibody (with reflex)  Result Value Ref Range   HIV Screen 4th Generation wRfx Non Reactive Non Reactive  CBC with Differential/Platelet  Result Value Ref Range   WBC 8.0 3.4 - 10.8 x10E3/uL   RBC 4.65 3.77 - 5.28 x10E6/uL   Hemoglobin 12.6 11.1 - 15.9 g/dL   Hematocrit 38.6 34.0 - 46.6 %   MCV 83 79 - 97 fL   MCH 27.1 26.6 - 33.0 pg   MCHC 32.6 31.5 - 35.7 g/dL   RDW 12.3 12.3 - 15.4 %   Platelets 343 150 - 450 x10E3/uL   Neutrophils 50 Not Estab. %   Lymphs 38 Not Estab. %   Monocytes 8 Not Estab. %   Eos 2 Not Estab. %   Basos 1 Not Estab. %   Neutrophils Absolute 4.1 1.4 - 7.0 x10E3/uL   Lymphocytes Absolute 3.0 0.7 - 3.1 x10E3/uL   Monocytes Absolute 0.6 0.1 - 0.9 x10E3/uL   EOS (ABSOLUTE) 0.2 0.0 - 0.4 x10E3/uL   Basophils Absolute 0.1 0.0 - 0.2 x10E3/uL   Immature Granulocytes 1 Not Estab. %   Immature Grans (Abs) 0.0 0.0 - 0.1 x10E3/uL  Comprehensive metabolic panel  Result Value Ref Range   Glucose 90 65 - 99 mg/dL   BUN 9 6 - 24 mg/dL   Creatinine, Ser 0.81 0.57 - 1.00 mg/dL   GFR calc non Af Amer 85 >59 mL/min/1.73   GFR calc Af Amer 98 >59 mL/min/1.73   BUN/Creatinine Ratio 11 9 - 23   Sodium 139 134 - 144 mmol/L   Potassium 4.1 3.5 - 5.2 mmol/L   Chloride  103 96 - 106 mmol/L   CO2 23 20 - 29 mmol/L   Calcium 9.3 8.7 - 10.2 mg/dL   Total Protein 6.5 6.0 - 8.5 g/dL   Albumin 4.0 3.5 - 5.5 g/dL   Globulin, Total 2.5 1.5 - 4.5 g/dL   Albumin/Globulin Ratio 1.6 1.2 - 2.2   Bilirubin Total 0.2 0.0 - 1.2 mg/dL   Alkaline Phosphatase 83 39 - 117 IU/L   AST 16 0 - 40 IU/L   ALT 13 0 - 32 IU/L  Lipid Panel  w/o Chol/HDL Ratio  Result Value Ref Range   Cholesterol, Total 156 100 - 199 mg/dL   Triglycerides 142 0 - 149 mg/dL   HDL 57 >39 mg/dL   VLDL Cholesterol Cal 28 5 - 40 mg/dL   LDL Calculated 71 0 - 99 mg/dL  TSH  Result Value Ref Range   TSH 0.774 0.450 - 4.500 uIU/mL  UA/M w/rflx Culture, Routine  Result Value Ref Range   Specific Gravity, UA <1.005 (L) 1.005 - 1.030   pH, UA 6.0 5.0 - 7.5   Color, UA Yellow Yellow   Appearance Ur Cloudy (A) Clear   Leukocytes, UA Negative Negative   Protein, UA Negative Negative/Trace   Glucose, UA Negative Negative   Ketones, UA Negative Negative   RBC, UA Trace (A) Negative   Bilirubin, UA Negative Negative   Urobilinogen, Ur 0.2 0.2 - 1.0 mg/dL   Nitrite, UA Negative Negative   Microscopic Examination See below:       Assessment & Plan:   Problem List Items Addressed This Visit      Respiratory   Rhinitis, allergic    Stable on flonase and zyrtec, continue current regimen        Digestive   GERD (gastroesophageal reflux disease)    Increase prevacid to 30 mg and start BID prn pepcid. Work on diet modifications, increase exercise      Relevant Medications   sennosides-docusate sodium (SENOKOT-S) 8.6-50 MG tablet   lansoprazole (PREVACID) 30 MG capsule     Endocrine   Hypothyroidism - Primary    Will take over management while stable. Recheck TSH today, refill synthroid and adjust as needed pending results.       Relevant Medications   Levothyroxine Sodium 50 MCG CAPS   Other Relevant Orders   TSH (Completed)     Other   History of ITP    Will recheck CBC today. Stable      Female stress incontinence    Stable on tofranil, will take over management of her urinary issues while stable. Refills sent       Other Visit Diagnoses    Annual physical exam       Relevant Orders   CBC with Differential/Platelet (Completed)   Comprehensive metabolic panel (Completed)   Lipid Panel w/o Chol/HDL Ratio (Completed)   UA/M  w/rflx Culture, Routine (Completed)   Need for Tdap vaccination       Relevant Orders   Td : Tetanus/diphtheria >7yo Preservative  free (Completed)   Needs flu shot       Screening for breast cancer       Relevant Orders   MM DIGITAL SCREENING BILATERAL   Encounter for screening for HIV       Relevant Orders   HIV antibody (with reflex) (Completed)       Follow up plan: Return in about 6 months (around 08/24/2018) for 6 month f/u.   LABORATORY TESTING:  -  Pap smear: up to date  IMMUNIZATIONS:   - Tdap: Tetanus vaccination status reviewed: last tetanus booster within 10 years. - Influenza: Up to date  SCREENING: -Mammogram: Up to date  - Colonoscopy: Up to date   PATIENT COUNSELING:   Advised to take 1 mg of folate supplement per day if capable of pregnancy.   Sexuality: Discussed sexually transmitted diseases, partner selection, use of condoms, avoidance of unintended pregnancy  and contraceptive alternatives.   Advised to avoid cigarette smoking.  I discussed with the patient that most people either abstain from alcohol or drink within safe limits (<=14/week and <=4 drinks/occasion for males, <=7/weeks and <= 3 drinks/occasion for females) and that the risk for alcohol disorders and other health effects rises proportionally with the number of drinks per week and how often a drinker exceeds daily limits.  Discussed cessation/primary prevention of drug use and availability of treatment for abuse.   Diet: Encouraged to adjust caloric intake to maintain  or achieve ideal body weight, to reduce intake of dietary saturated fat and total fat, to limit sodium intake by avoiding high sodium foods and not adding table salt, and to maintain adequate dietary potassium and calcium preferably from fresh fruits, vegetables, and low-fat dairy products.    stressed the importance of regular exercise  Injury prevention: Discussed safety belts, safety helmets, smoke detector, smoking near  bedding or upholstery.   Dental health: Discussed importance of regular tooth brushing, flossing, and dental visits.    NEXT PREVENTATIVE PHYSICAL DUE IN 1 YEAR. Return in about 6 months (around 08/24/2018) for 6 month f/u.

## 2018-02-23 LAB — CBC WITH DIFFERENTIAL/PLATELET
BASOS ABS: 0.1 10*3/uL (ref 0.0–0.2)
Basos: 1 %
EOS (ABSOLUTE): 0.2 10*3/uL (ref 0.0–0.4)
Eos: 2 %
HEMATOCRIT: 38.6 % (ref 34.0–46.6)
Hemoglobin: 12.6 g/dL (ref 11.1–15.9)
Immature Grans (Abs): 0 10*3/uL (ref 0.0–0.1)
Immature Granulocytes: 1 %
LYMPHS ABS: 3 10*3/uL (ref 0.7–3.1)
Lymphs: 38 %
MCH: 27.1 pg (ref 26.6–33.0)
MCHC: 32.6 g/dL (ref 31.5–35.7)
MCV: 83 fL (ref 79–97)
MONOS ABS: 0.6 10*3/uL (ref 0.1–0.9)
Monocytes: 8 %
NEUTROS ABS: 4.1 10*3/uL (ref 1.4–7.0)
Neutrophils: 50 %
Platelets: 343 10*3/uL (ref 150–450)
RBC: 4.65 x10E6/uL (ref 3.77–5.28)
RDW: 12.3 % (ref 12.3–15.4)
WBC: 8 10*3/uL (ref 3.4–10.8)

## 2018-02-23 LAB — COMPREHENSIVE METABOLIC PANEL
ALBUMIN: 4 g/dL (ref 3.5–5.5)
ALK PHOS: 83 IU/L (ref 39–117)
ALT: 13 IU/L (ref 0–32)
AST: 16 IU/L (ref 0–40)
Albumin/Globulin Ratio: 1.6 (ref 1.2–2.2)
BILIRUBIN TOTAL: 0.2 mg/dL (ref 0.0–1.2)
BUN / CREAT RATIO: 11 (ref 9–23)
BUN: 9 mg/dL (ref 6–24)
CHLORIDE: 103 mmol/L (ref 96–106)
CO2: 23 mmol/L (ref 20–29)
CREATININE: 0.81 mg/dL (ref 0.57–1.00)
Calcium: 9.3 mg/dL (ref 8.7–10.2)
GFR calc Af Amer: 98 mL/min/{1.73_m2} (ref >59)
GFR calc non Af Amer: 85 mL/min/{1.73_m2} (ref >59)
GLOBULIN, TOTAL: 2.5 g/dL (ref 1.5–4.5)
GLUCOSE: 90 mg/dL (ref 65–99)
Potassium: 4.1 mmol/L (ref 3.5–5.2)
SODIUM: 139 mmol/L (ref 134–144)
Total Protein: 6.5 g/dL (ref 6.0–8.5)

## 2018-02-23 LAB — HIV ANTIBODY (ROUTINE TESTING W REFLEX): HIV Screen 4th Generation wRfx: NONREACTIVE

## 2018-02-23 LAB — LIPID PANEL W/O CHOL/HDL RATIO
CHOLESTEROL TOTAL: 156 mg/dL (ref 100–199)
HDL: 57 mg/dL (ref >39)
LDL CALC: 71 mg/dL (ref 0–99)
TRIGLYCERIDES: 142 mg/dL (ref 0–149)
VLDL Cholesterol Cal: 28 mg/dL (ref 5–40)

## 2018-02-23 LAB — TSH: TSH: 0.774 u[IU]/mL (ref 0.450–4.500)

## 2018-02-25 DIAGNOSIS — K219 Gastro-esophageal reflux disease without esophagitis: Secondary | ICD-10-CM | POA: Insufficient documentation

## 2018-02-25 NOTE — Assessment & Plan Note (Signed)
Increase prevacid to 30 mg and start BID prn pepcid. Work on diet modifications, increase exercise

## 2018-02-25 NOTE — Assessment & Plan Note (Signed)
Will recheck CBC today. Stable

## 2018-02-25 NOTE — Assessment & Plan Note (Signed)
Stable on tofranil, will take over management of her urinary issues while stable. Refills sent

## 2018-02-25 NOTE — Assessment & Plan Note (Signed)
Stable on flonase and zyrtec, continue current regimen

## 2018-02-25 NOTE — Assessment & Plan Note (Signed)
Will take over management while stable. Recheck TSH today, refill synthroid and adjust as needed pending results.

## 2018-03-27 ENCOUNTER — Other Ambulatory Visit: Payer: BLUE CROSS/BLUE SHIELD

## 2018-07-04 ENCOUNTER — Encounter: Payer: Self-pay | Admitting: Family Medicine

## 2018-07-04 ENCOUNTER — Ambulatory Visit: Payer: BLUE CROSS/BLUE SHIELD | Admitting: Family Medicine

## 2018-07-04 VITALS — BP 114/79 | HR 97 | Temp 98.2°F | Ht 62.0 in | Wt 160.2 lb

## 2018-07-04 DIAGNOSIS — J309 Allergic rhinitis, unspecified: Secondary | ICD-10-CM

## 2018-07-04 DIAGNOSIS — J0141 Acute recurrent pansinusitis: Secondary | ICD-10-CM | POA: Diagnosis not present

## 2018-07-04 MED ORDER — AMOXICILLIN-POT CLAVULANATE 875-125 MG PO TABS: 1.0000 | ORAL_TABLET | Freq: Two times a day (BID) | ORAL | 0 refills | Status: DC

## 2018-07-04 MED ORDER — MONTELUKAST SODIUM 10 MG PO TABS: 10.0000 mg | ORAL_TABLET | Freq: Every day | ORAL | 1 refills | Status: DC

## 2018-07-04 MED ORDER — FLUCONAZOLE 150 MG PO TABS: 150.0000 mg | ORAL_TABLET | Freq: Once | ORAL | 0 refills | Status: AC

## 2018-07-04 NOTE — Progress Notes (Signed)
BP 114/79 (BP Location: Left Arm, Patient Position: Sitting, Cuff Size: Normal)   Pulse 97   Temp 98.2 F (36.8 C) (Oral)   Ht 5\' 2"  (1.575 m)   Wt 160 lb 3.2 oz (72.7 kg)   SpO2 98%   BMI 29.30 kg/m    Subjective:    Patient ID: Martha Henry, female    DOB: 01/14/1966, 53 y.o.   MRN: [id number]  HPI: Martha Henry is a 53 y.o. female  Chief Complaint  Patient presents with  . Sinusitis    Ongoing several weeks off and on. Teeth and facial pain. Recently ears started to hurt.   . Nasal Congestion  . Headache  . Otalgia   Here today for 2-3 weeks of worsening sinus pain and pressure, congestion, sore throat, headaches, fatigue and now having b/l ear pain as well. Taking tylenol sinus and regular allergy regimen without relief. Gets sinus infections frequently. Several sick contacts recently.   Relevant past medical, surgical, family and social history reviewed and updated as indicated. Interim medical history since our last visit reviewed. Allergies and medications reviewed and updated.  Review of Systems  Per HPI unless specifically indicated above     Objective:    BP 114/79 (BP Location: Left Arm, Patient Position: Sitting, Cuff Size: Normal)   Pulse 97   Temp 98.2 F (36.8 C) (Oral)   Ht 5\' 2"  (1.575 m)   Wt 160 lb 3.2 oz (72.7 kg)   SpO2 98%   BMI 29.30 kg/m   Wt Readings from Last 3 Encounters:  07/04/18 160 lb 3.2 oz (72.7 kg)  02/22/18 158 lb (71.7 kg)  11/28/17 159 lb (72.1 kg)    Physical Exam Vitals signs and nursing note reviewed.  Constitutional:      Appearance: Normal appearance.  HENT:     Head: Atraumatic.     Comments: B/l maxillary and frontal sinuses ttp    Right Ear: Tympanic membrane and external ear normal.     Left Ear: Tympanic membrane and external ear normal.     Nose: Congestion present.     Mouth/Throat:     Mouth: Mucous membranes are moist.     Pharynx: Posterior oropharyngeal erythema present.  Eyes:   Extraocular Movements: Extraocular movements intact.     Conjunctiva/sclera: Conjunctivae normal.  Neck:     Musculoskeletal: Normal range of motion and neck supple.  Cardiovascular:     Rate and Rhythm: Normal rate and regular rhythm.     Heart sounds: Normal heart sounds.  Pulmonary:     Effort: Pulmonary effort is normal.     Breath sounds: Normal breath sounds. No wheezing or rales.  Musculoskeletal: Normal range of motion.  Skin:    General: Skin is warm and dry.  Neurological:     Mental Status: She is alert and oriented to person, place, and time.  Psychiatric:        Mood and Affect: Mood normal.        Thought Content: Thought content normal.     Results for orders placed or performed in visit on 02/22/18  Microscopic Examination  Result Value Ref Range   WBC, UA 0-5 0 - 5 /hpf   RBC, UA 0-2 0 - 2 /hpf   Epithelial Cells (non renal) 0-10 0 - 10 /hpf   Bacteria, UA Few None seen/Few  HIV antibody (with reflex)  Result Value Ref Range   HIV Screen 4th Generation wRfx Non Reactive Non  Reactive  CBC with Differential/Platelet  Result Value Ref Range   WBC 8.0 3.4 - 10.8 x10E3/uL   RBC 4.65 3.77 - 5.28 x10E6/uL   Hemoglobin 12.6 11.1 - 15.9 g/dL   Hematocrit 38.6 34.0 - 46.6 %   MCV 83 79 - 97 fL   MCH 27.1 26.6 - 33.0 pg   MCHC 32.6 31.5 - 35.7 g/dL   RDW 12.3 12.3 - 15.4 %   Platelets 343 150 - 450 x10E3/uL   Neutrophils 50 Not Estab. %   Lymphs 38 Not Estab. %   Monocytes 8 Not Estab. %   Eos 2 Not Estab. %   Basos 1 Not Estab. %   Neutrophils Absolute 4.1 1.4 - 7.0 x10E3/uL   Lymphocytes Absolute 3.0 0.7 - 3.1 x10E3/uL   Monocytes Absolute 0.6 0.1 - 0.9 x10E3/uL   EOS (ABSOLUTE) 0.2 0.0 - 0.4 x10E3/uL   Basophils Absolute 0.1 0.0 - 0.2 x10E3/uL   Immature Granulocytes 1 Not Estab. %   Immature Grans (Abs) 0.0 0.0 - 0.1 x10E3/uL  Comprehensive metabolic panel  Result Value Ref Range   Glucose 90 65 - 99 mg/dL   BUN 9 6 - 24 mg/dL   Creatinine, Ser 0.81  0.57 - 1.00 mg/dL   GFR calc non Af Amer 85 >59 mL/min/1.73   GFR calc Af Amer 98 >59 mL/min/1.73   BUN/Creatinine Ratio 11 9 - 23   Sodium 139 134 - 144 mmol/L   Potassium 4.1 3.5 - 5.2 mmol/L   Chloride 103 96 - 106 mmol/L   CO2 23 20 - 29 mmol/L   Calcium 9.3 8.7 - 10.2 mg/dL   Total Protein 6.5 6.0 - 8.5 g/dL   Albumin 4.0 3.5 - 5.5 g/dL   Globulin, Total 2.5 1.5 - 4.5 g/dL   Albumin/Globulin Ratio 1.6 1.2 - 2.2   Bilirubin Total 0.2 0.0 - 1.2 mg/dL   Alkaline Phosphatase 83 39 - 117 IU/L   AST 16 0 - 40 IU/L   ALT 13 0 - 32 IU/L  Lipid Panel w/o Chol/HDL Ratio  Result Value Ref Range   Cholesterol, Total 156 100 - 199 mg/dL   Triglycerides 142 0 - 149 mg/dL   HDL 57 >39 mg/dL   VLDL Cholesterol Cal 28 5 - 40 mg/dL   LDL Calculated 71 0 - 99 mg/dL  TSH  Result Value Ref Range   TSH 0.774 0.450 - 4.500 uIU/mL  UA/M w/rflx Culture, Routine  Result Value Ref Range   Specific Gravity, UA <1.005 (L) 1.005 - 1.030   pH, UA 6.0 5.0 - 7.5   Color, UA Yellow Yellow   Appearance Ur Cloudy (A) Clear   Leukocytes, UA Negative Negative   Protein, UA Negative Negative/Trace   Glucose, UA Negative Negative   Ketones, UA Negative Negative   RBC, UA Trace (A) Negative   Bilirubin, UA Negative Negative   Urobilinogen, Ur 0.2 0.2 - 1.0 mg/dL   Nitrite, UA Negative Negative   Microscopic Examination See below:       Assessment & Plan:   Problem List Items Addressed This Visit      Respiratory   Rhinitis, allergic - Primary    Not under good control despite BID flonase and zyrtec, add singulair and continue to monitor for improvement       Other Visit Diagnoses    Acute recurrent pansinusitis       Tx with augmentin, sinus rinses, mucinex, increased allergy regimen. F/u if not improving  Relevant Medications   amoxicillin-clavulanate (AUGMENTIN) 875-125 MG tablet       Follow up plan: Return for as scheduled.

## 2018-07-08 NOTE — Assessment & Plan Note (Addendum)
Not under good control despite BID flonase and zyrtec, add singulair and continue to monitor for improvement

## 2018-07-10 ENCOUNTER — Encounter: Payer: Self-pay | Admitting: Family Medicine

## 2018-07-12 ENCOUNTER — Other Ambulatory Visit: Payer: Self-pay | Admitting: Family Medicine

## 2018-07-12 MED ORDER — PREDNISONE 10 MG PO TABS: ORAL_TABLET | ORAL | 0 refills | Status: DC

## 2018-07-21 ENCOUNTER — Other Ambulatory Visit: Payer: Self-pay | Admitting: Family Medicine

## 2018-07-21 DIAGNOSIS — N6320 Unspecified lump in the left breast, unspecified quadrant: Secondary | ICD-10-CM

## 2018-07-24 ENCOUNTER — Other Ambulatory Visit: Payer: Self-pay | Admitting: Family Medicine

## 2018-07-24 DIAGNOSIS — Z1231 Encounter for screening mammogram for malignant neoplasm of breast: Secondary | ICD-10-CM

## 2018-07-25 ENCOUNTER — Telehealth: Payer: Self-pay | Admitting: Family Medicine

## 2018-07-25 NOTE — Telephone Encounter (Signed)
I don't see that order, last I placed was a screening mammogram from her CPE (if there is something else, please disregard because the breast was evaluated last year and was benign)  Copied from [place] [phone]. Topic: General - Inquiry >> Jul 24, 2018  4:52 PM Vernona Rieger wrote: Reason for CRM:  Dub Mikes with the breast center of [facility] called and said the order that was placed by Apolonio Schneiders for left breast mass had the reason for " anniual ". She would like to know is this a new issue with her left breast? Call back at [phone]

## 2018-07-25 NOTE — Telephone Encounter (Signed)
Dub Mikes returned my call. She states that she spoke with the patient and stated that the patient is not having any breast issues. Recent mammogram stated to have regular screening for next mammogram. That was what was ordered by Apolonio Schneiders so she states she went ahead and scheduled the patient. Nothing further is needed from [place].

## 2018-07-25 NOTE — Telephone Encounter (Signed)
Called and left Dub Mikes a VM asking for her to please return my call to discuss this.

## 2018-08-12 ENCOUNTER — Other Ambulatory Visit: Payer: Self-pay | Admitting: Family Medicine

## 2018-08-25 ENCOUNTER — Other Ambulatory Visit: Payer: Self-pay

## 2018-08-25 ENCOUNTER — Ambulatory Visit: Payer: BLUE CROSS/BLUE SHIELD | Admitting: Family Medicine

## 2018-08-25 ENCOUNTER — Ambulatory Visit: Payer: Self-pay | Admitting: *Deleted

## 2018-08-25 ENCOUNTER — Encounter: Payer: Self-pay | Admitting: Family Medicine

## 2018-08-25 ENCOUNTER — Other Ambulatory Visit (HOSPITAL_COMMUNITY)
Admission: RE | Admit: 2018-08-25 | Discharge: 2018-08-25 | Disposition: A | Discharge status: Home / Self Care | Payer: BLUE CROSS/BLUE SHIELD | Source: Ambulatory Visit | Attending: Family Medicine | Admitting: Family Medicine

## 2018-08-25 VITALS — BP 128/86 | HR 96 | Temp 98.7°F

## 2018-08-25 DIAGNOSIS — R200 Anesthesia of skin: Secondary | ICD-10-CM

## 2018-08-25 DIAGNOSIS — N898 Other specified noninflammatory disorders of vagina: Secondary | ICD-10-CM

## 2018-08-25 DIAGNOSIS — Z124 Encounter for screening for malignant neoplasm of cervix: Secondary | ICD-10-CM | POA: Diagnosis not present

## 2018-08-25 DIAGNOSIS — N951 Menopausal and female climacteric states: Secondary | ICD-10-CM

## 2018-08-25 NOTE — Telephone Encounter (Signed)
Pt has already been contacted by Santiago Glad and scheduled for an appt this afternoon in clinic. Knows to go to ER if sxs return before then

## 2018-08-25 NOTE — Progress Notes (Signed)
BP 128/86   Pulse 96   Temp 98.7 F (37.1 C) (Oral)   SpO2 99%    Subjective:    Patient ID: Martha Henry, female    DOB: 08-19-1965, 53 y.o.   MRN: [id number]  HPI: Martha Henry is a 53 y.o. female  Chief Complaint  Patient presents with  . Numbness    pt states she has had numbness and tingling in her arms. States it first started about a year ago, but has gotten worse. States she had a episode yesterday, husband called EMS for eval. Patient states she was better by the time they got there   Here today for several days of worsened left arm numbness, tingling, and pain. Was mulching her yard by herself over the last few days and notes sxs worsened with that. Over a year now her arms would go to sleep when she sleeps on her sides overnight. Used to always be the left side, now also sometimes into the right side. Yesterday things got worse and came with sweating, hot flash, and feeling faint. Husband called 46 and paramedics came out and did a few tests on her and by the time they got there the episode had resolved. Of note, pt also concurrently dealing with significant hot flashes and sweats from menopause. Denies CP, SOB, syncope, visual changes, HAs.   Pt also c/o low libido, vaginal dryness and irritation, and some irritability worsening with all the menopause changes. Has been trying lubricants during intercourse but things are still painful and she states her drive is very low. No urinary sxs, rashes, lesions, discharge, abdominal pain.   Relevant past medical, surgical, family and social history reviewed and updated as indicated. Interim medical history since our last visit reviewed. Allergies and medications reviewed and updated.  Review of Systems  Per HPI unless specifically indicated above     Objective:    BP 128/86   Pulse 96   Temp 98.7 F (37.1 C) (Oral)   SpO2 99%   Wt Readings from Last 3 Encounters:  07/04/18 160 lb 3.2 oz (72.7 kg)  02/22/18 158 lb  (71.7 kg)  11/28/17 159 lb (72.1 kg)    Physical Exam Vitals signs and nursing note reviewed.  Constitutional:      Appearance: Normal appearance. She is not ill-appearing.  HENT:     Head: Atraumatic.     Mouth/Throat:     Mouth: Mucous membranes are moist.  Eyes:     Extraocular Movements: Extraocular movements intact.     Conjunctiva/sclera: Conjunctivae normal.  Neck:     Musculoskeletal: Normal range of motion and neck supple.  Cardiovascular:     Rate and Rhythm: Normal rate and regular rhythm.     Heart sounds: Normal heart sounds.  Pulmonary:     Effort: Pulmonary effort is normal.     Breath sounds: Normal breath sounds.  Genitourinary:    Comments: Vaginal mucosa mildly erythematous, no abrasions or rashes noted. No discharge present Musculoskeletal: Normal range of motion.  Skin:    General: Skin is warm and dry.  Neurological:     General: No focal deficit present.     Mental Status: She is alert and oriented to person, place, and time.     Cranial Nerves: No cranial nerve deficit.     Motor: No weakness.     Gait: Gait normal.  Psychiatric:        Mood and Affect: Mood normal.  Thought Content: Thought content normal.        Judgment: Judgment normal.    EKG: NSR at 97 bpm, no acute ST or T wave changes  Results for orders placed or performed in visit on 02/22/18  Microscopic Examination  Result Value Ref Range   WBC, UA 0-5 0 - 5 /hpf   RBC, UA 0-2 0 - 2 /hpf   Epithelial Cells (non renal) 0-10 0 - 10 /hpf   Bacteria, UA Few None seen/Few  HIV antibody (with reflex)  Result Value Ref Range   HIV Screen 4th Generation wRfx Non Reactive Non Reactive  CBC with Differential/Platelet  Result Value Ref Range   WBC 8.0 3.4 - 10.8 x10E3/uL   RBC 4.65 3.77 - 5.28 x10E6/uL   Hemoglobin 12.6 11.1 - 15.9 g/dL   Hematocrit 38.6 34.0 - 46.6 %   MCV 83 79 - 97 fL   MCH 27.1 26.6 - 33.0 pg   MCHC 32.6 31.5 - 35.7 g/dL   RDW 12.3 12.3 - 15.4 %    Platelets 343 150 - 450 x10E3/uL   Neutrophils 50 Not Estab. %   Lymphs 38 Not Estab. %   Monocytes 8 Not Estab. %   Eos 2 Not Estab. %   Basos 1 Not Estab. %   Neutrophils Absolute 4.1 1.4 - 7.0 x10E3/uL   Lymphocytes Absolute 3.0 0.7 - 3.1 x10E3/uL   Monocytes Absolute 0.6 0.1 - 0.9 x10E3/uL   EOS (ABSOLUTE) 0.2 0.0 - 0.4 x10E3/uL   Basophils Absolute 0.1 0.0 - 0.2 x10E3/uL   Immature Granulocytes 1 Not Estab. %   Immature Grans (Abs) 0.0 0.0 - 0.1 x10E3/uL  Comprehensive metabolic panel  Result Value Ref Range   Glucose 90 65 - 99 mg/dL   BUN 9 6 - 24 mg/dL   Creatinine, Ser 0.81 0.57 - 1.00 mg/dL   GFR calc non Af Amer 85 >59 mL/min/1.73   GFR calc Af Amer 98 >59 mL/min/1.73   BUN/Creatinine Ratio 11 9 - 23   Sodium 139 134 - 144 mmol/L   Potassium 4.1 3.5 - 5.2 mmol/L   Chloride 103 96 - 106 mmol/L   CO2 23 20 - 29 mmol/L   Calcium 9.3 8.7 - 10.2 mg/dL   Total Protein 6.5 6.0 - 8.5 g/dL   Albumin 4.0 3.5 - 5.5 g/dL   Globulin, Total 2.5 1.5 - 4.5 g/dL   Albumin/Globulin Ratio 1.6 1.2 - 2.2   Bilirubin Total 0.2 0.0 - 1.2 mg/dL   Alkaline Phosphatase 83 39 - 117 IU/L   AST 16 0 - 40 IU/L   ALT 13 0 - 32 IU/L  Lipid Panel w/o Chol/HDL Ratio  Result Value Ref Range   Cholesterol, Total 156 100 - 199 mg/dL   Triglycerides 142 0 - 149 mg/dL   HDL 57 >39 mg/dL   VLDL Cholesterol Cal 28 5 - 40 mg/dL   LDL Calculated 71 0 - 99 mg/dL  TSH  Result Value Ref Range   TSH 0.774 0.450 - 4.500 uIU/mL  UA/M w/rflx Culture, Routine  Result Value Ref Range   Specific Gravity, UA <1.005 (L) 1.005 - 1.030   pH, UA 6.0 5.0 - 7.5   Color, UA Yellow Yellow   Appearance Ur Cloudy (A) Clear   Leukocytes, UA Negative Negative   Protein, UA Negative Negative/Trace   Glucose, UA Negative Negative   Ketones, UA Negative Negative   RBC, UA Trace (A) Negative   Bilirubin, UA Negative  Negative   Urobilinogen, Ur 0.2 0.2 - 1.0 mg/dL   Nitrite, UA Negative Negative   Microscopic  Examination See below:       Assessment & Plan:   Problem List Items Addressed This Visit      Other   Hot flashes, menopausal    Not a good candidate for HRT given fhx of breast cancer, discussed possibly trying some prn estrace vaginal cream for her dryness and irritation locally. Reviewed that the risks with hx of breast cancer as still present but much lower than with oral HRT due to absorption rates. She will consider this and let me know if deciding to move forward. Recommended SSRIs, gabapentin and pt declines these for now but will consider.       Left arm numbness - Primary    Suspect her sxs are more radicular than cardiac, will obtain cervical spine x-ray and work on posture with core strength exercises. Tylenol prn, massage, heat. EKG today without any acute or emergent changes, but discussed the limitations of this with r/o for MI. Pt aware to go to ER if sxs worsening or not improving.       Relevant Orders   EKG 12-Lead (Completed)   DG Cervical Spine Complete    Other Visit Diagnoses    Screening for cervical cancer       Relevant Orders   Cytology - PAP   Vaginal dryness           Follow up plan: Return in about 6 months (around 02/24/2019) for CPE.

## 2018-08-25 NOTE — Patient Instructions (Addendum)
[facility]  [address] [place], [place] [address] [phone]      Estradiol vaginal cream What is this medicine? ESTRADIOL (es tra DYE ole) contains the female hormone estrogen. It is used for symptoms of menopause, like vaginal dryness and irritation. This medicine may be used for other purposes; ask your health care provider or pharmacist if you have questions. COMMON BRAND NAME(S): Estrace What should I tell my health care provider before I take this medicine? They need to know if you have any of these conditions: -abnormal vaginal bleeding -blood vessel disease or blood clots -breast, cervical, endometrial, ovarian, liver, or uterine cancer -dementia -diabetes -gallbladder disease -heart disease or recent heart attack -high blood pressure -high cholesterol -high levels of calcium in the blood -hysterectomy -kidney disease -liver disease -migraine headaches -protein C deficiency -protein S deficiency -stroke -systemic lupus erythematosus (SLE) -tobacco smoker -an unusual or allergic reaction to estrogens, other hormones, soy, other medicines, foods, dyes, or preservatives -pregnant or trying to get pregnant -breast-feeding How should I use this medicine? This medicine is for use in the vagina only. Do not take by mouth. Follow the directions on the prescription label. Read package directions carefully before using. Use the special applicator supplied with the cream. Wash hands before and after use. Fill the applicator with the prescribed amount of cream. Lie on your back, part and bend your knees. Insert the applicator into the vagina and push the plunger to expel the cream into the vagina. Wash the applicator with warm soapy water and rinse well. Use exactly as directed for the complete length of time prescribed. Do not stop using except on the advice of your doctor or health care professional. A patient package insert for the  product will be given with each prescription and refill. Read this sheet carefully each time. The sheet may change frequently. Talk to your pediatrician regarding the use of this medicine in children. This medicine is not approved for use in children. Overdosage: If you think you have taken too much of this medicine contact a poison control center or emergency room at once. NOTE: This medicine is only for you. Do not share this medicine with others. What if I miss a dose? If you miss a dose, use it as soon as you can. If it is almost time for your next dose, use only that dose. Do not use double or extra doses. What may interact with this medicine? Do not take this medicine with any of the following medications: -aromatase inhibitors like aminoglutethimide, anastrozole, exemestane, letrozole, testolactone This medicine may also interact with the following medications: -barbiturates used for inducing sleep or treating seizures -carbamazepine -grapefruit juice -medicines for fungal infections like ketoconazole and itraconazole -raloxifene -rifabutin -rifampin -rifapentine -ritonavir -some antibiotics used to treat infections -St. John's Wort -tamoxifen -warfarin This list may not describe all possible interactions. Give your health care provider a list of all the medicines, herbs, non-prescription drugs, or dietary supplements you use. Also tell them if you smoke, drink alcohol, or use illegal drugs. Some items may interact with your medicine. What should I watch for while using this medicine? Visit your health care professional for regular checks on your progress. You will need a regular breast and pelvic exam. You should also discuss the need for regular mammograms with your health care professional, and follow his or her guidelines. This medicine can make your body retain fluid, making your fingers, hands, or ankles swell. Your blood pressure  can go up. Contact your doctor or health care  professional if you feel you are retaining fluid. If you have any reason to think you are pregnant, stop taking this medicine at once and contact your doctor or health care professional. Tobacco smoking increases the risk of getting a blood clot or having a stroke, especially if you are more than 51 years old. You are strongly advised not to smoke. If you wear contact lenses and notice visual changes, or if the lenses begin to feel uncomfortable, consult your eye care specialist. If you are going to have elective surgery, you may need to stop taking this medicine beforehand. Consult your health care professional for advice prior to scheduling the surgery. What side effects may I notice from receiving this medicine? Side effects that you should report to your doctor or health care professional as soon as possible: -allergic reactions like skin rash, itching or hives, swelling of the face, lips, or tongue -breast tissue changes or discharge -changes in vision -chest pain -confusion, trouble speaking or understanding -dark urine -general ill feeling or flu-like symptoms -light-colored stools -nausea, vomiting -pain, swelling, warmth in the leg -right upper belly pain -severe headaches -shortness of breath -sudden numbness or weakness of the face, arm or leg -trouble walking, dizziness, loss of balance or coordination -unusual vaginal bleeding -yellowing of the eyes or skin Side effects that usually do not require medical attention (report to your doctor or health care professional if they continue or are bothersome): -hair loss -increased hunger or thirst -increased urination -symptoms of vaginal infection like itching, irritation or unusual discharge -unusually weak or tired This list may not describe all possible side effects. Call your doctor for medical advice about side effects. You may report side effects to FDA at 1-800-FDA-1088. Where should I keep my medicine? Keep out of the  reach of children. Store at room temperature between 15 and 30 degrees C (59 and 86 degrees F). Protect from temperatures above 40 degrees C (104 degrees C). Do not freeze. Throw away any unused medicine after the expiration date. NOTE: This sheet is a summary. It may not cover all possible information. If you have questions about this medicine, talk to your doctor, pharmacist, or health care provider.  2019 Elsevier/Gold Standard (2010-07-22 09:18:12)

## 2018-08-25 NOTE — Telephone Encounter (Addendum)
For about a year now , the patient has had tingling and numbness in the right arm.  This morning it got worst and even went to the left arm. She felt like she was going to faint. Denied dizziness, nausea, vomiting.  She did get really hot and started sweating. She laid down and it all went away.   Husband called the paramedics. By the time they got there it was over. They assessed her, vital signs and alibility to use her hands. She felt fine then and no problem using her hands. The pain that she felt in the right arm was about a #2 or 3.  No history of heart disease. Hx of pulse of about 100 or 110. No history of heart disease in her family.  Does not take any blood thinners. No shortness of breath. No nausea, dizziness  She thinks possibly a pinched nerved. Sleeping has been awful. Her arms get numb when trying to sleep on one side or the other.  She is requesting an appointment for an EKG or other cardiac related tests. Advised pt of having a virtual visit only at the office. Also advised that she should go to the hospital if this happens again. Pt voiced understanding. Attempted to reach flow at [facility] twice. Skyped flow regarding pt's symptoms.  Routing to [facility].

## 2018-08-29 DIAGNOSIS — R200 Anesthesia of skin: Secondary | ICD-10-CM | POA: Insufficient documentation

## 2018-08-29 DIAGNOSIS — N951 Menopausal and female climacteric states: Secondary | ICD-10-CM | POA: Insufficient documentation

## 2018-08-29 LAB — CYTOLOGY - PAP
Diagnosis: NEGATIVE
HPV: NOT DETECTED

## 2018-08-29 NOTE — Assessment & Plan Note (Signed)
Not a good candidate for HRT given fhx of breast cancer, discussed possibly trying some prn estrace vaginal cream for her dryness and irritation locally. Reviewed that the risks with hx of breast cancer as still present but much lower than with oral HRT due to absorption rates. She will consider this and let me know if deciding to move forward. Recommended SSRIs, gabapentin and pt declines these for now but will consider.

## 2018-08-29 NOTE — Assessment & Plan Note (Signed)
Suspect her sxs are more radicular than cardiac, will obtain cervical spine x-ray and work on posture with core strength exercises. Tylenol prn, massage, heat. EKG today without any acute or emergent changes, but discussed the limitations of this with r/o for MI. Pt aware to go to ER if sxs worsening or not improving.

## 2018-09-05 ENCOUNTER — Ambulatory Visit: Payer: BLUE CROSS/BLUE SHIELD

## 2018-09-05 ENCOUNTER — Other Ambulatory Visit: Payer: BLUE CROSS/BLUE SHIELD

## 2018-09-05 DIAGNOSIS — M9901 Segmental and somatic dysfunction of cervical region: Secondary | ICD-10-CM | POA: Diagnosis not present

## 2018-09-05 DIAGNOSIS — M9902 Segmental and somatic dysfunction of thoracic region: Secondary | ICD-10-CM | POA: Diagnosis not present

## 2018-09-05 DIAGNOSIS — M5414 Radiculopathy, thoracic region: Secondary | ICD-10-CM | POA: Diagnosis not present

## 2018-09-05 DIAGNOSIS — R51 Headache: Secondary | ICD-10-CM | POA: Diagnosis not present

## 2018-09-06 DIAGNOSIS — M5414 Radiculopathy, thoracic region: Secondary | ICD-10-CM | POA: Diagnosis not present

## 2018-09-06 DIAGNOSIS — R51 Headache: Secondary | ICD-10-CM | POA: Diagnosis not present

## 2018-09-06 DIAGNOSIS — M9901 Segmental and somatic dysfunction of cervical region: Secondary | ICD-10-CM | POA: Diagnosis not present

## 2018-09-06 DIAGNOSIS — M9902 Segmental and somatic dysfunction of thoracic region: Secondary | ICD-10-CM | POA: Diagnosis not present

## 2018-09-13 DIAGNOSIS — R51 Headache: Secondary | ICD-10-CM | POA: Diagnosis not present

## 2018-09-13 DIAGNOSIS — M9901 Segmental and somatic dysfunction of cervical region: Secondary | ICD-10-CM | POA: Diagnosis not present

## 2018-09-13 DIAGNOSIS — M5414 Radiculopathy, thoracic region: Secondary | ICD-10-CM | POA: Diagnosis not present

## 2018-09-13 DIAGNOSIS — M9902 Segmental and somatic dysfunction of thoracic region: Secondary | ICD-10-CM | POA: Diagnosis not present

## 2018-09-15 DIAGNOSIS — M5414 Radiculopathy, thoracic region: Secondary | ICD-10-CM | POA: Diagnosis not present

## 2018-09-15 DIAGNOSIS — R51 Headache: Secondary | ICD-10-CM | POA: Diagnosis not present

## 2018-09-15 DIAGNOSIS — M9902 Segmental and somatic dysfunction of thoracic region: Secondary | ICD-10-CM | POA: Diagnosis not present

## 2018-09-15 DIAGNOSIS — M9901 Segmental and somatic dysfunction of cervical region: Secondary | ICD-10-CM | POA: Diagnosis not present

## 2018-09-18 DIAGNOSIS — R51 Headache: Secondary | ICD-10-CM | POA: Diagnosis not present

## 2018-09-18 DIAGNOSIS — M5414 Radiculopathy, thoracic region: Secondary | ICD-10-CM | POA: Diagnosis not present

## 2018-09-18 DIAGNOSIS — M9901 Segmental and somatic dysfunction of cervical region: Secondary | ICD-10-CM | POA: Diagnosis not present

## 2018-09-18 DIAGNOSIS — M9902 Segmental and somatic dysfunction of thoracic region: Secondary | ICD-10-CM | POA: Diagnosis not present

## 2018-09-20 DIAGNOSIS — M9901 Segmental and somatic dysfunction of cervical region: Secondary | ICD-10-CM | POA: Diagnosis not present

## 2018-09-20 DIAGNOSIS — M9902 Segmental and somatic dysfunction of thoracic region: Secondary | ICD-10-CM | POA: Diagnosis not present

## 2018-09-20 DIAGNOSIS — M5414 Radiculopathy, thoracic region: Secondary | ICD-10-CM | POA: Diagnosis not present

## 2018-09-20 DIAGNOSIS — R51 Headache: Secondary | ICD-10-CM | POA: Diagnosis not present

## 2018-09-26 DIAGNOSIS — M9901 Segmental and somatic dysfunction of cervical region: Secondary | ICD-10-CM | POA: Diagnosis not present

## 2018-09-26 DIAGNOSIS — R51 Headache: Secondary | ICD-10-CM | POA: Diagnosis not present

## 2018-09-26 DIAGNOSIS — M5414 Radiculopathy, thoracic region: Secondary | ICD-10-CM | POA: Diagnosis not present

## 2018-09-26 DIAGNOSIS — M9902 Segmental and somatic dysfunction of thoracic region: Secondary | ICD-10-CM | POA: Diagnosis not present

## 2018-09-29 DIAGNOSIS — M5414 Radiculopathy, thoracic region: Secondary | ICD-10-CM | POA: Diagnosis not present

## 2018-09-29 DIAGNOSIS — M9902 Segmental and somatic dysfunction of thoracic region: Secondary | ICD-10-CM | POA: Diagnosis not present

## 2018-09-29 DIAGNOSIS — M9901 Segmental and somatic dysfunction of cervical region: Secondary | ICD-10-CM | POA: Diagnosis not present

## 2018-09-29 DIAGNOSIS — R51 Headache: Secondary | ICD-10-CM | POA: Diagnosis not present

## 2018-10-04 DIAGNOSIS — M9902 Segmental and somatic dysfunction of thoracic region: Secondary | ICD-10-CM | POA: Diagnosis not present

## 2018-10-04 DIAGNOSIS — R51 Headache: Secondary | ICD-10-CM | POA: Diagnosis not present

## 2018-10-04 DIAGNOSIS — M5414 Radiculopathy, thoracic region: Secondary | ICD-10-CM | POA: Diagnosis not present

## 2018-10-04 DIAGNOSIS — M9901 Segmental and somatic dysfunction of cervical region: Secondary | ICD-10-CM | POA: Diagnosis not present

## 2018-10-06 DIAGNOSIS — M9901 Segmental and somatic dysfunction of cervical region: Secondary | ICD-10-CM | POA: Diagnosis not present

## 2018-10-06 DIAGNOSIS — M9902 Segmental and somatic dysfunction of thoracic region: Secondary | ICD-10-CM | POA: Diagnosis not present

## 2018-10-06 DIAGNOSIS — R51 Headache: Secondary | ICD-10-CM | POA: Diagnosis not present

## 2018-10-06 DIAGNOSIS — M5414 Radiculopathy, thoracic region: Secondary | ICD-10-CM | POA: Diagnosis not present

## 2018-10-09 DIAGNOSIS — R51 Headache: Secondary | ICD-10-CM | POA: Diagnosis not present

## 2018-10-09 DIAGNOSIS — M9901 Segmental and somatic dysfunction of cervical region: Secondary | ICD-10-CM | POA: Diagnosis not present

## 2018-10-09 DIAGNOSIS — M5414 Radiculopathy, thoracic region: Secondary | ICD-10-CM | POA: Diagnosis not present

## 2018-10-09 DIAGNOSIS — M9902 Segmental and somatic dysfunction of thoracic region: Secondary | ICD-10-CM | POA: Diagnosis not present

## 2018-10-11 DIAGNOSIS — R51 Headache: Secondary | ICD-10-CM | POA: Diagnosis not present

## 2018-10-11 DIAGNOSIS — M5414 Radiculopathy, thoracic region: Secondary | ICD-10-CM | POA: Diagnosis not present

## 2018-10-11 DIAGNOSIS — M9901 Segmental and somatic dysfunction of cervical region: Secondary | ICD-10-CM | POA: Diagnosis not present

## 2018-10-11 DIAGNOSIS — M9902 Segmental and somatic dysfunction of thoracic region: Secondary | ICD-10-CM | POA: Diagnosis not present

## 2018-10-18 DIAGNOSIS — M9901 Segmental and somatic dysfunction of cervical region: Secondary | ICD-10-CM | POA: Diagnosis not present

## 2018-10-18 DIAGNOSIS — R51 Headache: Secondary | ICD-10-CM | POA: Diagnosis not present

## 2018-10-18 DIAGNOSIS — M5414 Radiculopathy, thoracic region: Secondary | ICD-10-CM | POA: Diagnosis not present

## 2018-10-18 DIAGNOSIS — M9902 Segmental and somatic dysfunction of thoracic region: Secondary | ICD-10-CM | POA: Diagnosis not present

## 2018-10-23 ENCOUNTER — Ambulatory Visit
Admission: RE | Admit: 2018-10-23 | Discharge: 2018-10-23 | Disposition: A | Discharge status: Home / Self Care | Payer: BC Managed Care – PPO | Source: Ambulatory Visit | Attending: Family Medicine | Admitting: Family Medicine

## 2018-10-23 ENCOUNTER — Other Ambulatory Visit: Payer: Self-pay

## 2018-10-23 DIAGNOSIS — Z1231 Encounter for screening mammogram for malignant neoplasm of breast: Secondary | ICD-10-CM

## 2018-11-08 DIAGNOSIS — M5414 Radiculopathy, thoracic region: Secondary | ICD-10-CM | POA: Diagnosis not present

## 2018-11-08 DIAGNOSIS — M9901 Segmental and somatic dysfunction of cervical region: Secondary | ICD-10-CM | POA: Diagnosis not present

## 2018-11-08 DIAGNOSIS — R51 Headache: Secondary | ICD-10-CM | POA: Diagnosis not present

## 2018-11-08 DIAGNOSIS — M9902 Segmental and somatic dysfunction of thoracic region: Secondary | ICD-10-CM | POA: Diagnosis not present

## 2018-11-14 ENCOUNTER — Encounter: Payer: Self-pay | Admitting: Family Medicine

## 2018-11-14 NOTE — Telephone Encounter (Signed)
Family history updated to reflect new information.

## 2018-11-21 DIAGNOSIS — R51 Headache: Secondary | ICD-10-CM | POA: Diagnosis not present

## 2018-11-21 DIAGNOSIS — M9902 Segmental and somatic dysfunction of thoracic region: Secondary | ICD-10-CM | POA: Diagnosis not present

## 2018-11-21 DIAGNOSIS — M9901 Segmental and somatic dysfunction of cervical region: Secondary | ICD-10-CM | POA: Diagnosis not present

## 2018-11-21 DIAGNOSIS — M5414 Radiculopathy, thoracic region: Secondary | ICD-10-CM | POA: Diagnosis not present

## 2018-12-01 ENCOUNTER — Other Ambulatory Visit: Payer: Self-pay | Admitting: Family Medicine

## 2018-12-01 NOTE — Telephone Encounter (Signed)
Requested Prescriptions  Pending Prescriptions Disp Refills  . montelukast (SINGULAIR) 10 MG tablet [Pharmacy Med Name: Montelukast Sodium 10 MG Oral Tablet] 90 tablet 0    Sig: TAKE 1 TABLET BY MOUTH AT BEDTIME     Pulmonology:  Leukotriene Inhibitors Passed - 12/01/2018 10:20 AM      Passed - Valid encounter within last 12 months    Recent Outpatient Visits          3 months ago Left arm numbness   [place], [place]   5 months ago Allergic rhinitis, unspecified seasonality, unspecified trigger   [place], [place], [place]   9 months ago Hypothyroidism, unspecified type   [facility], [place], [place]   1 year ago Upper respiratory tract infection, unspecified type   [facility], [place], [place]   1 year ago Otitis media with effusion, right   [facility], [place], [place]      Future Appointments            In 2 months [facility], [place], [place], [place]

## 2018-12-05 ENCOUNTER — Encounter: Payer: Self-pay | Admitting: General Surgery

## 2018-12-25 DIAGNOSIS — M9902 Segmental and somatic dysfunction of thoracic region: Secondary | ICD-10-CM | POA: Diagnosis not present

## 2018-12-25 DIAGNOSIS — M5414 Radiculopathy, thoracic region: Secondary | ICD-10-CM | POA: Diagnosis not present

## 2018-12-25 DIAGNOSIS — R51 Headache: Secondary | ICD-10-CM | POA: Diagnosis not present

## 2018-12-25 DIAGNOSIS — M9901 Segmental and somatic dysfunction of cervical region: Secondary | ICD-10-CM | POA: Diagnosis not present

## 2019-01-09 ENCOUNTER — Other Ambulatory Visit: Payer: Self-pay | Admitting: Family Medicine

## 2019-01-11 MED ORDER — MONTELUKAST SODIUM 10 MG PO TABS: 10.0000 mg | ORAL_TABLET | Freq: Every day | ORAL | 1 refills | Status: DC

## 2019-01-12 DIAGNOSIS — M5414 Radiculopathy, thoracic region: Secondary | ICD-10-CM | POA: Diagnosis not present

## 2019-01-12 DIAGNOSIS — M9901 Segmental and somatic dysfunction of cervical region: Secondary | ICD-10-CM | POA: Diagnosis not present

## 2019-01-12 DIAGNOSIS — M9902 Segmental and somatic dysfunction of thoracic region: Secondary | ICD-10-CM | POA: Diagnosis not present

## 2019-01-12 DIAGNOSIS — R51 Headache: Secondary | ICD-10-CM | POA: Diagnosis not present

## 2019-01-18 DIAGNOSIS — Z8041 Family history of malignant neoplasm of ovary: Secondary | ICD-10-CM | POA: Diagnosis not present

## 2019-01-18 DIAGNOSIS — Z803 Family history of malignant neoplasm of breast: Secondary | ICD-10-CM | POA: Diagnosis not present

## 2019-01-18 DIAGNOSIS — N951 Menopausal and female climacteric states: Secondary | ICD-10-CM | POA: Diagnosis not present

## 2019-01-26 DIAGNOSIS — M5414 Radiculopathy, thoracic region: Secondary | ICD-10-CM | POA: Diagnosis not present

## 2019-01-26 DIAGNOSIS — M9901 Segmental and somatic dysfunction of cervical region: Secondary | ICD-10-CM | POA: Diagnosis not present

## 2019-01-26 DIAGNOSIS — M9902 Segmental and somatic dysfunction of thoracic region: Secondary | ICD-10-CM | POA: Diagnosis not present

## 2019-01-26 DIAGNOSIS — R51 Headache: Secondary | ICD-10-CM | POA: Diagnosis not present

## 2019-02-16 DIAGNOSIS — M9901 Segmental and somatic dysfunction of cervical region: Secondary | ICD-10-CM | POA: Diagnosis not present

## 2019-02-16 DIAGNOSIS — M9902 Segmental and somatic dysfunction of thoracic region: Secondary | ICD-10-CM | POA: Diagnosis not present

## 2019-02-16 DIAGNOSIS — R519 Headache, unspecified: Secondary | ICD-10-CM | POA: Diagnosis not present

## 2019-02-16 DIAGNOSIS — M5414 Radiculopathy, thoracic region: Secondary | ICD-10-CM | POA: Diagnosis not present

## 2019-02-27 ENCOUNTER — Encounter: Payer: BLUE CROSS/BLUE SHIELD | Admitting: Family Medicine

## 2019-03-05 ENCOUNTER — Other Ambulatory Visit: Payer: Self-pay

## 2019-03-05 ENCOUNTER — Encounter: Payer: Self-pay | Admitting: Family Medicine

## 2019-03-05 ENCOUNTER — Ambulatory Visit (INDEPENDENT_AMBULATORY_CARE_PROVIDER_SITE_OTHER): Payer: BC Managed Care – PPO | Admitting: Family Medicine

## 2019-03-05 VITALS — BP 137/88 | HR 106 | Temp 97.8°F | Ht 61.7 in | Wt 158.2 lb

## 2019-03-05 DIAGNOSIS — R350 Frequency of micturition: Secondary | ICD-10-CM | POA: Diagnosis not present

## 2019-03-05 DIAGNOSIS — R8271 Bacteriuria: Secondary | ICD-10-CM | POA: Diagnosis not present

## 2019-03-05 DIAGNOSIS — Z862 Personal history of diseases of the blood and blood-forming organs and certain disorders involving the immune mechanism: Secondary | ICD-10-CM

## 2019-03-05 DIAGNOSIS — J309 Allergic rhinitis, unspecified: Secondary | ICD-10-CM

## 2019-03-05 DIAGNOSIS — K219 Gastro-esophageal reflux disease without esophagitis: Secondary | ICD-10-CM

## 2019-03-05 DIAGNOSIS — N898 Other specified noninflammatory disorders of vagina: Secondary | ICD-10-CM | POA: Diagnosis not present

## 2019-03-05 DIAGNOSIS — E039 Hypothyroidism, unspecified: Secondary | ICD-10-CM

## 2019-03-05 DIAGNOSIS — N393 Stress incontinence (female) (male): Secondary | ICD-10-CM | POA: Diagnosis not present

## 2019-03-05 DIAGNOSIS — Z0000 Encounter for general adult medical examination without abnormal findings: Secondary | ICD-10-CM | POA: Diagnosis not present

## 2019-03-05 DIAGNOSIS — N951 Menopausal and female climacteric states: Secondary | ICD-10-CM

## 2019-03-05 LAB — WET PREP FOR TRICH, YEAST, CLUE
Clue Cell Exam: NEGATIVE
Trichomonas Exam: NEGATIVE
Yeast Exam: NEGATIVE

## 2019-03-05 MED ORDER — ESTRADIOL 0.1 MG/GM VA CREA: 1.0000 | TOPICAL_CREAM | Freq: Every evening | VAGINAL | 0 refills | Status: DC | PRN

## 2019-03-05 NOTE — Progress Notes (Signed)
BP 137/88   Pulse (!) 106   Temp 97.8 F (36.6 C) (Oral)   Ht 5' 1.7" (1.567 m)   Wt 158 lb 3.2 oz (71.8 kg)   SpO2 98%   BMI 29.22 kg/m    Subjective:    Patient ID: Martha Henry, female    DOB: 1966-04-21, 53 y.o.   MRN: [id number]  HPI: Martha Henry is a 53 y.o. female presenting on 03/05/2019 for comprehensive medical examination. Current medical complaints include:see below  1 month of pressure in her lower abdomen and urinating 3-4 times at bedtime. Has been on tofranil for years, having to wear pads the last few months. Can typically make it to the bathroom without urinary leaking unless coughing or sneezing, etc. Previously followed by Urology but provider retired, has not reestablished since.   Hypothyroidism - stable on synthroid, asymptomatic  Allergic rhinitis - noting significant benefit since adding singulair to regimen of zyrtec and flonase. Has not had a flare up in quite some time.   Prevacid doing well for her GERD. No breakthrough sxs, N/V, melena.   Hot flashes - noting significant hot flashes, night sweats, libido issues, vaginal dryness. Has discussed with GYN and opted to not start therapy at this time, and has reviewed options several months ago for safe non-hormonal therapies here with PCP. Nervous about new medications. Does have a fhx of GYN cancers so she is wary to start any HRT understandably.   She currently lives with: Menopausal Symptoms: yes  Depression Screen done today and results listed below:  Depression screen [facility] 2/9 03/05/2019 02/22/2018 11/21/2017  Decreased Interest 0 0 0  Down, Depressed, Hopeless 0 0 0  PHQ - 2 Score 0 0 0  Altered sleeping - 0 1  Tired, decreased energy - 0 1  Change in appetite - 0 1  Feeling bad or failure about yourself  - - 0  Trouble concentrating - 0 1  Moving slowly or fidgety/restless - 0 0  Suicidal thoughts - 0 0  PHQ-9 Score - 0 4  Difficult doing work/chores - - Not difficult at all    The patient does not have a history of falls. I did complete a risk assessment for falls. A plan of care for falls was documented.   Past Medical History:  Past Medical History:  Diagnosis Date  . GERD (gastroesophageal reflux disease)   . Headache    sinus  . History of abnormal mammogram    seen by Dr. Jamal Collin  . Hypothyroidism   . ITP (idiopathic thrombocytopenic purpura) 1990   no issues in several years  . Tubular adenoma of colon 2016  . Wears contact lenses     Surgical History:  Past Surgical History:  Procedure Laterality Date  . ABDOMINAL HYSTERECTOMY  2003   partial/only ovaries remain  . APPENDECTOMY  2009  . CHOLECYSTECTOMY  2009  . COLONOSCOPY WITH PROPOFOL N/A 02/26/2015   Procedure: COLONOSCOPY WITH PROPOFOL;  Surgeon: Christene Lye, MD;  Location: [facility] ENDOSCOPY;  Service: Endoscopy;  Laterality: N/A;  . NASAL TURBINATE REDUCTION Bilateral 05/02/2015   Procedure: TURBINATE REDUCTION/SUBMUCOSAL RESECTION;  Surgeon: Beverly Gust, MD;  Location: [place];  Service: ENT;  Laterality: Bilateral;  . SEPTOPLASTY N/A 05/02/2015   Procedure: SEPTOPLASTY;  Surgeon: Beverly Gust, MD;  Location: [place];  Service: ENT;  Laterality: N/A;  . TONSILLECTOMY  1975    Medications:  Current Outpatient Medications on File Prior to Visit  Medication  Sig  . Calcium-Vitamin D-Vitamin K (VIACTIV) [id number] MG-UNT-MCG CHEW Chew by mouth.  . cetirizine (ZYRTEC) 10 MG tablet Take 10 mg by mouth daily.  . Cyanocobalamin (VITAMIN B-12 PO) Take by mouth daily.  . Fiber Adult Gummies 2 g CHEW Chew by mouth.  Marland Kitchen imipramine (TOFRANIL) 25 MG tablet Take 1 tablet (25 mg total) by mouth daily.  . montelukast (SINGULAIR) 10 MG tablet Take 1 tablet (10 mg total) by mouth at bedtime.  . Multiple Vitamins-Minerals (MULTIVITAMIN WITH MINERALS) tablet Take 1 tablet by mouth daily.  . sennosides-docusate sodium (SENOKOT-S) 8.6-50 MG tablet Take 1 tablet by  mouth daily.  . Turmeric Curcumin 500 MG CAPS Take by mouth.   No current facility-administered medications on file prior to visit.     Allergies:  Allergies  Allergen Reactions  . Erythromycin Other (See Comments)    Chest pain  . Aspirin Other (See Comments)    Platelet problems  . Ciprofloxacin Other (See Comments)    constipation    Social History:  Social History   Socioeconomic History  . Marital status: Married    Spouse name: Not on file  . Number of children: Not on file  . Years of education: Not on file  . Highest education level: Not on file  Occupational History  . Not on file  Social Needs  . Financial resource strain: Not on file  . Food insecurity    Worry: Not on file    Inability: Not on file  . Transportation needs    Medical: Not on file    Non-medical: Not on file  Tobacco Use  . Smoking status: Never Smoker  . Smokeless tobacco: Never Used  Substance and Sexual Activity  . Alcohol use: No  . Drug use: No  . Sexual activity: Not on file  Lifestyle  . Physical activity    Days per week: Not on file    Minutes per session: Not on file  . Stress: Not on file  Relationships  . Social Herbalist on phone: Not on file    Gets together: Not on file    Attends religious service: Not on file    Active member of club or organization: Not on file    Attends meetings of clubs or organizations: Not on file    Relationship status: Not on file  . Intimate partner violence    Fear of current or ex partner: Not on file    Emotionally abused: Not on file    Physically abused: Not on file    Forced sexual activity: Not on file  Other Topics Concern  . Not on file  Social History Narrative  . Not on file   Social History   Tobacco Use  Smoking Status Never Smoker  Smokeless Tobacco Never Used   Social History   Substance and Sexual Activity  Alcohol Use No    Family History:  Family History  Problem Relation Age of Onset  .  Cancer Mother 13       breast  . Breast cancer Mother 7  . Colon cancer Father 5  . Cancer Father        colon  . Breast cancer Maternal Aunt   . Cancer Maternal Aunt        breast  . Ovarian cancer Maternal Aunt        Stage 4  . Lung cancer Maternal Grandfather   . Cervical cancer Maternal Grandmother  Past medical history, surgical history, medications, allergies, family history and social history reviewed with patient today and changes made to appropriate areas of the chart.   Review of Systems - General ROS: negative Psychological ROS: negative Ophthalmic ROS: negative ENT ROS: negative Allergy and Immunology ROS: negative Hematological and Lymphatic ROS: negative Endocrine ROS: negative Breast ROS: negative for breast lumps Respiratory ROS: no cough, shortness of breath, or wheezing Cardiovascular ROS: no chest pain or dyspnea on exertion Gastrointestinal ROS: no abdominal pain, change in bowel habits, or black or bloody stools Genito-Urinary ROS: positive for - incontinence Musculoskeletal ROS: negative Neurological ROS: no TIA or stroke symptoms Dermatological ROS: negative All other ROS negative except what is listed above and in the HPI.      Objective:    BP 137/88   Pulse (!) 106   Temp 97.8 F (36.6 C) (Oral)   Ht 5' 1.7" (1.567 m)   Wt 158 lb 3.2 oz (71.8 kg)   SpO2 98%   BMI 29.22 kg/m   Wt Readings from Last 3 Encounters:  03/05/19 158 lb 3.2 oz (71.8 kg)  07/04/18 160 lb 3.2 oz (72.7 kg)  02/22/18 158 lb (71.7 kg)    Physical Exam Vitals signs and nursing note reviewed.  Constitutional:      General: She is not in acute distress.    Appearance: She is well-developed.  HENT:     Head: Atraumatic.     Right Ear: External ear normal.     Left Ear: External ear normal.     Nose: Nose normal.     Mouth/Throat:     Pharynx: No oropharyngeal exudate.  Eyes:     General: No scleral icterus.    Conjunctiva/sclera: Conjunctivae normal.      Pupils: Pupils are equal, round, and reactive to light.  Neck:     Musculoskeletal: Normal range of motion and neck supple.     Thyroid: No thyromegaly.  Cardiovascular:     Rate and Rhythm: Normal rate and regular rhythm.     Heart sounds: Normal heart sounds.  Pulmonary:     Effort: Pulmonary effort is normal. No respiratory distress.     Breath sounds: Normal breath sounds.  Abdominal:     General: Bowel sounds are normal.     Palpations: Abdomen is soft. There is no mass.     Tenderness: There is no abdominal tenderness.  Genitourinary:    General: Normal vulva.     Vagina: No vaginal discharge.  Musculoskeletal: Normal range of motion.        General: No tenderness.  Lymphadenopathy:     Cervical: No cervical adenopathy.  Skin:    General: Skin is warm and dry.     Findings: No rash.  Neurological:     Mental Status: She is alert and oriented to person, place, and time.     Cranial Nerves: No cranial nerve deficit.  Psychiatric:        Behavior: Behavior normal.     Results for orders placed or performed in visit on 03/05/19  WET PREP FOR [place], YEAST, CLUE   Specimen: Vaginal Fluid   VAGINAL FLUI  Result Value Ref Range   Trichomonas Exam Negative Negative   Yeast Exam Negative Negative   Clue Cell Exam Negative Negative  Microscopic Examination   VAGINAL FLUI  Result Value Ref Range   WBC, UA 0-5 0 - 5 /hpf   RBC 0-2 0 - 2 /hpf   Epithelial Cells (  non renal) 0-10 0 - 10 /hpf   Bacteria, UA Moderate (A) None seen/Few  Urine Culture, Reflex   VAGINAL FLUI  Result Value Ref Range   Urine Culture, Routine Final report    Organism ID, Bacteria Comment   CBC with Differential/Platelet  Result Value Ref Range   WBC 8.4 3.4 - 10.8 x10E3/uL   RBC 4.71 3.77 - 5.28 x10E6/uL   Hemoglobin 12.7 11.1 - 15.9 g/dL   Hematocrit 39.1 34.0 - 46.6 %   MCV 83 79 - 97 fL   MCH 27.0 26.6 - 33.0 pg   MCHC 32.5 31.5 - 35.7 g/dL   RDW 12.3 11.7 - 15.4 %   Platelets 360 150 -  450 x10E3/uL   Neutrophils 55 Not Estab. %   Lymphs 34 Not Estab. %   Monocytes 8 Not Estab. %   Eos 1 Not Estab. %   Basos 1 Not Estab. %   Neutrophils Absolute 4.7 1.4 - 7.0 x10E3/uL   Lymphocytes Absolute 2.8 0.7 - 3.1 x10E3/uL   Monocytes Absolute 0.7 0.1 - 0.9 x10E3/uL   EOS (ABSOLUTE) 0.1 0.0 - 0.4 x10E3/uL   Basophils Absolute 0.1 0.0 - 0.2 x10E3/uL   Immature Granulocytes 1 Not Estab. %   Immature Grans (Abs) 0.0 0.0 - 0.1 x10E3/uL  Comprehensive metabolic panel  Result Value Ref Range   Glucose 79 65 - 99 mg/dL   BUN 10 6 - 24 mg/dL   Creatinine, Ser 0.75 0.57 - 1.00 mg/dL   GFR calc non Af Amer 93 >59 mL/min/1.73   GFR calc Af Amer 107 >59 mL/min/1.73   BUN/Creatinine Ratio 13 9 - 23   Sodium 138 134 - 144 mmol/L   Potassium 4.2 3.5 - 5.2 mmol/L   Chloride 102 96 - 106 mmol/L   CO2 23 20 - 29 mmol/L   Calcium 9.4 8.7 - 10.2 mg/dL   Total Protein 6.8 6.0 - 8.5 g/dL   Albumin 4.2 3.8 - 4.9 g/dL   Globulin, Total 2.6 1.5 - 4.5 g/dL   Albumin/Globulin Ratio 1.6 1.2 - 2.2   Bilirubin Total <0.2 0.0 - 1.2 mg/dL   Alkaline Phosphatase 96 39 - 117 IU/L   AST 20 0 - 40 IU/L   ALT 13 0 - 32 IU/L  Lipid Panel w/o Chol/HDL Ratio  Result Value Ref Range   Cholesterol, Total 165 100 - 199 mg/dL   Triglycerides 178 (H) 0 - 149 mg/dL   HDL 60 >39 mg/dL   VLDL Cholesterol Cal 30 5 - 40 mg/dL   LDL Chol Calc (NIH) 75 0 - 99 mg/dL  TSH  Result Value Ref Range   TSH 3.660 0.450 - 4.500 uIU/mL  UA/M w/rflx Culture, Routine   Specimen: Vaginal Fluid   VAGINAL FLUI  Result Value Ref Range   Specific Gravity, UA 1.015 1.005 - 1.030   pH, UA 5.5 5.0 - 7.5   Color, UA Yellow Yellow   Appearance Ur Clear Clear   Leukocytes,UA Negative Negative   Protein,UA Negative Negative/Trace   Glucose, UA Negative Negative   Ketones, UA Negative Negative   RBC, UA 1+ (A) Negative   Bilirubin, UA Negative Negative   Urobilinogen, Ur 0.2 0.2 - 1.0 mg/dL   Nitrite, UA Negative Negative    Microscopic Examination See below:    Urinalysis Reflex Comment       Assessment & Plan:   Problem List Items Addressed This Visit      Respiratory   Rhinitis,  allergic    Stable and under good control, continue current regimen        Digestive   GERD (gastroesophageal reflux disease)    Stable, well controlled on prevacid without breakthrough sxs. Continue current regimen      Relevant Medications   Fiber Adult Gummies 2 g CHEW   lansoprazole (PREVACID) 30 MG capsule     Endocrine   Hypothyroidism - Primary    Recheck TSH, adjust as needed. Continue synthroid regimen      Relevant Medications   levothyroxine (SYNTHROID) 50 MCG tablet   Other Relevant Orders   TSH (Completed)     Other   History of ITP    Recheck CBC      Female stress incontinence    Tofranil not seeming to work as well for her at this point. Will r/o infection, consider medication change and refer to Urology for further management      Relevant Orders   Ambulatory referral to Urology   Hot flashes, menopausal    Discussed again her alternatives for helping control her menopausal sxs. Recommended trial of non HRT medication but pt declining at this time. Does agree to trying prn estrace cream for her localized sxs. Discussed relatively low risk given low systemic absorption.        Other Visit Diagnoses    Annual physical exam       Relevant Orders   CBC with Differential/Platelet (Completed)   Comprehensive metabolic panel (Completed)   Lipid Panel w/o Chol/HDL Ratio (Completed)   UA/M w/rflx Culture, Routine (Completed)   Urinary frequency       U/A and wet prep neg, suspect sxs related to her stress incontinence. Referral placed to Urology for further evaluation   Relevant Orders   WET PREP FOR [place], [place] (Completed)       Follow up plan: Return in about 6 months (around 09/02/2019) for 6 month f/u.   LABORATORY TESTING:  - Pap smear: up to date  IMMUNIZATIONS:   -  Tdap: Tetanus vaccination status reviewed: last tetanus booster within 10 years. - Influenza: Up to date  SCREENING: -Mammogram: Up to date  - Colonoscopy: Up to date   PATIENT COUNSELING:   Advised to take 1 mg of folate supplement per day if capable of pregnancy.   Sexuality: Discussed sexually transmitted diseases, partner selection, use of condoms, avoidance of unintended pregnancy  and contraceptive alternatives.   Advised to avoid cigarette smoking.  I discussed with the patient that most people either abstain from alcohol or drink within safe limits (<=14/week and <=4 drinks/occasion for males, <=7/weeks and <= 3 drinks/occasion for females) and that the risk for alcohol disorders and other health effects rises proportionally with the number of drinks per week and how often a drinker exceeds daily limits.  Discussed cessation/primary prevention of drug use and availability of treatment for abuse.   Diet: Encouraged to adjust caloric intake to maintain  or achieve ideal body weight, to reduce intake of dietary saturated fat and total fat, to limit sodium intake by avoiding high sodium foods and not adding table salt, and to maintain adequate dietary potassium and calcium preferably from fresh fruits, vegetables, and low-fat dairy products.    stressed the importance of regular exercise  Injury prevention: Discussed safety belts, safety helmets, smoke detector, smoking near bedding or upholstery.   Dental health: Discussed importance of regular tooth brushing, flossing, and dental visits.    NEXT PREVENTATIVE PHYSICAL DUE IN 1  YEAR. Return in about 6 months (around 09/02/2019) for 6 month f/u.

## 2019-03-06 LAB — CBC WITH DIFFERENTIAL/PLATELET
Basophils Absolute: 0.1 10*3/uL (ref 0.0–0.2)
Basos: 1 %
EOS (ABSOLUTE): 0.1 10*3/uL (ref 0.0–0.4)
Eos: 1 %
Hematocrit: 39.1 % (ref 34.0–46.6)
Hemoglobin: 12.7 g/dL (ref 11.1–15.9)
Immature Grans (Abs): 0 10*3/uL (ref 0.0–0.1)
Immature Granulocytes: 1 %
Lymphocytes Absolute: 2.8 10*3/uL (ref 0.7–3.1)
Lymphs: 34 %
MCH: 27 pg (ref 26.6–33.0)
MCHC: 32.5 g/dL (ref 31.5–35.7)
MCV: 83 fL (ref 79–97)
Monocytes Absolute: 0.7 10*3/uL (ref 0.1–0.9)
Monocytes: 8 %
Neutrophils Absolute: 4.7 10*3/uL (ref 1.4–7.0)
Neutrophils: 55 %
Platelets: 360 10*3/uL (ref 150–450)
RBC: 4.71 x10E6/uL (ref 3.77–5.28)
RDW: 12.3 % (ref 11.7–15.4)
WBC: 8.4 10*3/uL (ref 3.4–10.8)

## 2019-03-06 LAB — LIPID PANEL W/O CHOL/HDL RATIO
Cholesterol, Total: 165 mg/dL (ref 100–199)
HDL: 60 mg/dL (ref >39)
LDL Chol Calc (NIH): 75 mg/dL (ref 0–99)
Triglycerides: 178 mg/dL — ABNORMAL HIGH (ref 0–149)
VLDL Cholesterol Cal: 30 mg/dL (ref 5–40)

## 2019-03-06 LAB — COMPREHENSIVE METABOLIC PANEL
ALT: 13 IU/L (ref 0–32)
AST: 20 IU/L (ref 0–40)
Albumin/Globulin Ratio: 1.6 (ref 1.2–2.2)
Albumin: 4.2 g/dL (ref 3.8–4.9)
Alkaline Phosphatase: 96 IU/L (ref 39–117)
BUN/Creatinine Ratio: 13 (ref 9–23)
BUN: 10 mg/dL (ref 6–24)
Bilirubin Total: <0.2 mg/dL (ref 0.0–1.2)
CO2: 23 mmol/L (ref 20–29)
Calcium: 9.4 mg/dL (ref 8.7–10.2)
Chloride: 102 mmol/L (ref 96–106)
Creatinine, Ser: 0.75 mg/dL (ref 0.57–1.00)
GFR calc Af Amer: 107 mL/min/{1.73_m2} (ref >59)
GFR calc non Af Amer: 93 mL/min/{1.73_m2} (ref >59)
Globulin, Total: 2.6 g/dL (ref 1.5–4.5)
Glucose: 79 mg/dL (ref 65–99)
Potassium: 4.2 mmol/L (ref 3.5–5.2)
Sodium: 138 mmol/L (ref 134–144)
Total Protein: 6.8 g/dL (ref 6.0–8.5)

## 2019-03-06 LAB — TSH: TSH: 3.66 u[IU]/mL (ref 0.450–4.500)

## 2019-03-07 LAB — UA/M W/RFLX CULTURE, ROUTINE
Bilirubin, UA: NEGATIVE
Glucose, UA: NEGATIVE
Ketones, UA: NEGATIVE
Leukocytes,UA: NEGATIVE
Nitrite, UA: NEGATIVE
Protein,UA: NEGATIVE
Specific Gravity, UA: 1.015 (ref 1.005–1.030)
Urobilinogen, Ur: 0.2 mg/dL (ref 0.2–1.0)
pH, UA: 5.5 (ref 5.0–7.5)

## 2019-03-07 LAB — MICROSCOPIC EXAMINATION

## 2019-03-07 LAB — URINE CULTURE, REFLEX

## 2019-03-07 MED ORDER — LEVOTHYROXINE SODIUM 50 MCG PO TABS: 50.0000 ug | ORAL_TABLET | Freq: Every day | ORAL | 1 refills | Status: DC

## 2019-03-07 MED ORDER — FLUTICASONE PROPIONATE 50 MCG/ACT NA SUSP: 2.0000 | Freq: Two times a day (BID) | NASAL | 11 refills | Status: DC

## 2019-03-07 MED ORDER — LANSOPRAZOLE 30 MG PO CPDR: 30.0000 mg | DELAYED_RELEASE_CAPSULE | Freq: Every day | ORAL | 1 refills | Status: DC

## 2019-03-09 DIAGNOSIS — M9901 Segmental and somatic dysfunction of cervical region: Secondary | ICD-10-CM | POA: Diagnosis not present

## 2019-03-09 DIAGNOSIS — M9902 Segmental and somatic dysfunction of thoracic region: Secondary | ICD-10-CM | POA: Diagnosis not present

## 2019-03-09 DIAGNOSIS — M5414 Radiculopathy, thoracic region: Secondary | ICD-10-CM | POA: Diagnosis not present

## 2019-03-09 DIAGNOSIS — R519 Headache, unspecified: Secondary | ICD-10-CM | POA: Diagnosis not present

## 2019-03-12 NOTE — Assessment & Plan Note (Signed)
Stable, well controlled on prevacid without breakthrough sxs. Continue current regimen

## 2019-03-12 NOTE — Assessment & Plan Note (Signed)
Stable and under good control, continue current regimen 

## 2019-03-12 NOTE — Assessment & Plan Note (Signed)
Recheck CBC. 

## 2019-03-12 NOTE — Assessment & Plan Note (Signed)
Recheck TSH, adjust as needed. Continue synthroid regimen 

## 2019-03-12 NOTE — Assessment & Plan Note (Signed)
Tofranil not seeming to work as well for her at this point. Will r/o infection, consider medication change and refer to Urology for further management

## 2019-03-12 NOTE — Assessment & Plan Note (Signed)
Discussed again her alternatives for helping control her menopausal sxs. Recommended trial of non HRT medication but pt declining at this time. Does agree to trying prn estrace cream for her localized sxs. Discussed relatively low risk given low systemic absorption.

## 2019-04-09 ENCOUNTER — Ambulatory Visit: Payer: BC Managed Care – PPO | Admitting: Urology

## 2019-04-09 ENCOUNTER — Other Ambulatory Visit: Payer: Self-pay

## 2019-04-09 ENCOUNTER — Encounter: Payer: Self-pay | Admitting: Urology

## 2019-04-09 VITALS — BP 128/80 | HR 74 | Ht 62.0 in | Wt 155.0 lb

## 2019-04-09 DIAGNOSIS — N3946 Mixed incontinence: Secondary | ICD-10-CM

## 2019-04-09 LAB — URINALYSIS, COMPLETE
Bilirubin, UA: NEGATIVE
Glucose, UA: NEGATIVE
Ketones, UA: NEGATIVE
Leukocytes,UA: NEGATIVE
Nitrite, UA: NEGATIVE
Protein,UA: NEGATIVE
Specific Gravity, UA: 1.01 (ref 1.005–1.030)
Urobilinogen, Ur: 0.2 mg/dL (ref 0.2–1.0)
pH, UA: 7 (ref 5.0–7.5)

## 2019-04-09 LAB — MICROSCOPIC EXAMINATION
Epithelial Cells (non renal): NONE SEEN /hpf (ref 0–10)
RBC, Urine: NONE SEEN /hpf (ref 0–2)

## 2019-04-09 MED ORDER — MIRABEGRON ER 50 MG PO TB24: 50.0000 mg | ORAL_TABLET | Freq: Every day | ORAL | 11 refills | Status: DC

## 2019-04-09 NOTE — Addendum Note (Signed)
Addended by: Verlene Mayer A on: 04/09/2019 11:07 AM   Modules accepted: Orders

## 2019-04-09 NOTE — Progress Notes (Signed)
04/09/2019 10:13 AM   Martha Henry [birth year] [id number]  Referring provider: Volney American, PA-C [address] [place],  [place] [address]  Chief Complaint  Patient presents with  . Other    HPI: I was consulted to assist the patient is urinary incontinence.  She has had this for many years and saw another urologist Dr. Jacqlyn Larsen.  He has her on imipramine that may or may not be helping.  She can leak with coughing sneezing not bending lifting.  She has no urge incontinence.  She has no bedwetting.  When she finished the shower she can has some urgency and dribble a small amount of urine.  She wears 2 or 3 liners per day that are damp.  She voids every 1 hour and cannot hold it for 2 hours.  Gets up once or twice at night  She has had a hysterectomy.  She is her regular bowel movements.  She denies history of kidney stones previous to surgery and bladder infections.  She was having some pressure in the lower abdomen but this settled down and she describes her recent negative urinalysis  Modifying factors: There are no other modifying factors  Associated signs and symptoms: There are no other associated signs and symptoms Aggravating and relieving factors: There are no other aggravating or relieving factors Severity: Moderate Duration: Persistent   PMH: Past Medical History:  Diagnosis Date  . GERD (gastroesophageal reflux disease)   . Headache    sinus  . History of abnormal mammogram    seen by Dr. Jamal Collin  . Hypothyroidism   . ITP (idiopathic thrombocytopenic purpura) 1990   no issues in several years  . Tubular adenoma of colon 2016  . Wears contact lenses     Surgical History: Past Surgical History:  Procedure Laterality Date  . ABDOMINAL HYSTERECTOMY  2003   partial/only ovaries remain  . APPENDECTOMY  2009  . CHOLECYSTECTOMY  2009  . COLONOSCOPY WITH PROPOFOL N/A 02/26/2015   Procedure: COLONOSCOPY WITH PROPOFOL;  Surgeon: Christene Lye,  MD;  Location: [facility];  Service: Endoscopy;  Laterality: N/A;  . NASAL TURBINATE REDUCTION Bilateral 05/02/2015   Procedure: TURBINATE REDUCTION/SUBMUCOSAL RESECTION;  Surgeon: Beverly Gust, MD;  Location: [place];  Service: ENT;  Laterality: Bilateral;  . SEPTOPLASTY N/A 05/02/2015   Procedure: SEPTOPLASTY;  Surgeon: Beverly Gust, MD;  Location: [place];  Service: ENT;  Laterality: N/A;  . TONSILLECTOMY  1975    Home Medications:  Allergies as of 04/09/2019      Reactions   Erythromycin Other (See Comments)   Chest pain   Aspirin Other (See Comments)   Platelet problems   Ciprofloxacin Other (See Comments)   constipation      Medication List       Accurate as of April 09, 2019 10:13 AM. If you have any questions, ask your nurse or doctor.        cetirizine 10 MG tablet Commonly known as: ZYRTEC Take 10 mg by mouth daily.   estradiol 0.1 MG/GM vaginal cream Commonly known as: ESTRACE VAGINAL Place 1 Applicatorful vaginally at bedtime as needed.   Fiber Adult Gummies 2 g Chew Chew by mouth.   fluticasone 50 MCG/ACT nasal spray Commonly known as: FLONASE Place 2 sprays into both nostrils 2 (two) times daily.   imipramine 25 MG tablet Commonly known as: TOFRANIL Take 1 tablet (25 mg total) by mouth daily.   lansoprazole 30 MG capsule Commonly known as:  Prevacid Take 1 capsule (30 mg total) by mouth daily.   levothyroxine 50 MCG tablet Commonly known as: SYNTHROID Take 1 tablet (50 mcg total) by mouth daily.   montelukast 10 MG tablet Commonly known as: SINGULAIR Take 1 tablet (10 mg total) by mouth at bedtime.   multivitamin with minerals tablet Take 1 tablet by mouth daily.   sennosides-docusate sodium 8.6-50 MG tablet Commonly known as: SENOKOT-S Take 1 tablet by mouth daily.   Turmeric Curcumin 500 MG Caps Take by mouth.   Viactiv [id number] MG-UNT-MCG Chew Generic drug: Calcium-Vitamin D-Vitamin K Chew by  mouth.   VITAMIN B-12 PO Take by mouth daily.       Allergies:  Allergies  Allergen Reactions  . Erythromycin Other (See Comments)    Chest pain  . Aspirin Other (See Comments)    Platelet problems  . Ciprofloxacin Other (See Comments)    constipation    Family History: Family History  Problem Relation Age of Onset  . Cancer Mother 42       breast  . Breast cancer Mother 3  . Colon cancer Father 68  . Cancer Father        colon  . Breast cancer Maternal Aunt   . Cancer Maternal Aunt        breast  . Ovarian cancer Maternal Aunt        Stage 4  . Lung cancer Maternal Grandfather   . Cervical cancer Maternal Grandmother     Social History:  reports that she has never smoked. She has never used smokeless tobacco. She reports that she does not drink alcohol or use drugs.  ROS: UROLOGY Frequent Urination?: Yes Hard to postpone urination?: No Burning/pain with urination?: No Get up at night to urinate?: Yes Leakage of urine?: Yes Urine stream starts and stops?: No Trouble starting stream?: Yes Do you have to strain to urinate?: No Blood in urine?: No Urinary tract infection?: No Sexually transmitted disease?: No Injury to kidneys or bladder?: No Painful intercourse?: Yes Weak stream?: Yes Currently pregnant?: No Vaginal bleeding?: No Last menstrual period?: N  Gastrointestinal Nausea?: No Vomiting?: Yes Indigestion/heartburn?: No Diarrhea?: No Constipation?: No  Constitutional Fever: No Night sweats?: No Weight loss?: No Fatigue?: No  Skin Skin rash/lesions?: No Itching?: No  Eyes Blurred vision?: No Double vision?: No  Ears/Nose/Throat Sore throat?: No Sinus problems?: Yes  Hematologic/Lymphatic Swollen glands?: No Easy bruising?: Yes  Cardiovascular Leg swelling?: No Chest pain?: No  Respiratory Cough?: No Shortness of breath?: No  Endocrine Excessive thirst?: No  Musculoskeletal Back pain?: No Joint pain?: No   Neurological Headaches?: Yes Dizziness?: No  Psychologic Depression?: No Anxiety?: No  Physical Exam: BP 128/80   Pulse 74   Ht 5\' 2"  (1.575 m)   Wt 70.3 kg   BMI 28.35 kg/m   Constitutional:  Alert and oriented, No acute distress. HEENT: [place] AT, moist mucus membranes.  Trachea midline, no masses. Cardiovascular: No clubbing, cyanosis, or edema. Respiratory: Normal respiratory effort, no increased work of breathing. GI: Abdomen is soft, nontender, nondistended, no abdominal masses GU:   Mild grade 2 hypermobility the bladder neck and no stress incontinence.  Tissue is a bit elastic.  Asymptomatic distal grade 1 rectocele Skin: No rashes, bruises or suspicious lesions. Lymph: No cervical or inguinal adenopathy. Neurologic: Grossly intact, no focal deficits, moving all 4 extremities. Psychiatric: Normal mood and affect.  Laboratory Data: Lab Results  Component Value Date   WBC 8.4 03/05/2019   HGB  12.7 03/05/2019   HCT 39.1 03/05/2019   MCV 83 03/05/2019   PLT 360 03/05/2019    Lab Results  Component Value Date   CREATININE 0.75 03/05/2019    No results found for: PSA  No results found for: TESTOSTERONE  No results found for: HGBA1C  Urinalysis    Component Value Date/Time   APPEARANCEUR Clear 03/05/2019 1350   GLUCOSEU Negative 03/05/2019 1350   BILIRUBINUR Negative 03/05/2019 1350   PROTEINUR Negative 03/05/2019 1350   NITRITE Negative 03/05/2019 1350   LEUKOCYTESUR Negative 03/05/2019 1350    Pertinent Imaging:   Assessment & Plan: Patient saw Dr. Jacqlyn Larsen for years with mild stress incontinence which she currently has.  She does have I believe perhaps some urgency associated leakage in the shower.  She does have a frequent bladder voiding every 1 hour with mild nocturia.  The role of trying some other medication change urodynamics discussed.    I was not surprised that the patient's not sure if she wants to proceed with urodynamics but at the same time  does not want to live with the problem.  She took my advice and I will see her back in 6 weeks on Myrbetriq samples and prescription.  We can always order the test pending results and goals.    There are no diagnoses linked to this encounter.  No follow-ups on file.  Reece Packer, MD  [facility] [address], [place], [place] [address] [phone]

## 2019-04-09 NOTE — Patient Instructions (Signed)
Urodynamic Testing What is urodynamic testing?  Urodynamic testing is a set of tests and X-rays. These tests help to find out how well your bladder and the part of your body that drains pee from the bladder (urethra) are working. Why do I need this testing? You may need these tests if you:  Are leaking pee (urine).  Have trouble starting or stopping peeing (urination).  Pee often or it hurts to pee.  Have urinary tract infections often.  Are not able to empty your bladder.  Have strong urges to pee.  Have a weak flow of pee. How do I prepare for the tests?  Ask your doctor about: ? Changing or stopping your normal medicines. This is important if you take diabetes medicines or blood thinners. ? Whether you should arrive for the test having to pee.  Tell your doctor about: ? Any allergies you have. ? All medicines you are taking, including vitamins, herbs, eye drops, creams, and over-the-counter medicines. ? Whether you are pregnant or may be pregnant. What are the risks? In general, these tests are safe. But some of the tests have risks. These may include:  Discomfort.  Feeling a need to pee often.  Bleeding.  Infection.  Allergic reactions to medicines or dyes. How are the tests done? The tests may be done in one visit or may be done over a few visits. You may be given an antibiotic medicine to help keep you from getting an infection. The types of tests done may include: Uroflowmetry This test measures how much you pee and how long it takes. You will pee into a type of toilet or device that sends measurements to a computer. Postvoid residual measurement This test measures how much pee is left in your bladder after you pee. It may be done by:  Using sound waves and a computer to create pictures of your bladder (ultrasound).  Putting a thin, flexible tube (catheter) into your bladder to take out the pee that is left so it can be measured. Cystometric testing This  test measures how much pressure there is in your bladder before you pee and as you pee.  You may be given a medicine to numb the area (local anesthetic).  The area around the opening of your urethra will be cleaned.  A thin, flexible tube will be used to empty your bladder.  A flexible tube that can measure pressure will then be placed. Your bladder will be filled with germ-free water.  Pressure will be measured: ? As your bladder fills. ? When you feel the need to pee. ? As your bladder is emptied.  In some cases, your bladder may be filled with a dye that shows up on X-rays (contrast material) so that X-ray pictures can be taken during the test. Electromyogram This test measures the activity of the nerves and muscles in your bladder and in the tube that empties your bladder. Sticky patches (electrodes) will be placed on your body to measure electrical activity. What happens after the testing?  You should be able to go home right away.  You can do your usual activities.  You may be told to drink a glass of water every 30 minutes. Do this for the first 2 hours you are home.  Taking a warm bath or using a warm, wet towel may relieve any discomfort. Let your doctor know if you have:  Pain.  Blood in your pee.  Chills.  Fever. What do my test results mean? Talk   of water every 30 minutes. Do this for the first 2 hours you are home.  · Taking a warm bath or using a warm, wet towel may relieve any discomfort.  Let your doctor know if you have:  · Pain.  · Blood in your pee.  · Chills.  · Fever.  What do my test results mean?  Talk with your doctor about what your results mean. These test results can help your doctor find out how well your bladder and the tube that empties your bladder are working. Your results and your symptoms can help your doctor find what might be causing your problems.  Questions to ask your doctor  Ask your doctor, or the department that is doing the test:  · When will my results be ready?  · How will I get my results?  · What are my treatment options?  · What other tests do I need?  · What are my next steps?  Summary  · Urodynamic testing is a set of tests and X-rays.  · These tests help to  find out how well your bladder and the part of your body that drains pee from the bladder are working.  · Your results and your symptoms can help your doctor find what might be causing your problems.  · Talk with your doctor about what your results mean.  This information is not intended to replace advice given to you by your health care provider. Make sure you discuss any questions you have with your health care provider.  Document Released: 04/01/2008 Document Revised: 08/08/2018 Document Reviewed: 02/21/2017  Elsevier Patient Education © 2020 [employer]

## 2019-04-10 ENCOUNTER — Other Ambulatory Visit: Payer: Self-pay | Admitting: Family Medicine

## 2019-04-10 NOTE — Addendum Note (Signed)
Addended by: Verlene Mayer A on: 04/10/2019 02:01 PM   Modules accepted: Orders

## 2019-04-24 ENCOUNTER — Telehealth: Payer: Self-pay | Admitting: Urology

## 2019-04-24 NOTE — Telephone Encounter (Signed)
Pt. Would like to speak to someone about medication side effects she is experiencing. Which include, insomnia, increased heart rate and increased blood pressure. Please advise

## 2019-04-24 NOTE — Telephone Encounter (Signed)
Spoke with patient she was instructed to stop medication for a week to see if symptoms resolve if not she is to contact PCP. If symptoms resolve she may restart medication and if symptoms return we then know the medication is causing the side effects

## 2019-05-09 DIAGNOSIS — M9902 Segmental and somatic dysfunction of thoracic region: Secondary | ICD-10-CM | POA: Diagnosis not present

## 2019-05-09 DIAGNOSIS — M5414 Radiculopathy, thoracic region: Secondary | ICD-10-CM | POA: Diagnosis not present

## 2019-05-09 DIAGNOSIS — R519 Headache, unspecified: Secondary | ICD-10-CM | POA: Diagnosis not present

## 2019-05-09 DIAGNOSIS — M9901 Segmental and somatic dysfunction of cervical region: Secondary | ICD-10-CM | POA: Diagnosis not present

## 2019-05-21 ENCOUNTER — Encounter: Payer: Self-pay | Admitting: Family Medicine

## 2019-05-21 ENCOUNTER — Other Ambulatory Visit: Payer: Self-pay

## 2019-05-21 ENCOUNTER — Ambulatory Visit: Payer: BC Managed Care – PPO | Admitting: Urology

## 2019-05-21 ENCOUNTER — Encounter: Payer: Self-pay | Admitting: Urology

## 2019-05-21 ENCOUNTER — Ambulatory Visit (INDEPENDENT_AMBULATORY_CARE_PROVIDER_SITE_OTHER): Payer: BC Managed Care – PPO | Admitting: Family Medicine

## 2019-05-21 VITALS — BP 130/87 | HR 109 | Ht 62.0 in | Wt 155.0 lb

## 2019-05-21 DIAGNOSIS — N3946 Mixed incontinence: Secondary | ICD-10-CM

## 2019-05-21 DIAGNOSIS — J0100 Acute maxillary sinusitis, unspecified: Secondary | ICD-10-CM | POA: Diagnosis not present

## 2019-05-21 MED ORDER — AMOXICILLIN-POT CLAVULANATE 875-125 MG PO TABS: 1.0000 | ORAL_TABLET | Freq: Two times a day (BID) | ORAL | 0 refills | Status: DC

## 2019-05-21 NOTE — Progress Notes (Signed)
05/21/2019 8:56 AM   Martha Henry 1965/08/22 [id number]  Referring provider: Volney American, PA-C [address] [place],  [place] [address]  Chief Complaint  Patient presents with  . Follow-up    HPI: I was consulted to assist the patient is urinary incontinence.  She has had this for many years and saw another urologist Dr. Jacqlyn Larsen.  He has her on imipramine that may or may not be helping.  She can leak with coughing sneezing not bending lifting.  She has no urge incontinence.  She has no bedwetting.  When she finished the shower she can has some urgency and dribble a small amount of urine.  She wears 2 or 3 liners per day that are damp.  She voids every 1 hour and cannot hold it for 2 hours.  Gets up once or twice at night  She was having some pressure in the lower abdomen but this settled down and she describes her recent negative urinalysis  Mild grade 2 hypermobility the bladder neck and no stress incontinence.  Tissue is a bit elastic.  Asymptomatic distal grade 1 rectocele  Patient saw Dr. Jacqlyn Larsen for years with mild stress incontinence which she currently has.  She does have I believe perhaps some urgency associated leakage in the shower.  She does have a frequent bladder voiding every 1 hour with mild nocturia.  The role of trying some other medication change urodynamics discussed.    I was not surprised that the patient's not sure if she wants to proceed with urodynamics but at the same time does not want to live with the problem.  She took my advice and I will see her back in 6 weeks on Myrbetriq samples and prescription.  We can always order the test pending results and goals.  Today Mild stress incontinence a bit better Much less frequency and not leaking "" all the time small amounts she is very pleased.  Clinically not infected   PMH: Past Medical History:  Diagnosis Date  . GERD (gastroesophageal reflux disease)   . Headache    sinus  . History of  abnormal mammogram    seen by Dr. Jamal Collin  . Hypothyroidism   . ITP (idiopathic thrombocytopenic purpura) 1990   no issues in several years  . Tubular adenoma of colon 2016  . Wears contact lenses     Surgical History: Past Surgical History:  Procedure Laterality Date  . ABDOMINAL HYSTERECTOMY  2003   partial/only ovaries remain  . APPENDECTOMY  2009  . CHOLECYSTECTOMY  2009  . COLONOSCOPY WITH PROPOFOL N/A 02/26/2015   Procedure: COLONOSCOPY WITH PROPOFOL;  Surgeon: Christene Lye, MD;  Location: [facility];  Service: Endoscopy;  Laterality: N/A;  . NASAL TURBINATE REDUCTION Bilateral 05/02/2015   Procedure: TURBINATE REDUCTION/SUBMUCOSAL RESECTION;  Surgeon: Beverly Gust, MD;  Location: [facility];  Service: ENT;  Laterality: Bilateral;  . SEPTOPLASTY N/A 05/02/2015   Procedure: SEPTOPLASTY;  Surgeon: Beverly Gust, MD;  Location: [facility];  Service: ENT;  Laterality: N/A;  . TONSILLECTOMY  1975    Home Medications:  Allergies as of 05/21/2019      Reactions   Erythromycin Other (See Comments)   Chest pain   Aspirin Other (See Comments)   Platelet problems   Ciprofloxacin Other (See Comments)   constipation      Medication List       Accurate as of May 21, 2019  8:56 AM. If you have any questions, ask  your nurse or doctor.        cetirizine 10 MG tablet Commonly known as: ZYRTEC Take 10 mg by mouth daily.   estradiol 0.1 MG/GM vaginal cream Commonly known as: ESTRACE VAGINAL Place 1 Applicatorful vaginally at bedtime as needed.   Fiber Adult Gummies 2 g Chew Chew by mouth.   fluticasone 50 MCG/ACT nasal spray Commonly known as: FLONASE Place 2 sprays into both nostrils 2 (two) times daily.   imipramine 25 MG tablet Commonly known as: TOFRANIL Take 1 tablet (25 mg total) by mouth daily.   lansoprazole 30 MG capsule Commonly known as: Prevacid Take 1 capsule (30 mg total) by mouth daily.   levothyroxine 50 MCG  tablet Commonly known as: SYNTHROID Take 1 tablet (50 mcg total) by mouth daily.   mirabegron ER 50 MG Tb24 tablet Commonly known as: MYRBETRIQ Take 1 tablet (50 mg total) by mouth daily.   montelukast 10 MG tablet Commonly known as: SINGULAIR Take 1 tablet (10 mg total) by mouth at bedtime.   multivitamin with minerals tablet Take 1 tablet by mouth daily.   sennosides-docusate sodium 8.6-50 MG tablet Commonly known as: SENOKOT-S Take 1 tablet by mouth daily.   Turmeric Curcumin 500 MG Caps Take by mouth.   Viactiv [id number] MG-UNT-MCG Chew Generic drug: Calcium-Vitamin D-Vitamin K Chew by mouth.   VITAMIN B-12 PO Take by mouth daily.       Allergies:  Allergies  Allergen Reactions  . Erythromycin Other (See Comments)    Chest pain  . Aspirin Other (See Comments)    Platelet problems  . Ciprofloxacin Other (See Comments)    constipation    Family History: Family History  Problem Relation Age of Onset  . Cancer Mother 85       breast  . Breast cancer Mother 2  . Colon cancer Father 15  . Cancer Father        colon  . Breast cancer Maternal Aunt   . Cancer Maternal Aunt        breast  . Ovarian cancer Maternal Aunt        Stage 4  . Lung cancer Maternal Grandfather   . Cervical cancer Maternal Grandmother     Social History:  reports that she has never smoked. She has never used smokeless tobacco. She reports that she does not drink alcohol or use drugs.  ROS:                                        Physical Exam: There were no vitals taken for this visit.   Laboratory Data: Lab Results  Component Value Date   WBC 8.4 03/05/2019   HGB 12.7 03/05/2019   HCT 39.1 03/05/2019   MCV 83 03/05/2019   PLT 360 03/05/2019    Lab Results  Component Value Date   CREATININE 0.75 03/05/2019    No results found for: PSA  No results found for: TESTOSTERONE  No results found for: HGBA1C  Urinalysis    Component Value  Date/Time   APPEARANCEUR Hazy (A) 04/09/2019 1107   GLUCOSEU Negative 04/09/2019 1107   BILIRUBINUR Negative 04/09/2019 1107   PROTEINUR Negative 04/09/2019 1107   NITRITE Negative 04/09/2019 1107   LEUKOCYTESUR Negative 04/09/2019 1107    Pertinent Imaging:   Assessment & Plan: Reassess durability in 4 months  There are no diagnoses linked to this encounter.  No follow-ups on file.  Reece Packer, MD  [facility] [address] [place], [place] [address] [phone]

## 2019-05-21 NOTE — Progress Notes (Signed)
There were no vitals taken for this visit.   Subjective:    Patient ID: Martha Henry, female    DOB: April 27, 1966, 54 y.o.   MRN: [id number]  HPI: Martha Henry is a 54 y.o. female  Chief Complaint  Patient presents with  . Sinusitis    pt states she has been having sneezing, headaches, runny nose, ear pain, tooth pain, and congestion for the past 1 to 2 weeks. States she has used a nasal rinse and mucinex with no relief    . This visit was completed via WebEx due to the restrictions of the COVID-19 pandemic. All issues as above were discussed and addressed. Physical exam was done as above through visual confirmation on WebEx. If it was felt that the patient should be evaluated in the office, they were directed there. The patient verbally consented to this visit. . Location of the patient: home . Location of the provider: work . Those involved with this call:  . Provider: Merrie Roof, PA-C . CMA: Lesle Chris, [place] . Front Desk/Registration: Jill Side  . Time spent on call: 15 minutes with patient face to face via video conference. More than 50% of this time was spent in counseling and coordination of care. 5 minutes total spent in review of patient's record and preparation of their chart. I verified patient identity using two factors (patient name and date of birth). Patient consents verbally to being seen via telemedicine visit today.   Sinus pain and pressure, congestion, cough, headache, teeth hurting, ear pressure and popping x 2 weeks. Denies CP, SOB, fevers, chills, sick contacts. Trying mucinex, sinus rinses, allergy regimen without relief. Hx of allergic rhinitis, frequently gets sinus infections despite consistent use of allergy regimen.   Relevant past medical, surgical, family and social history reviewed and updated as indicated. Interim medical history since our last visit reviewed. Allergies and medications reviewed and updated.  Review of Systems  Per  HPI unless specifically indicated above     Objective:    There were no vitals taken for this visit.  Wt Readings from Last 3 Encounters:  05/21/19 155 lb (70.3 kg)  04/09/19 155 lb (70.3 kg)  03/05/19 158 lb 3.2 oz (71.8 kg)    Physical Exam Vitals and nursing note reviewed.  Constitutional:      General: She is not in acute distress.    Appearance: Normal appearance.  HENT:     Head: Atraumatic.     Right Ear: External ear normal.     Left Ear: External ear normal.     Nose: Congestion present.     Mouth/Throat:     Mouth: Mucous membranes are moist.     Pharynx: Oropharynx is clear. Posterior oropharyngeal erythema present.  Eyes:     Extraocular Movements: Extraocular movements intact.     Conjunctiva/sclera: Conjunctivae normal.  Cardiovascular:     Comments: Unable to assess via virtual visit Pulmonary:     Effort: Pulmonary effort is normal. No respiratory distress.  Musculoskeletal:        General: Normal range of motion.     Cervical back: Normal range of motion.  Skin:    General: Skin is dry.     Findings: No erythema.  Neurological:     Mental Status: She is alert and oriented to person, place, and time.  Psychiatric:        Mood and Affect: Mood normal.        Thought Content: Thought content normal.  Judgment: Judgment normal.     Results for orders placed or performed in visit on 04/09/19  Microscopic Examination   URINE  Result Value Ref Range   WBC, UA 0-5 0 - 5 /hpf   RBC None seen 0 - 2 /hpf   Epithelial Cells (non renal) None seen 0 - 10 /hpf   Bacteria, UA Few None seen/Few  Urinalysis, Complete  Result Value Ref Range   Specific Gravity, UA 1.010 1.005 - 1.030   pH, UA 7.0 5.0 - 7.5   Color, UA Yellow Yellow   Appearance Ur Hazy (A) Clear   Leukocytes,UA Negative Negative   Protein,UA Negative Negative/Trace   Glucose, UA Negative Negative   Ketones, UA Negative Negative   RBC, UA Trace (A) Negative   Bilirubin, UA Negative  Negative   Urobilinogen, Ur 0.2 0.2 - 1.0 mg/dL   Nitrite, UA Negative Negative   Microscopic Examination See below:       Assessment & Plan:   Problem List Items Addressed This Visit    None    Visit Diagnoses    Acute maxillary sinusitis, recurrence not specified    -  Primary   Tx with augmentin, continued allergy regimen and mucinex, sinus rinses. F/u if worsening or not improving   Relevant Medications   amoxicillin-clavulanate (AUGMENTIN) 875-125 MG tablet       Follow up plan: Return if symptoms worsen or fail to improve.

## 2019-05-23 ENCOUNTER — Encounter: Payer: Self-pay | Admitting: Family Medicine

## 2019-05-23 ENCOUNTER — Other Ambulatory Visit: Payer: Self-pay | Admitting: Family Medicine

## 2019-05-23 MED ORDER — PREDNISONE 10 MG PO TABS: ORAL_TABLET | ORAL | 0 refills | Status: DC

## 2019-06-04 ENCOUNTER — Encounter: Payer: Self-pay | Admitting: Family Medicine

## 2019-06-05 ENCOUNTER — Telehealth: Payer: Self-pay | Admitting: Family Medicine

## 2019-06-05 NOTE — Telephone Encounter (Signed)
Patient is calling she did not receive the antibotic can Martha Henry send this to [facility] on [address]. Please advise Cb- [phone]

## 2019-06-05 NOTE — Telephone Encounter (Signed)
Please send patients antibiotic in, she states that you were going to try her on something different and if that didn't work you would refer to ENT

## 2019-06-06 DIAGNOSIS — M9901 Segmental and somatic dysfunction of cervical region: Secondary | ICD-10-CM | POA: Diagnosis not present

## 2019-06-06 DIAGNOSIS — M9902 Segmental and somatic dysfunction of thoracic region: Secondary | ICD-10-CM | POA: Diagnosis not present

## 2019-06-06 DIAGNOSIS — R519 Headache, unspecified: Secondary | ICD-10-CM | POA: Diagnosis not present

## 2019-06-06 DIAGNOSIS — M5414 Radiculopathy, thoracic region: Secondary | ICD-10-CM | POA: Diagnosis not present

## 2019-06-06 MED ORDER — DOXYCYCLINE HYCLATE 100 MG PO TABS: 100.0000 mg | ORAL_TABLET | Freq: Two times a day (BID) | ORAL | 0 refills | Status: DC

## 2019-06-06 NOTE — Telephone Encounter (Signed)
Rx sent 

## 2019-06-19 ENCOUNTER — Telehealth: Payer: Self-pay | Admitting: Urology

## 2019-06-19 NOTE — Telephone Encounter (Signed)
Lm for pt to cb she never scheduled her UDS study   Sharyn Lull

## 2019-06-26 DIAGNOSIS — M9902 Segmental and somatic dysfunction of thoracic region: Secondary | ICD-10-CM | POA: Diagnosis not present

## 2019-06-26 DIAGNOSIS — M9901 Segmental and somatic dysfunction of cervical region: Secondary | ICD-10-CM | POA: Diagnosis not present

## 2019-06-26 DIAGNOSIS — M5414 Radiculopathy, thoracic region: Secondary | ICD-10-CM | POA: Diagnosis not present

## 2019-06-26 DIAGNOSIS — R519 Headache, unspecified: Secondary | ICD-10-CM | POA: Diagnosis not present

## 2019-06-27 ENCOUNTER — Ambulatory Visit: Payer: BC Managed Care – PPO | Admitting: Family Medicine

## 2019-06-27 ENCOUNTER — Telehealth (INDEPENDENT_AMBULATORY_CARE_PROVIDER_SITE_OTHER): Payer: BC Managed Care – PPO | Admitting: Family Medicine

## 2019-06-27 ENCOUNTER — Other Ambulatory Visit: Payer: Self-pay

## 2019-06-27 ENCOUNTER — Encounter: Payer: Self-pay | Admitting: Family Medicine

## 2019-06-27 VITALS — Temp 96.8°F | Ht 62.0 in | Wt 160.0 lb

## 2019-06-27 DIAGNOSIS — M2550 Pain in unspecified joint: Secondary | ICD-10-CM | POA: Diagnosis not present

## 2019-06-27 DIAGNOSIS — E039 Hypothyroidism, unspecified: Secondary | ICD-10-CM

## 2019-06-27 DIAGNOSIS — R5383 Other fatigue: Secondary | ICD-10-CM | POA: Diagnosis not present

## 2019-06-27 MED ORDER — LEVOTHYROXINE SODIUM 75 MCG PO TABS: 75.0000 ug | ORAL_TABLET | Freq: Every day | ORAL | 1 refills | Status: DC

## 2019-06-27 NOTE — Assessment & Plan Note (Signed)
Will mildly increase synthroid dose to 75 mg as her TSH was on higher end of normal at recent check and she's dealing with ongoing fatigue. Recheck in 4-6 weeks

## 2019-06-27 NOTE — Progress Notes (Signed)
Temp (!) 96.8 F (36 C) (Oral)   Ht 5' 2"  (1.575 m)   Wt 160 lb (72.6 kg)   BMI 29.26 kg/m    Subjective:    Patient ID: Martha Henry, female    DOB: [birth year], 54 y.o.   MRN: [id number]  HPI: Martha Henry is a 54 y.o. female  Chief Complaint  Patient presents with  . Fatigue    x about 2-3 weeks  . Generalized Body Aches  . Headache    . This visit was completed via MyChart due to the restrictions of the COVID-19 pandemic. All issues as above were discussed and addressed. Physical exam was done as above through visual confirmation on MyChart. If it was felt that the patient should be evaluated in the office, they were directed there. The patient verbally consented to this visit. . Location of the patient: home . Location of the provider: work . Those involved with this call:  . Provider: Merrie Roof, PA-C . CMA: Lesle Chris, [place] . Front Desk/Registration: Jill Side  . Time spent on call: 25 minutes with patient face to face via video conference. More than 50% of this time was spent in counseling and coordination of care. 5 minutes total spent in review of patient's record and preparation of their chart. I verified patient identity using two factors (patient name and date of birth). Patient consents verbally to being seen via telemedicine visit today.   The last few weeks feeling fatigued in the afternoons, joint pains in hips, knees, shoulders generalized, overall malaise. Taking tumeric with minimal benefit. Does have hypothyroidism, normal TSH 3 months ago. Takes B12 supplements. Denies rashes, fevers, recent viral illness or tick bites, recent travel, sick contacts. Does have some sciatica issues and having flare ups right now, working with a Restaurant manager, fast food.   Relevant past medical, surgical, family and social history reviewed and updated as indicated. Interim medical history since our last visit reviewed. Allergies and medications reviewed and  updated.  Review of Systems  Per HPI unless specifically indicated above     Objective:    Temp (!) 96.8 F (36 C) (Oral)   Ht 5' 2"  (1.575 m)   Wt 160 lb (72.6 kg)   BMI 29.26 kg/m   Wt Readings from Last 3 Encounters:  06/27/19 160 lb (72.6 kg)  05/21/19 155 lb (70.3 kg)  04/09/19 155 lb (70.3 kg)    Physical Exam Vitals and nursing note reviewed.  Constitutional:      General: She is not in acute distress.    Appearance: Normal appearance.  HENT:     Head: Atraumatic.     Right Ear: External ear normal.     Left Ear: External ear normal.     Nose: Nose normal. No congestion.     Mouth/Throat:     Mouth: Mucous membranes are moist.     Pharynx: Oropharynx is clear. No posterior oropharyngeal erythema.  Eyes:     Extraocular Movements: Extraocular movements intact.     Conjunctiva/sclera: Conjunctivae normal.  Cardiovascular:     Comments: Unable to assess via virtual visit Pulmonary:     Effort: Pulmonary effort is normal. No respiratory distress.  Musculoskeletal:        General: Normal range of motion.     Cervical back: Normal range of motion.  Skin:    General: Skin is dry.     Findings: No erythema.  Neurological:     Mental Status: She is alert and  oriented to person, place, and time.  Psychiatric:        Mood and Affect: Mood normal.        Thought Content: Thought content normal.        Judgment: Judgment normal.     Results for orders placed or performed in visit on 04/09/19  Microscopic Examination   URINE  Result Value Ref Range   WBC, UA 0-5 0 - 5 /hpf   RBC None seen 0 - 2 /hpf   Epithelial Cells (non renal) None seen 0 - 10 /hpf   Bacteria, UA Few None seen/Few  Urinalysis, Complete  Result Value Ref Range   Specific Gravity, UA 1.010 1.005 - 1.030   pH, UA 7.0 5.0 - 7.5   Color, UA Yellow Yellow   Appearance Ur Hazy (A) Clear   Leukocytes,UA Negative Negative   Protein,UA Negative Negative/Trace   Glucose, UA Negative Negative    Ketones, UA Negative Negative   RBC, UA Trace (A) Negative   Bilirubin, UA Negative Negative   Urobilinogen, Ur 0.2 0.2 - 1.0 mg/dL   Nitrite, UA Negative Negative   Microscopic Examination See below:       Assessment & Plan:   Problem List Items Addressed This Visit      Endocrine   Hypothyroidism    Will mildly increase synthroid dose to 75 mg as her TSH was on higher end of normal at recent check and she's dealing with ongoing fatigue. Recheck in 4-6 weeks      Relevant Medications   levothyroxine (SYNTHROID) 75 MCG tablet    Other Visit Diagnoses    Fatigue, unspecified type    -  Primary   Await lab workup, increase synthroid, increase exercise. F/u based on results   Relevant Orders   VITAMIN D 25 Hydroxy (Vit-D Deficiency, Fractures)   Vitamin B12   TSH   Arthralgia, unspecified joint       Check labs, including vitamin levels. Can start glucosamine supplements and increase exercise   Relevant Orders   Sed Rate (ESR)   Rheumatoid Factor   ANA w/Reflex       Follow up plan: Return for as scheduled.

## 2019-06-28 ENCOUNTER — Other Ambulatory Visit: Payer: BC Managed Care – PPO

## 2019-06-28 DIAGNOSIS — E559 Vitamin D deficiency, unspecified: Secondary | ICD-10-CM | POA: Diagnosis not present

## 2019-06-28 DIAGNOSIS — R6889 Other general symptoms and signs: Secondary | ICD-10-CM | POA: Diagnosis not present

## 2019-06-28 DIAGNOSIS — R5383 Other fatigue: Secondary | ICD-10-CM | POA: Diagnosis not present

## 2019-06-28 DIAGNOSIS — M2550 Pain in unspecified joint: Secondary | ICD-10-CM | POA: Diagnosis not present

## 2019-06-29 LAB — RHEUMATOID FACTOR: Rheumatoid fact SerPl-aCnc: <10 IU/mL (ref 0.0–13.9)

## 2019-06-29 LAB — VITAMIN D 25 HYDROXY (VIT D DEFICIENCY, FRACTURES): Vit D, 25-Hydroxy: 30.5 ng/mL (ref 30.0–100.0)

## 2019-06-29 LAB — SEDIMENTATION RATE: Sed Rate: 6 mm/hr (ref 0–40)

## 2019-06-29 LAB — ANA W/REFLEX: Anti Nuclear Antibody (ANA): NEGATIVE

## 2019-06-29 LAB — VITAMIN B12: Vitamin B-12: 1251 pg/mL — ABNORMAL HIGH (ref 232–1245)

## 2019-06-29 LAB — TSH: TSH: 2.56 u[IU]/mL (ref 0.450–4.500)

## 2019-07-02 DIAGNOSIS — U071 COVID-19: Secondary | ICD-10-CM

## 2019-07-02 HISTORY — DX: COVID-19: U07.1

## 2019-07-04 DIAGNOSIS — M9901 Segmental and somatic dysfunction of cervical region: Secondary | ICD-10-CM | POA: Diagnosis not present

## 2019-07-04 DIAGNOSIS — M9902 Segmental and somatic dysfunction of thoracic region: Secondary | ICD-10-CM | POA: Diagnosis not present

## 2019-07-04 DIAGNOSIS — R519 Headache, unspecified: Secondary | ICD-10-CM | POA: Diagnosis not present

## 2019-07-04 DIAGNOSIS — M5414 Radiculopathy, thoracic region: Secondary | ICD-10-CM | POA: Diagnosis not present

## 2019-07-09 DIAGNOSIS — R519 Headache, unspecified: Secondary | ICD-10-CM | POA: Diagnosis not present

## 2019-07-09 DIAGNOSIS — M9901 Segmental and somatic dysfunction of cervical region: Secondary | ICD-10-CM | POA: Diagnosis not present

## 2019-07-09 DIAGNOSIS — M5414 Radiculopathy, thoracic region: Secondary | ICD-10-CM | POA: Diagnosis not present

## 2019-07-09 DIAGNOSIS — M9902 Segmental and somatic dysfunction of thoracic region: Secondary | ICD-10-CM | POA: Diagnosis not present

## 2019-07-12 DIAGNOSIS — M5416 Radiculopathy, lumbar region: Secondary | ICD-10-CM | POA: Diagnosis not present

## 2019-07-24 ENCOUNTER — Encounter: Payer: Self-pay | Admitting: Family Medicine

## 2019-07-25 ENCOUNTER — Ambulatory Visit: Payer: Self-pay

## 2019-07-25 NOTE — Telephone Encounter (Signed)
Lvm to make this virtual apt  

## 2019-07-25 NOTE — Telephone Encounter (Signed)
Patient called stating that yesterday she had a positive COVID-19 test.  She states that today she has chest pressure no pain.  She denies feeling SOB even with activity.  She has lost taste and smell. She denies cough and fever. She states sometimes she has some mild body aches.  Her husband was Dx last week. She denies heart or lung history. Per protocol patient was told to go to ER for evaluation. Office FC. was contacted and no virtual visits are available.  Patient is refusing ER and requesting to speak with Merrie Roof. Will route note to office. Patient has expressed interest in infusion therapy.  Reason for Disposition . Chest pain or pressure  Answer Assessment - Initial Assessment Questions 1. COVID-19 DIAGNOSIS: "Who made your Coronavirus (COVID-19) diagnosis?" "Was it confirmed by a positive lab test?" If not diagnosed by a HCP, ask "Are there lots of cases (community spread) where you live?" (See public health department website, if unsure)     Tested positive yesterday 2. COVID-19 EXPOSURE: "Was there any known exposure to COVID before the symptoms began?" CDC Definition of close contact: within 6 feet (2 meters) for a total of 15 minutes or more over a 24-hour period.     Yes husband 3. ONSET: "When did the COVID-19 symptoms start?"      Started yesterday with chest pressure 4. WORST SYMPTOM: "What is your worst symptom?" (e.g., cough, fever, shortness of breath, muscle aches)     Chest pressure, head cold 5. COUGH: "Do you have a cough?" If so, ask: "How bad is the cough?"      no 6. FEVER: "Do you have a fever?" If so, ask: "What is your temperature, how was it measured, and when did it start?"    no 7. RESPIRATORY STATUS: "Describe your breathing?" (e.g., shortness of breath, wheezing, unable to speak)     No SOB just pressure on chest 8. BETTER-SAME-WORSE: "Are you getting better, staying the same or getting worse compared to yesterday?"  If getting worse, ask, "In what way?"     worse 9. HIGH RISK DISEASE: "Do you have any chronic medical problems?" (e.g., asthma, heart or lung disease, weak immune system, obesity, etc.)   none 10. PREGNANCY: "Is there any chance you are pregnant?" "When was your last menstrual period?"     N/A 11. OTHER SYMPTOMS: "Do you have any other symptoms?"  (e.g., chills, fatigue, headache, loss of smell or taste, muscle pain, sore throat; new loss of smell or taste especially support the diagnosis of COVID-19)      Loss of taste and smell, sore throat off and on Sometimes body aches  Protocols used: CORONAVIRUS (COVID-19) DIAGNOSED OR SUSPECTED-A-AH

## 2019-07-25 NOTE — Telephone Encounter (Signed)
Can try to work her in at end of day, or she could probably do a virtual with [employer]

## 2019-07-26 ENCOUNTER — Encounter: Payer: Self-pay | Admitting: Nurse Practitioner

## 2019-07-26 ENCOUNTER — Telehealth (INDEPENDENT_AMBULATORY_CARE_PROVIDER_SITE_OTHER): Payer: BC Managed Care – PPO | Admitting: Nurse Practitioner

## 2019-07-26 DIAGNOSIS — U071 COVID-19: Secondary | ICD-10-CM

## 2019-07-26 MED ORDER — PREDNISONE 20 MG PO TABS: 40.0000 mg | ORAL_TABLET | Freq: Every day | ORAL | 0 refills | Status: AC

## 2019-07-26 MED ORDER — ALBUTEROL SULFATE HFA 108 (90 BASE) MCG/ACT IN AERS: 2.0000 | INHALATION_SPRAY | Freq: Four times a day (QID) | RESPIRATORY_TRACT | 0 refills | Status: DC | PRN

## 2019-07-26 NOTE — Progress Notes (Signed)
There were no vitals taken for this visit.   Subjective:    Patient ID: Martha Henry, female    DOB: 06/06/1965, 54 y.o.   MRN: [id number]  HPI: Martha Henry is a 54 y.o. female  Chief Complaint  Patient presents with  . Covid Exposure    tested positive 07/24/19, states she has a lot of chest pressure, loss of smell and taste as well     . This visit was completed via MyChart due to the restrictions of the COVID-19 pandemic. All issues as above were discussed and addressed. Physical exam was done as above through visual confirmation on MyChart. If it was felt that the patient should be evaluated in the office, they were directed there. The patient verbally consented to this visit. . Location of the patient: home . Location of the provider: home . Those involved with this call:  . Provider: Marnee Guarneri, DNP . CMA: Yvonna Alanis, CMA . Front Desk/Registration: Don Perking  . Time spent on call: 15 minutes with patient face to face via video conference. More than 50% of this time was spent in counseling and coordination of care. 10 minutes total spent in review of patient's record and preparation of their chart.  . I verified patient identity using two factors (patient name and date of birth). Patient consents verbally to being seen via telemedicine visit today.    COVID POSITIVE Tested positive on 07/24/2019, her husband had it initially.  She has loss of taste and smell + has a little diarrhea.  Started Tuesday afternoon with pressure in middle of chest intermittently, feels when she moves her shoulder blades or neck.  Took shower last night and felt mildly "winded" after shower.  She inquired into MAB infusion as her husband had this and it did help.  Reviewed chart with her, last BMI in January 2021 was 28.35.  She has no cardiac history, no immunosuppressive therapy, or diabetes.  Discussed with her that at this time has no qualifiers for MAB  infusion. Fever: no Cough: no Shortness of breath: one episode Wheezing: no Chest pain: no Chest tightness: yes Chest congestion: no Nasal congestion: yes Runny nose: yes Post nasal drip: yes Sneezing: no Sore throat: mild Swollen glands: no Sinus pressure: yes Headache: yes Face pain: no Toothache: no Ear pain: none Ear pressure: at times Eyes red/itching:no Eye drainage/crusting: no  Vomiting: no Rash: no Fatigue: yes Sick contacts: yes Relief with OTC cold/cough medications: no  Treatments attempted: Tylenol & Gasex  Relevant past medical, surgical, family and social history reviewed and updated as indicated. Interim medical history since our last visit reviewed. Allergies and medications reviewed and updated.  Review of Systems  Constitutional: Positive for fatigue. Negative for activity change, appetite change and fever.  HENT: Positive for congestion, postnasal drip, rhinorrhea, sinus pressure and sore throat. Negative for ear discharge, ear pain, facial swelling, sinus pain, sneezing and voice change.   Eyes: Negative for pain and visual disturbance.  Respiratory: Positive for chest tightness (occasional). Negative for cough, shortness of breath and wheezing.   Cardiovascular: Negative for chest pain, palpitations and leg swelling.  Gastrointestinal: Positive for diarrhea (occasional). Negative for abdominal distention, abdominal pain, constipation, nausea and vomiting.  Endocrine: Negative.   Musculoskeletal: Negative for myalgias.  Neurological: Positive for headaches. Negative for dizziness and numbness.  Psychiatric/Behavioral: Negative.     Per HPI unless specifically indicated above     Objective:    There were no vitals taken  for this visit.  Wt Readings from Last 3 Encounters:  06/27/19 160 lb (72.6 kg)  05/21/19 155 lb (70.3 kg)  04/09/19 155 lb (70.3 kg)    Physical Exam Vitals and nursing note reviewed.  Constitutional:      General: She is  awake. She is not in acute distress.    Appearance: She is well-developed. She is not ill-appearing.  HENT:     Head: Normocephalic.     Right Ear: Hearing normal.     Left Ear: Hearing normal.  Eyes:     General: Lids are normal.        Right eye: No discharge.        Left eye: No discharge.     Conjunctiva/sclera: Conjunctivae normal.  Pulmonary:     Effort: Pulmonary effort is normal. No accessory muscle usage or respiratory distress.     Comments: Unable to auscultate due to virtual exam only.  No SOB with talking and no cough present. Musculoskeletal:     Cervical back: Normal range of motion.  Neurological:     Mental Status: She is alert and oriented to person, place, and time.  Psychiatric:        Attention and Perception: Attention normal.        Mood and Affect: Mood normal.        Behavior: Behavior normal. Behavior is cooperative.        Thought Content: Thought content normal.        Judgment: Judgment normal.     Results for orders placed or performed in visit on 06/27/19  VITAMIN D 25 Hydroxy (Vit-D Deficiency, Fractures)  Result Value Ref Range   Vit D, 25-Hydroxy 30.5 30.0 - 100.0 ng/mL  Vitamin B12  Result Value Ref Range   Vitamin B-12 1,251 (H) 232 - 1,245 pg/mL  TSH  Result Value Ref Range   TSH 2.560 0.450 - 4.500 uIU/mL  Sed Rate (ESR)  Result Value Ref Range   Sed Rate 6 0 - 40 mm/hr  Rheumatoid Factor  Result Value Ref Range   Rhuematoid fact SerPl-aCnc <10.0 0.0 - 13.9 IU/mL  ANA w/Reflex  Result Value Ref Range   Anti Nuclear Antibody (ANA) Negative Negative      Assessment & Plan:   Problem List Items Addressed This Visit      Other   Lab test positive for detection of COVID-19 virus    Acute, positive on 07/24/2019.  She is aware of quarantine guidelines, husband is also under quarantine.  At this time she does not qualify for outpatient MAB infusion, this was discussed with her.  Will send in script for Prednisone burst and Albuterol  inhaler to use as needed.  Recommend continue use of OTC cold medications as needed + Tylenol.  Ensure plenty of rest and hydration.  May take Vitamin D and Zinc supplement daily.  Recommend she obtain pulse oximeter and monitor at home, if consistent < 90% alert office or go to ER for further assessment.  Discussed symptoms to be seen for immediately in ER, such as worsening SOB, cough, fever, or CP.  Return to office in 14 days for follow-up.         I discussed the assessment and treatment plan with the patient. The patient was provided an opportunity to ask questions and all were answered. The patient agreed with the plan and demonstrated an understanding of the instructions.   The patient was advised to call back or seek an  in-person evaluation if the symptoms worsen or if the condition fails to improve as anticipated.   I provided 15+ minutes of time during this encounter.  Follow up plan: Return in about 2 weeks (around 08/09/2019) for Covid follow-up.

## 2019-07-26 NOTE — Patient Instructions (Signed)
COVID-19 COVID-19 is a respiratory infection that is caused by a virus called severe acute respiratory syndrome coronavirus 2 (SARS-CoV-2). The disease is also known as coronavirus disease or novel coronavirus. In some people, the virus may not cause any symptoms. In others, it may cause a serious infection. The infection can get worse quickly and can lead to complications, such as:  Pneumonia, or infection of the lungs.  Acute respiratory distress syndrome or ARDS. This is a condition in which fluid build-up in the lungs prevents the lungs from filling with air and passing oxygen into the blood.  Acute respiratory failure. This is a condition in which there is not enough oxygen passing from the lungs to the body or when carbon dioxide is not passing from the lungs out of the body.  Sepsis or septic shock. This is a serious bodily reaction to an infection.  Blood clotting problems.  Secondary infections due to bacteria or fungus.  Organ failure. This is when your body's organs stop working. The virus that causes COVID-19 is contagious. This means that it can spread from person to person through droplets from coughs and sneezes (respiratory secretions). What are the causes? This illness is caused by a virus. You may catch the virus by:  Breathing in droplets from an infected person. Droplets can be spread by a person breathing, speaking, singing, coughing, or sneezing.  Touching something, like a table or a doorknob, that was exposed to the virus (contaminated) and then touching your mouth, nose, or eyes. What increases the risk? Risk for infection You are more likely to be infected with this virus if you:  Are within 6 feet (2 meters) of a person with COVID-19.  Provide care for or live with a person who is infected with COVID-19.  Spend time in crowded indoor spaces or live in shared housing. Risk for serious illness You are more likely to become seriously ill from the virus if you:   Are 50 years of age or older. The higher your age, the more you are at risk for serious illness.  Live in a nursing home or long-term care facility.  Have cancer.  Have a long-term (chronic) disease such as: ? Chronic lung disease, including chronic obstructive pulmonary disease or asthma. ? A long-term disease that lowers your body's ability to fight infection (immunocompromised). ? Heart disease, including heart failure, a condition in which the arteries that lead to the heart become narrow or blocked (coronary artery disease), a disease which makes the heart muscle thick, weak, or stiff (cardiomyopathy). ? Diabetes. ? Chronic kidney disease. ? Sickle cell disease, a condition in which red blood cells have an abnormal "sickle" shape. ? Liver disease.  Are obese. What are the signs or symptoms? Symptoms of this condition can range from mild to severe. Symptoms may appear any time from 2 to 14 days after being exposed to the virus. They include:  A fever or chills.  A cough.  Difficulty breathing.  Headaches, body aches, or muscle aches.  Runny or stuffy (congested) nose.  A sore throat.  New loss of taste or smell. Some people may also have stomach problems, such as nausea, vomiting, or diarrhea. Other people may not have any symptoms of COVID-19. How is this diagnosed? This condition may be diagnosed based on:  Your signs and symptoms, especially if: ? You live in an area with a COVID-19 outbreak. ? You recently traveled to or from an area where the virus is common. ? You   provide care for or live with a person who was diagnosed with COVID-19. ? You were exposed to a person who was diagnosed with COVID-19.  A physical exam.  Lab tests, which may include: ? Taking a sample of fluid from the back of your nose and throat (nasopharyngeal fluid), your nose, or your throat using a swab. ? A sample of mucus from your lungs (sputum). ? Blood tests.  Imaging tests, which  may include, X-rays, CT scan, or ultrasound. How is this treated? At present, there is no medicine to treat COVID-19. Medicines that treat other diseases are being used on a trial basis to see if they are effective against COVID-19. Your health care provider will talk with you about ways to treat your symptoms. For most people, the infection is mild and can be managed at home with rest, fluids, and over-the-counter medicines. Treatment for a serious infection usually takes places in a hospital intensive care unit (ICU). It may include one or more of the following treatments. These treatments are given until your symptoms improve.  Receiving fluids and medicines through an IV.  Supplemental oxygen. Extra oxygen is given through a tube in the nose, a face mask, or a hood.  Positioning you to lie on your stomach (prone position). This makes it easier for oxygen to get into the lungs.  Continuous positive airway pressure (CPAP) or bi-level positive airway pressure (BPAP) machine. This treatment uses mild air pressure to keep the airways open. A tube that is connected to a motor delivers oxygen to the body.  Ventilator. This treatment moves air into and out of the lungs by using a tube that is placed in your windpipe.  Tracheostomy. This is a procedure to create a hole in the neck so that a breathing tube can be inserted.  Extracorporeal membrane oxygenation (ECMO). This procedure gives the lungs a chance to recover by taking over the functions of the heart and lungs. It supplies oxygen to the body and removes carbon dioxide. Follow these instructions at home: Lifestyle  If you are sick, stay home except to get medical care. Your health care provider will tell you how long to stay home. Call your health care provider before you go for medical care.  Rest at home as told by your health care provider.  Do not use any products that contain nicotine or tobacco, such as cigarettes, e-cigarettes, and  chewing tobacco. If you need help quitting, ask your health care provider.  Return to your normal activities as told by your health care provider. Ask your health care provider what activities are safe for you. General instructions  Take over-the-counter and prescription medicines only as told by your health care provider.  Drink enough fluid to keep your urine pale yellow.  Keep all follow-up visits as told by your health care provider. This is important. How is this prevented?  There is no vaccine to help prevent COVID-19 infection. However, there are steps you can take to protect yourself and others from this virus. To protect yourself:   Do not travel to areas where COVID-19 is a risk. The areas where COVID-19 is reported change often. To identify high-risk areas and travel restrictions, check the CDC travel website: wwwnc.cdc.gov/travel/notices  If you live in, or must travel to, an area where COVID-19 is a risk, take precautions to avoid infection. ? Stay away from people who are sick. ? Wash your hands often with soap and water for 20 seconds. If soap and water   are not available, use an alcohol-based hand sanitizer. ? Avoid touching your mouth, face, eyes, or nose. ? Avoid going out in public, follow guidance from your state and local health authorities. ? If you must go out in public, wear a cloth face covering or face mask. Make sure your mask covers your nose and mouth. ? Avoid crowded indoor spaces. Stay at least 6 feet (2 meters) away from others. ? Disinfect objects and surfaces that are frequently touched every day. This may include:  Counters and tables.  Doorknobs and light switches.  Sinks and faucets.  Electronics, such as phones, remote controls, keyboards, computers, and tablets. To protect others: If you have symptoms of COVID-19, take steps to prevent the virus from spreading to others.  If you think you have a COVID-19 infection, contact your health care  provider right away. Tell your health care team that you think you may have a COVID-19 infection.  Stay home. Leave your house only to seek medical care. Do not use public transport.  Do not travel while you are sick.  Wash your hands often with soap and water for 20 seconds. If soap and water are not available, use alcohol-based hand sanitizer.  Stay away from other members of your household. Let healthy household members care for children and pets, if possible. If you have to care for children or pets, wash your hands often and wear a mask. If possible, stay in your own room, separate from others. Use a different bathroom.  Make sure that all people in your household wash their hands well and often.  Cough or sneeze into a tissue or your sleeve or elbow. Do not cough or sneeze into your hand or into the air.  Wear a cloth face covering or face mask. Make sure your mask covers your nose and mouth. Where to find more information  Centers for Disease Control and Prevention: www.cdc.gov/coronavirus/2019-ncov/index.html  World Health Organization: www.who.int/health-topics/coronavirus Contact a health care provider if:  You live in or have traveled to an area where COVID-19 is a risk and you have symptoms of the infection.  You have had contact with someone who has COVID-19 and you have symptoms of the infection. Get help right away if:  You have trouble breathing.  You have pain or pressure in your chest.  You have confusion.  You have bluish lips and fingernails.  You have difficulty waking from sleep.  You have symptoms that get worse. These symptoms may represent a serious problem that is an emergency. Do not wait to see if the symptoms will go away. Get medical help right away. Call your local emergency services (911 in the [place] Do not drive yourself to the hospital. Let the emergency medical personnel know if you think you have COVID-19. Summary  COVID-19 is a  respiratory infection that is caused by a virus. It is also known as coronavirus disease or novel coronavirus. It can cause serious infections, such as pneumonia, acute respiratory distress syndrome, acute respiratory failure, or sepsis.  The virus that causes COVID-19 is contagious. This means that it can spread from person to person through droplets from breathing, speaking, singing, coughing, or sneezing.  You are more likely to develop a serious illness if you are 50 years of age or older, have a weak immune system, live in a nursing home, or have chronic disease.  There is no medicine to treat COVID-19. Your health care provider will talk with you about ways to treat your symptoms.    Take steps to protect yourself and others from infection. Wash your hands often and disinfect objects and surfaces that are frequently touched every day. Stay away from people who are sick and wear a mask if you are sick. This information is not intended to replace advice given to you by your health care provider. Make sure you discuss any questions you have with your health care provider. Document Revised: 02/16/2019 Document Reviewed: 05/25/2018 Elsevier Patient Education  2020 [employer]  

## 2019-07-26 NOTE — Assessment & Plan Note (Signed)
Acute, positive on 07/24/2019.  She is aware of quarantine guidelines, husband is also under quarantine.  At this time she does not qualify for outpatient MAB infusion, this was discussed with her.  Will send in script for Prednisone burst and Albuterol inhaler to use as needed.  Recommend continue use of OTC cold medications as needed + Tylenol.  Ensure plenty of rest and hydration.  May take Vitamin D and Zinc supplement daily.  Recommend she obtain pulse oximeter and monitor at home, if consistent < 90% alert office or go to ER for further assessment.  Discussed symptoms to be seen for immediately in ER, such as worsening SOB, cough, fever, or CP.  Return to office in 14 days for follow-up.

## 2019-07-30 ENCOUNTER — Telehealth: Payer: Self-pay | Admitting: Family Medicine

## 2019-07-30 NOTE — Telephone Encounter (Signed)
Called pt to schedule virtual around 08/09/2019 for Covid follow-up, no answer left vm

## 2019-07-30 NOTE — Telephone Encounter (Signed)
-----   Message from Venita Lick, NP sent at 07/26/2019  9:44 AM EDT ----- 14 days for Covid follow-up virtually with PCP or me

## 2019-08-01 ENCOUNTER — Encounter: Payer: Self-pay | Admitting: Family Medicine

## 2019-08-01 ENCOUNTER — Ambulatory Visit (INDEPENDENT_AMBULATORY_CARE_PROVIDER_SITE_OTHER): Payer: BC Managed Care – PPO | Admitting: Family Medicine

## 2019-08-01 VITALS — Wt 155.0 lb

## 2019-08-01 DIAGNOSIS — U071 COVID-19: Secondary | ICD-10-CM | POA: Diagnosis not present

## 2019-08-01 MED ORDER — BUDESONIDE-FORMOTEROL FUMARATE 160-4.5 MCG/ACT IN AERO: 2.0000 | INHALATION_SPRAY | Freq: Two times a day (BID) | RESPIRATORY_TRACT | 0 refills | Status: DC

## 2019-08-01 MED ORDER — AZITHROMYCIN 250 MG PO TABS: ORAL_TABLET | ORAL | 0 refills | Status: DC

## 2019-08-01 MED ORDER — LEVOTHYROXINE SODIUM 75 MCG PO TABS: 75.0000 ug | ORAL_TABLET | Freq: Every day | ORAL | 0 refills | Status: DC

## 2019-08-01 NOTE — Progress Notes (Signed)
Wt 155 lb (70.3 kg)   BMI 28.35 kg/m    Subjective:    Patient ID: Martha Henry, female    DOB: [birth year], 54 y.o.   MRN: [id number]  HPI: Martha Henry is a 54 y.o. female  Chief Complaint  Patient presents with  . Covid follow up    . This visit was completed via MyChart due to the restrictions of the COVID-19 pandemic. All issues as above were discussed and addressed. Physical exam was done as above through visual confirmation on MyChart. If it was felt that the patient should be evaluated in the office, they were directed there. The patient verbally consented to this visit. . Location of the patient: home . Location of the provider: work . Those involved with this call:  . Provider: Merrie Roof, PA-C . CMA: Lesle Chris, [place] . Front Desk/Registration: Jill Side  . Time spent on call: 15 minutes with patient face to face via video conference. More than 50% of this time was spent in counseling and coordination of care. 5 minutes total spent in review of patient's record and preparation of their chart. I verified patient identity using two factors (patient name and date of birth). Patient consents verbally to being seen via telemedicine visit today.   Tested positive for COVID last week, feeling better than previously but still getting chest pressure and feeling winded doing regular everyday tasks. Completed prednisone, using inhalers regularly. Denies fevers, significant cough, rattling in chest. Has been compliant with home quarantine.   Relevant past medical, surgical, family and social history reviewed and updated as indicated. Interim medical history since our last visit reviewed. Allergies and medications reviewed and updated.  Review of Systems  Per HPI unless specifically indicated above     Objective:    Wt 155 lb (70.3 kg)   BMI 28.35 kg/m   Wt Readings from Last 3 Encounters:  08/01/19 155 lb (70.3 kg)  06/27/19 160 lb (72.6 kg)  05/21/19  155 lb (70.3 kg)    Physical Exam Vitals and nursing note reviewed.  Constitutional:      General: She is not in acute distress.    Appearance: Normal appearance.  HENT:     Head: Atraumatic.     Right Ear: External ear normal.     Left Ear: External ear normal.     Nose: Nose normal. No congestion.     Mouth/Throat:     Mouth: Mucous membranes are moist.     Pharynx: Oropharynx is clear. Posterior oropharyngeal erythema present.  Eyes:     Extraocular Movements: Extraocular movements intact.     Conjunctiva/sclera: Conjunctivae normal.  Cardiovascular:     Comments: Unable to assess via virtual visit Pulmonary:     Effort: Pulmonary effort is normal. No respiratory distress.     Breath sounds: No wheezing.  Musculoskeletal:        General: Normal range of motion.     Cervical back: Normal range of motion.  Skin:    General: Skin is dry.     Findings: No erythema.  Neurological:     Mental Status: She is alert and oriented to person, place, and time.  Psychiatric:        Mood and Affect: Mood normal.        Thought Content: Thought content normal.        Judgment: Judgment normal.     Results for orders placed or performed in visit on 06/27/19  VITAMIN D  25 Hydroxy (Vit-D Deficiency, Fractures)  Result Value Ref Range   Vit D, 25-Hydroxy 30.5 30.0 - 100.0 ng/mL  Vitamin B12  Result Value Ref Range   Vitamin B-12 1,251 (H) 232 - 1,245 pg/mL  TSH  Result Value Ref Range   TSH 2.560 0.450 - 4.500 uIU/mL  Sed Rate (ESR)  Result Value Ref Range   Sed Rate 6 0 - 40 mm/hr  Rheumatoid Factor  Result Value Ref Range   Rhuematoid fact SerPl-aCnc <10.0 0.0 - 13.9 IU/mL  ANA w/Reflex  Result Value Ref Range   Anti Nuclear Antibody (ANA) Negative Negative      Assessment & Plan:   Problem List Items Addressed This Visit    None    Visit Diagnoses    COVID-19 virus infection    -  Primary   Slowly resolving but still with chest tightness and DOE. Start symbicort  until improved, abx sent in case worsening over next few days   Relevant Medications   azithromycin (ZITHROMAX) 250 MG tablet       Follow up plan: Return if symptoms worsen or fail to improve.

## 2019-08-09 DIAGNOSIS — M5416 Radiculopathy, lumbar region: Secondary | ICD-10-CM | POA: Diagnosis not present

## 2019-08-12 ENCOUNTER — Other Ambulatory Visit: Payer: Self-pay | Admitting: Family Medicine

## 2019-08-12 NOTE — Telephone Encounter (Signed)
Requested medication (s) are due for refill today: No  Requested medication (s) are on the active medication list: No- pharmacy requesting Euthyrox   Last refill:  08/01/19  Future visit scheduled: no  Notes to clinic:  Please review request for change of Brand name from levothyroxine to Euthyrox   Requested Prescriptions  Pending Prescriptions Disp Refills   EUTHYROX 75 MCG tablet [Pharmacy Med Name: Euthyrox 75 MCG Oral Tablet] 60 tablet 0    Sig: Take 1 tablet by mouth once daily      Endocrinology:  Hypothyroid Agents Failed - 08/12/2019  7:15 AM      Failed - TSH needs to be rechecked within 3 months after an abnormal result. Refill until TSH is due.      Passed - TSH in normal range and within 360 days    TSH  Date Value Ref Range Status  06/28/2019 2.560 0.450 - 4.500 uIU/mL Final          Passed - Valid encounter within last 12 months    Recent Outpatient Visits           1 week ago COVID-19 virus infection   [place], [place]   2 weeks ago Lab test positive for detection of COVID-19 virus   [facility] [place], [place] T, NP   1 month ago Fatigue, unspecified type   [facility], [place], [place]   2 months ago Acute maxillary sinusitis, recurrence not specified   [place], [place]   5 months ago Hypothyroidism, unspecified type   [facility], [place], [place]

## 2019-08-13 ENCOUNTER — Telehealth: Payer: Self-pay | Admitting: Family Medicine

## 2019-08-13 NOTE — Telephone Encounter (Signed)
LOV: 08/01/2019

## 2019-08-13 NOTE — Telephone Encounter (Signed)
Called pt because she hadn't scheduled virtual covid f/u, pt doesn't want to schedule f/u, but she did ask about getting thyroid levels checked due to medication changes at last appt with Apolonio Schneiders. Pt knows that she has to wait 30 days from being positive to come into office, but she would like to know if she needs to schedule a full appt or just labs. Please advise.

## 2019-08-13 NOTE — Telephone Encounter (Signed)
Labs only for that, thank you!

## 2019-08-13 NOTE — Telephone Encounter (Signed)
Called pt scheduled lab appt for 4/28

## 2019-08-29 ENCOUNTER — Other Ambulatory Visit: Payer: BC Managed Care – PPO

## 2019-08-29 ENCOUNTER — Other Ambulatory Visit: Payer: Self-pay

## 2019-08-29 DIAGNOSIS — R519 Headache, unspecified: Secondary | ICD-10-CM | POA: Diagnosis not present

## 2019-08-29 DIAGNOSIS — M5414 Radiculopathy, thoracic region: Secondary | ICD-10-CM | POA: Diagnosis not present

## 2019-08-29 DIAGNOSIS — M9901 Segmental and somatic dysfunction of cervical region: Secondary | ICD-10-CM | POA: Diagnosis not present

## 2019-08-29 DIAGNOSIS — E039 Hypothyroidism, unspecified: Secondary | ICD-10-CM | POA: Diagnosis not present

## 2019-08-29 DIAGNOSIS — M9902 Segmental and somatic dysfunction of thoracic region: Secondary | ICD-10-CM | POA: Diagnosis not present

## 2019-08-30 LAB — TSH: TSH: 0.401 u[IU]/mL — ABNORMAL LOW (ref 0.450–4.500)

## 2019-09-19 DIAGNOSIS — M9901 Segmental and somatic dysfunction of cervical region: Secondary | ICD-10-CM | POA: Diagnosis not present

## 2019-09-19 DIAGNOSIS — R519 Headache, unspecified: Secondary | ICD-10-CM | POA: Diagnosis not present

## 2019-09-19 DIAGNOSIS — M9902 Segmental and somatic dysfunction of thoracic region: Secondary | ICD-10-CM | POA: Diagnosis not present

## 2019-09-19 DIAGNOSIS — M5414 Radiculopathy, thoracic region: Secondary | ICD-10-CM | POA: Diagnosis not present

## 2019-09-24 ENCOUNTER — Ambulatory Visit: Payer: BC Managed Care – PPO | Admitting: Urology

## 2019-09-24 ENCOUNTER — Other Ambulatory Visit: Payer: Self-pay

## 2019-09-24 VITALS — BP 120/82 | HR 90 | Ht 62.0 in | Wt 155.0 lb

## 2019-09-24 DIAGNOSIS — N3946 Mixed incontinence: Secondary | ICD-10-CM

## 2019-09-24 MED ORDER — MIRABEGRON ER 50 MG PO TB24: 50.0000 mg | ORAL_TABLET | Freq: Every day | ORAL | 3 refills | Status: DC

## 2019-09-24 NOTE — Addendum Note (Signed)
Addended by: Despina Hidden on: 09/24/2019 10:45 AM   Modules accepted: Orders

## 2019-09-24 NOTE — Progress Notes (Signed)
09/24/2019 10:23 AM   Martha Henry 1966-01-24 PH:[id number]  Referring provider: Volney American, PA-C [address] [place],  [place] [address]  Chief Complaint  Patient presents with  . Urinary Incontinence    HPI: I was consulted to assist the patient is urinary incontinence. She has had this for many years and saw another urologist Dr. Jacqlyn Larsen. He has her on imipramine that may or may not be helping.  She can leak with coughing sneezing not bending lifting. She has no urge incontinence. She has no bedwetting. When she finished the shower she can has some urgency and dribble a small amount of urine. She wears 2 or 3 liners per day that are damp.  She voids every 1 hour and cannot hold it for 2 hours. Gets up once or twice at night  She was having some pressure in the lower abdomen but this settled down and she describes her recent negative urinalysis  Mild grade 2 hypermobility the bladder neck and no stress incontinence. Tissue is a bit elastic.Asymptomatic distal grade 1 rectocele  Patient saw Dr. Jacqlyn Larsen for years with mild stress incontinence which she currently has. She does have I believe perhaps some urgency associated leakage in the shower. She does have a frequent bladder voiding every 1 hour with mild nocturia. The role of trying some other medication change urodynamics discussed.   I was not surprised that the patient's not sure if she wants to proceed with urodynamics but at the same time does not want to live with the problem. She took my advice and I will see her back in 6 weeks on Myrbetriq samples and prescription. We can always order the test pending results and goals.  Mild stress incontinence a bit better Much less frequency and not leaking "" all the time small amounts she is very pleased.   Today Frequency is stable Urge incontinence much better.  Still leaks with cough or sneeze but intermittent.  Were not can pursue other therapies.   90x3 sent to pharmacy.  See in a year.  Clinically not infected   PMH: Past Medical History:  Diagnosis Date  . GERD (gastroesophageal reflux disease)   . Headache    sinus  . History of abnormal mammogram    seen by Dr. Jamal Collin  . Hypothyroidism   . ITP (idiopathic thrombocytopenic purpura) 1990   no issues in several years  . Tubular adenoma of colon 2016  . Wears contact lenses     Surgical History: Past Surgical History:  Procedure Laterality Date  . ABDOMINAL HYSTERECTOMY  2003   partial/only ovaries remain  . APPENDECTOMY  2009  . CHOLECYSTECTOMY  2009  . COLONOSCOPY WITH PROPOFOL N/A 02/26/2015   Procedure: COLONOSCOPY WITH PROPOFOL;  Surgeon: Christene Lye, MD;  Location: [facility];  Service: Endoscopy;  Laterality: N/A;  . NASAL TURBINATE REDUCTION Bilateral 05/02/2015   Procedure: TURBINATE REDUCTION/SUBMUCOSAL RESECTION;  Surgeon: Beverly Gust, MD;  Location: [place];  Service: ENT;  Laterality: Bilateral;  . SEPTOPLASTY N/A 05/02/2015   Procedure: SEPTOPLASTY;  Surgeon: Beverly Gust, MD;  Location: [place];  Service: ENT;  Laterality: N/A;  . TONSILLECTOMY  1975    Home Medications:  Allergies as of 09/24/2019      Reactions   Erythromycin Other (See Comments)   Chest pain   Aspirin Other (See Comments)   Platelet problems   Ciprofloxacin Other (See Comments)   constipation      Medication List  Accurate as of Sep 24, 2019 10:23 AM. If you have any questions, ask your nurse or doctor.        STOP taking these medications   albuterol 108 (90 Base) MCG/ACT inhaler Commonly known as: VENTOLIN HFA Stopped by: Reece Packer, MD   azithromycin 250 MG tablet Commonly known as: ZITHROMAX Stopped by: Reece Packer, MD     TAKE these medications   budesonide-formoterol 160-4.5 MCG/ACT inhaler Commonly known as: SYMBICORT Inhale 2 puffs into the lungs 2 (two) times daily.   cetirizine 10 MG  tablet Commonly known as: ZYRTEC Take 10 mg by mouth daily.   Euthyrox 75 MCG tablet Generic drug: levothyroxine Take 1 tablet by mouth once daily   fluticasone 50 MCG/ACT nasal spray Commonly known as: FLONASE Place 2 sprays into both nostrils 2 (two) times daily.   lansoprazole 30 MG capsule Commonly known as: Prevacid Take 1 capsule (30 mg total) by mouth daily.   mirabegron ER 50 MG Tb24 tablet Commonly known as: MYRBETRIQ Take 1 tablet (50 mg total) by mouth daily.   montelukast 10 MG tablet Commonly known as: SINGULAIR Take 1 tablet (10 mg total) by mouth at bedtime.   Turmeric Curcumin 500 MG Caps Take by mouth daily.   Viactiv [id number] MG-UNT-MCG Chew Generic drug: Calcium-Vitamin D-Vitamin K Chew by mouth.   VITAMIN B-12 PO Take by mouth daily.       Allergies:  Allergies  Allergen Reactions  . Erythromycin Other (See Comments)    Chest pain  . Aspirin Other (See Comments)    Platelet problems  . Ciprofloxacin Other (See Comments)    constipation    Family History: Family History  Problem Relation Age of Onset  . Cancer Mother 27       breast  . Breast cancer Mother 10  . Colon cancer Father 83  . Cancer Father        colon  . Breast cancer Maternal Aunt   . Cancer Maternal Aunt        breast  . Ovarian cancer Maternal Aunt        Stage 4  . Lung cancer Maternal Grandfather   . Cervical cancer Maternal Grandmother     Social History:  reports that she has never smoked. She has never used smokeless tobacco. She reports that she does not drink alcohol or use drugs.  ROS:                                        Physical Exam: BP 120/82   Pulse 90   Ht 5\' 2"  (1.575 m)   Wt 155 lb (70.3 kg)   BMI 28.35 kg/m   Constitutional:  Alert and oriented, No acute distress.   Laboratory Data: Lab Results  Component Value Date   WBC 8.4 03/05/2019   HGB 12.7 03/05/2019   HCT 39.1 03/05/2019   MCV 83 03/05/2019    PLT 360 03/05/2019    Lab Results  Component Value Date   CREATININE 0.75 03/05/2019    No results found for: PSA  No results found for: TESTOSTERONE  No results found for: HGBA1C  Urinalysis    Component Value Date/Time   APPEARANCEUR Hazy (A) 04/09/2019 1107   GLUCOSEU Negative 04/09/2019 1107   BILIRUBINUR Negative 04/09/2019 1107   PROTEINUR Negative 04/09/2019 1107   NITRITE Negative 04/09/2019 1107   LEUKOCYTESUR  Negative 04/09/2019 1107    Pertinent Imaging:   Assessment & Plan: Prescription sent.  See in a year  There are no diagnoses linked to this encounter.  No follow-ups on file.  Reece Packer, MD  [facility] [address], [place], [place] [address] [phone]

## 2019-09-25 ENCOUNTER — Other Ambulatory Visit: Payer: Self-pay | Admitting: Family Medicine

## 2019-09-27 ENCOUNTER — Other Ambulatory Visit: Payer: Self-pay | Admitting: Family Medicine

## 2019-09-27 DIAGNOSIS — Z1231 Encounter for screening mammogram for malignant neoplasm of breast: Secondary | ICD-10-CM

## 2019-10-05 DIAGNOSIS — M9902 Segmental and somatic dysfunction of thoracic region: Secondary | ICD-10-CM | POA: Diagnosis not present

## 2019-10-05 DIAGNOSIS — M9901 Segmental and somatic dysfunction of cervical region: Secondary | ICD-10-CM | POA: Diagnosis not present

## 2019-10-05 DIAGNOSIS — M5414 Radiculopathy, thoracic region: Secondary | ICD-10-CM | POA: Diagnosis not present

## 2019-10-05 DIAGNOSIS — R519 Headache, unspecified: Secondary | ICD-10-CM | POA: Diagnosis not present

## 2019-10-10 ENCOUNTER — Telehealth: Payer: BC Managed Care – PPO | Admitting: Family Medicine

## 2019-10-10 DIAGNOSIS — J209 Acute bronchitis, unspecified: Secondary | ICD-10-CM | POA: Diagnosis not present

## 2019-10-10 DIAGNOSIS — Z20822 Contact with and (suspected) exposure to covid-19: Secondary | ICD-10-CM | POA: Diagnosis not present

## 2019-10-10 DIAGNOSIS — J069 Acute upper respiratory infection, unspecified: Secondary | ICD-10-CM | POA: Diagnosis not present

## 2019-10-12 ENCOUNTER — Other Ambulatory Visit: Payer: Self-pay | Admitting: Family Medicine

## 2019-10-23 DIAGNOSIS — M778 Other enthesopathies, not elsewhere classified: Secondary | ICD-10-CM | POA: Diagnosis not present

## 2019-10-23 DIAGNOSIS — M2012 Hallux valgus (acquired), left foot: Secondary | ICD-10-CM | POA: Diagnosis not present

## 2019-10-23 DIAGNOSIS — M79671 Pain in right foot: Secondary | ICD-10-CM | POA: Diagnosis not present

## 2019-10-23 DIAGNOSIS — M2011 Hallux valgus (acquired), right foot: Secondary | ICD-10-CM | POA: Diagnosis not present

## 2019-10-24 ENCOUNTER — Ambulatory Visit
Admission: RE | Admit: 2019-10-24 | Discharge: 2019-10-24 | Disposition: A | Discharge status: Home / Self Care | Payer: BC Managed Care – PPO | Source: Ambulatory Visit | Attending: Family Medicine | Admitting: Family Medicine

## 2019-10-24 ENCOUNTER — Other Ambulatory Visit: Payer: Self-pay

## 2019-10-24 DIAGNOSIS — Z1231 Encounter for screening mammogram for malignant neoplasm of breast: Secondary | ICD-10-CM | POA: Diagnosis not present

## 2019-10-25 ENCOUNTER — Other Ambulatory Visit: Payer: Self-pay | Admitting: Family Medicine

## 2019-10-29 DIAGNOSIS — M9901 Segmental and somatic dysfunction of cervical region: Secondary | ICD-10-CM | POA: Diagnosis not present

## 2019-10-29 DIAGNOSIS — R519 Headache, unspecified: Secondary | ICD-10-CM | POA: Diagnosis not present

## 2019-10-29 DIAGNOSIS — M5414 Radiculopathy, thoracic region: Secondary | ICD-10-CM | POA: Diagnosis not present

## 2019-10-29 DIAGNOSIS — M9902 Segmental and somatic dysfunction of thoracic region: Secondary | ICD-10-CM | POA: Diagnosis not present

## 2019-11-27 ENCOUNTER — Encounter: Payer: Self-pay | Admitting: Family Medicine

## 2019-11-28 ENCOUNTER — Other Ambulatory Visit: Payer: Self-pay | Admitting: Family Medicine

## 2019-11-28 DIAGNOSIS — E039 Hypothyroidism, unspecified: Secondary | ICD-10-CM

## 2019-11-28 MED ORDER — LEVOTHYROXINE SODIUM 50 MCG PO TABS
50.0000 ug | ORAL_TABLET | Freq: Every day | ORAL | 0 refills | Status: DC
Start: 2019-11-28 — End: 2020-04-14

## 2020-01-21 DIAGNOSIS — M9901 Segmental and somatic dysfunction of cervical region: Secondary | ICD-10-CM | POA: Diagnosis not present

## 2020-01-21 DIAGNOSIS — R519 Headache, unspecified: Secondary | ICD-10-CM | POA: Diagnosis not present

## 2020-01-21 DIAGNOSIS — M9902 Segmental and somatic dysfunction of thoracic region: Secondary | ICD-10-CM | POA: Diagnosis not present

## 2020-01-21 DIAGNOSIS — M5414 Radiculopathy, thoracic region: Secondary | ICD-10-CM | POA: Diagnosis not present

## 2020-01-24 ENCOUNTER — Other Ambulatory Visit: Payer: Self-pay

## 2020-01-24 NOTE — Telephone Encounter (Signed)
Routing to provider  

## 2020-01-25 MED ORDER — MONTELUKAST SODIUM 10 MG PO TABS: 10.0000 mg | ORAL_TABLET | Freq: Every day | ORAL | 0 refills | Status: DC

## 2020-01-28 ENCOUNTER — Telehealth: Payer: Self-pay

## 2020-01-28 NOTE — Telephone Encounter (Signed)
Spoke with Patient- she wishes to remain with Dr.Byrnett for colonoscopy, his contact information provided.

## 2020-02-11 DIAGNOSIS — R519 Headache, unspecified: Secondary | ICD-10-CM | POA: Diagnosis not present

## 2020-02-11 DIAGNOSIS — M9901 Segmental and somatic dysfunction of cervical region: Secondary | ICD-10-CM | POA: Diagnosis not present

## 2020-02-11 DIAGNOSIS — M9902 Segmental and somatic dysfunction of thoracic region: Secondary | ICD-10-CM | POA: Diagnosis not present

## 2020-02-11 DIAGNOSIS — M5414 Radiculopathy, thoracic region: Secondary | ICD-10-CM | POA: Diagnosis not present

## 2020-02-21 ENCOUNTER — Other Ambulatory Visit: Payer: Self-pay | Admitting: General Surgery

## 2020-02-21 DIAGNOSIS — Z86010 Personal history of colonic polyps: Secondary | ICD-10-CM | POA: Diagnosis not present

## 2020-02-21 NOTE — Progress Notes (Signed)
Subjective:     Patient ID: Martha Henry is a 54 y.o. female.  HPI  The following portions of the patient's history were reviewed and updated as appropriate.  This a new patient is here today for: office visit. The patient is here today to discuss a colonoscopy. Her last one was completed by Dr. Jamal Collin in 2016. She reports she did not tolerate the prep well. Patient reports nausea and vomiting. Her dad did have a history of colon cancer. The patient reports her bowels move every other day. She denies rectal bleeding and mucus.       Chief Complaint  Patient presents with  . Evaluation for colonoscopy     BP (!) 132/90   Pulse 75   Temp 36.7 C (98.1 F)   Ht 157.5 cm (5\' 2" )   Wt 74.8 kg (165 lb)   SpO2 97%   BMI 30.18 kg/m       Past Medical History:  Diagnosis Date  . GERD (gastroesophageal reflux disease)   . Hypothyroidism   . Idiopathic thrombocytopenic purpura (CMS-HCC) 1990  . Low back pain   . Sinus headache   . Tubular adenoma of colon, unspecified 2016  . Urinary incontinence, mixed           Past Surgical History:  Procedure Laterality Date  . ABDOMINAL HYSTERECTOMY    . APPENDECTOMY  2009  . ASPIRATION CYST BREAST Left 2019  . CHOLECYSTECTOMY  2009  . COLONOSCOPY  02/26/2015  . HYSTERECTOMY  2003   TAH  . nasal turbinate reduction Bilateral 05/02/2015  . SEPTOPLASTY  05/02/2015  . Tonsilectomy  1975              OB History    Gravida  3   Para  2   Term  2   Preterm      AB  1   Living  2     SAB      IAB      Ectopic      Molar      Multiple      Live Births          Obstetric Comments  Age at first period 69 Age of first pregnancy 64         Social History          Socioeconomic History  . Marital status: Married    Spouse name: Not on file  . Number of children: Not on file  . Years of education: Not on file  . Highest education level: Not on file  Occupational  History  . Not on file  Tobacco Use  . Smoking status: Never Smoker  . Smokeless tobacco: Never Used  Vaping Use  . Vaping Use: Never used  Substance and Sexual Activity  . Alcohol use: No  . Drug use: No  . Sexual activity: Yes    Partners: Male    Birth control/protection: None, Surgical  Other Topics Concern  . Not on file  Social History Narrative  . Not on file   Social Determinants of Health   Financial Resource Strain: Not on file  Food Insecurity: Not on file  Transportation Needs: Not on file            Allergies  Allergen Reactions  . Erythromycin Other (See Comments)    Chest pain  . Aspirin Other (See Comments)    Platelet problems  . Ciprofloxacin Other (See Comments)    constipation  Current Medications        Current Outpatient Medications  Medication Sig Dispense Refill  . calcium carbonate-vitamin D3 (CALTRATE 600+D) 600 mg(1,500mg ) -400 unit tablet Take 1 tablet by mouth once daily.    . cetirizine (ZYRTEC) 10 mg capsule Take 10 mg by mouth once daily.    . cholecalciferol (VITAMIN D3) 1,000 unit tablet Take 1,000 Units by mouth once daily.     . cyanocobalamin (VITAMIN B12) 1000 MCG tablet Take 1,000 mcg by mouth once daily.    . fluticasone (FLONASE) 50 mcg/actuation nasal spray by Nasal route.    Marland Kitchen imipramine (TOFRANIL) 25 MG tablet Take 25 mg by mouth once daily.     . lansoprazole (PREVACID) 15 MG DR capsule Take 30 mg by mouth once daily       . levothyroxine 50 mcg Take 1 capsule (50 mcg total) by mouth once daily 90 capsule 3  . montelukast (SINGULAIR) 10 mg tablet Take 10 mg by mouth nightly    . multivitamin with minerals tablet Take 1 tablet by mouth once daily.     Marland Kitchen MYRBETRIQ 50 mg ER tablet Take 50 mg by mouth once daily    . sennosides-docusate (SENOKOT-S) 8.6-50 mg tablet Take by mouth    . turmeric root extract 500 mg Cap Take 1 capsule by mouth once daily       No current  facility-administered medications for this visit.           Family History  Problem Relation Age of Onset  . Breast cancer Mother   . Colon cancer Father   . Colon polyps Father   . Migraines Brother   . Breast cancer Maternal Aunt   . Ovarian cancer Maternal Aunt   . Cervical cancer Maternal Grandmother   . Lung cancer Maternal Grandfather        Review of Systems  Constitutional: Negative for chills and fever.  Respiratory: Negative for cough.        Objective:   Physical Exam Exam conducted with a chaperone present.  Constitutional:      Appearance: Normal appearance.  Cardiovascular:     Rate and Rhythm: Normal rate and regular rhythm.     Pulses: Normal pulses.     Heart sounds: Normal heart sounds.  Pulmonary:     Effort: Pulmonary effort is normal.     Breath sounds: Normal breath sounds.  Musculoskeletal:     Cervical back: Neck supple.  Skin:    General: Skin is warm and dry.  Neurological:     Mental Status: She is alert and oriented to person, place, and time.  Psychiatric:        Mood and Affect: Mood normal.        Behavior: Behavior normal.    Labs and Radiology:   Colonoscopy completed February 26, 2015 showed a 5 mm tubular adenoma in the sigmoid colon.      Assessment:     Candidate for repeat colonoscopy based on family history and personal history of colon polyps.    Plan:     The patient will decide when she would like to proceed and notify the office.  She had significant nausea and vomiting with her prep in 2016.  She was encouraged to get to quarts of different flavor fluids to mix the MiraLAX and and to plan on stretching out the ingestion time from the usual 2 hours to 4+ hours as needed.  She will make use of Reglan  10 mg prior to beginning her prep with a repeat dose in 4 hours if needed.  The patient will check her schedule and call the office back to arrange a date for her colonoscopy at [facility].      Entered by Ledell Noss, CMA, acting as a scribe for Dr. Hervey Ard, MD.   The documentation recorded by the scribe accurately reflects the service I personally performed and the decisions made by me.   Robert Bellow, MD FACS

## 2020-03-03 DIAGNOSIS — M9901 Segmental and somatic dysfunction of cervical region: Secondary | ICD-10-CM | POA: Diagnosis not present

## 2020-03-03 DIAGNOSIS — M5414 Radiculopathy, thoracic region: Secondary | ICD-10-CM | POA: Diagnosis not present

## 2020-03-03 DIAGNOSIS — M9902 Segmental and somatic dysfunction of thoracic region: Secondary | ICD-10-CM | POA: Diagnosis not present

## 2020-03-03 DIAGNOSIS — R519 Headache, unspecified: Secondary | ICD-10-CM | POA: Diagnosis not present

## 2020-03-07 DIAGNOSIS — Z20822 Contact with and (suspected) exposure to covid-19: Secondary | ICD-10-CM | POA: Diagnosis not present

## 2020-03-07 DIAGNOSIS — Z03818 Encounter for observation for suspected exposure to other biological agents ruled out: Secondary | ICD-10-CM | POA: Diagnosis not present

## 2020-03-18 ENCOUNTER — Other Ambulatory Visit: Payer: Self-pay

## 2020-03-18 ENCOUNTER — Ambulatory Visit (INDEPENDENT_AMBULATORY_CARE_PROVIDER_SITE_OTHER): Payer: BC Managed Care – PPO | Admitting: Family Medicine

## 2020-03-18 ENCOUNTER — Encounter: Payer: Self-pay | Admitting: Family Medicine

## 2020-03-18 VITALS — BP 116/80 | HR 71 | Temp 98.8°F | Ht 62.4 in | Wt 165.6 lb

## 2020-03-18 DIAGNOSIS — Z0000 Encounter for general adult medical examination without abnormal findings: Secondary | ICD-10-CM | POA: Diagnosis not present

## 2020-03-18 DIAGNOSIS — Z136 Encounter for screening for cardiovascular disorders: Secondary | ICD-10-CM | POA: Diagnosis not present

## 2020-03-18 DIAGNOSIS — R0789 Other chest pain: Secondary | ICD-10-CM | POA: Diagnosis not present

## 2020-03-18 DIAGNOSIS — E039 Hypothyroidism, unspecified: Secondary | ICD-10-CM

## 2020-03-18 LAB — URINALYSIS, ROUTINE W REFLEX MICROSCOPIC
Bilirubin, UA: NEGATIVE
Glucose, UA: NEGATIVE
Ketones, UA: NEGATIVE
Leukocytes,UA: NEGATIVE
Nitrite, UA: NEGATIVE
Protein,UA: NEGATIVE
Specific Gravity, UA: 1.02 (ref 1.005–1.030)
Urobilinogen, Ur: 0.2 mg/dL (ref 0.2–1.0)
pH, UA: 6 (ref 5.0–7.5)

## 2020-03-18 LAB — MICROSCOPIC EXAMINATION: WBC, UA: NONE SEEN /hpf (ref 0–5)

## 2020-03-18 MED ORDER — MONTELUKAST SODIUM 10 MG PO TABS
10.0000 mg | ORAL_TABLET | Freq: Every day | ORAL | 0 refills | Status: DC
Start: 2020-03-18 — End: 2020-08-21

## 2020-03-18 MED ORDER — BUDESONIDE-FORMOTEROL FUMARATE 160-4.5 MCG/ACT IN AERO
2.0000 | INHALATION_SPRAY | Freq: Two times a day (BID) | RESPIRATORY_TRACT | 3 refills | Status: DC
Start: 2020-03-18 — End: 2020-04-07

## 2020-03-18 MED ORDER — LANSOPRAZOLE 30 MG PO CPDR
30.0000 mg | DELAYED_RELEASE_CAPSULE | Freq: Every day | ORAL | 1 refills | Status: DC
Start: 2020-03-18 — End: 2021-03-08

## 2020-03-18 NOTE — Progress Notes (Signed)
BP 116/80   Pulse 71   Temp 98.8 F (37.1 C) (Oral)   Ht 5' 2.4" (1.585 m)   Wt 165 lb 9.6 oz (75.1 kg)   SpO2 100%   BMI 29.90 kg/m    Subjective:    Patient ID: Martha Henry, female    DOB: Jul 21, 1965, 54 y.o.   MRN: [id number]  HPI: Martha Henry is a 54 y.o. female presenting on 03/18/2020 for comprehensive medical examination. Current medical complaints include:  HYPOTHYROIDISM- has been taking her thyroid 75MWFSu, 50T,Th,Sa Thyroid control status:stable Satisfied with current treatment? yes Medication side effects: no Medication compliance: excellent compliance Recent dose adjustment:no Fatigue: yes Cold intolerance: no Heat intolerance: no Weight gain: no Weight loss: no Constipation: no Diarrhea/loose stools: no Palpitations: no Lower extremity edema: no Anxiety/depressed mood: no  CHEST PAIN- Has been having pressure in her chest- like a boot standing on her chest.  Duration: several weeks Onset: unsure Quality: pressure, crushing Severity: severe Location: upper sternum Radiation: none Episode duration: hours, nearly constant Frequency: intermittent Related to exertion: no Activity when pain started: at random, worse with laying flat or on her R side Trauma: no Anxiety/recent stressors: no Status: stable Treatments attempted:simethicone, tylenol   Current pain status: pain free Shortness of breath: yes Cough: no Nausea: no Diaphoresis: no Heartburn: yes Palpitations: no  Has been feeling "winded" for a few weeks, has been breathing out through her mouth, a bit more short of breath, she notes that she has not been exercising, but she feels like she needs to. Does not come around the same time as the pressure.   Menopausal Symptoms: yes  Depression Screen done today and results listed below:  Depression screen [facility] 2/9 03/05/2019 02/22/2018 11/21/2017  Decreased Interest 0 0 0  Down, Depressed, Hopeless 0 0 0  PHQ - 2 Score 0 0 0    Altered sleeping - 0 1  Tired, decreased energy - 0 1  Change in appetite - 0 1  Feeling bad or failure about yourself  - - 0  Trouble concentrating - 0 1  Moving slowly or fidgety/restless - 0 0  Suicidal thoughts - 0 0  PHQ-9 Score - 0 4  Difficult doing work/chores - - Not difficult at all    Past Medical History:  Past Medical History:  Diagnosis Date  . COVID-19 07/2019  . GERD (gastroesophageal reflux disease)   . Headache    sinus  . History of abnormal mammogram    seen by Dr. Jamal Collin  . Hypothyroidism   . ITP (idiopathic thrombocytopenic purpura) 1990   no issues in several years  . Tubular adenoma of colon 2016  . Wears contact lenses     Surgical History:  Past Surgical History:  Procedure Laterality Date  . ABDOMINAL HYSTERECTOMY  2003   partial/only ovaries remain  . APPENDECTOMY  2009  . BREAST CYST ASPIRATION Left 2019  . CHOLECYSTECTOMY  2009  . COLONOSCOPY WITH PROPOFOL N/A 02/26/2015   Procedure: COLONOSCOPY WITH PROPOFOL;  Surgeon: Christene Lye, MD;  Location: [facility] END[facility];  Service: Endoscopy;  Laterality: N/A;  . NASAL TURBINATE REDUCTION Bilateral 05/02/2015   Procedure: TURBINATE REDUCTION/SUBMUCOSAL RESECTION;  Surgeon: Beverly Gust, MD;  Location: [place];  Service: ENT;  Laterality: Bilateral;  . SEPTOPLASTY N/A 05/02/2015   Procedure: SEPTOPLASTY;  Surgeon: Beverly Gust, MD;  Location: [place];  Service: ENT;  Laterality: N/A;  . TONSILLECTOMY  1975    Medications:  Current  Outpatient Medications on File Prior to Visit  Medication Sig  . Calcium-Vitamin D-Vitamin K (VIACTIV) 160-737-10 MG-UNT-MCG CHEW Chew by mouth.  . cetirizine (ZYRTEC) 10 MG tablet Take 10 mg by mouth daily.  . Cyanocobalamin (VITAMIN B-12 PO) Take by mouth daily.  . fluticasone (FLONASE) 50 MCG/ACT nasal spray Place 2 sprays into both nostrils 2 (two) times daily.  Marland Kitchen levothyroxine (SYNTHROID) 50 MCG tablet Take 1 tablet (50 mcg  total) by mouth daily.  . mirabegron ER (MYRBETRIQ) 50 MG TB24 tablet Take 1 tablet (50 mg total) by mouth daily.  . Multiple Vitamins-Minerals (MULTIVITAMIN WITH MINERALS) tablet Take by mouth.  . Turmeric Curcumin 500 MG CAPS Take by mouth daily.    No current facility-administered medications on file prior to visit.    Allergies:  Allergies  Allergen Reactions  . Erythromycin Other (See Comments)    Chest pain  . Aspirin Other (See Comments)    Platelet problems  . Ciprofloxacin Other (See Comments)    constipation    Social History:  Social History   Socioeconomic History  . Marital status: Married    Spouse name: Not on file  . Number of children: Not on file  . Years of education: Not on file  . Highest education level: Not on file  Occupational History  . Not on file  Tobacco Use  . Smoking status: Never Smoker  . Smokeless tobacco: Never Used  Vaping Use  . Vaping Use: Never used  Substance and Sexual Activity  . Alcohol use: No  . Drug use: No  . Sexual activity: Not on file  Other Topics Concern  . Not on file  Social History Narrative  . Not on file   Social Determinants of Health   Financial Resource Strain:   . Difficulty of Paying Living Expenses: Not on file  Food Insecurity:   . Worried About Charity fundraiser in the Last Year: Not on file  . Ran Out of Food in the Last Year: Not on file  Transportation Needs:   . Lack of Transportation (Medical): Not on file  . Lack of Transportation (Non-Medical): Not on file  Physical Activity:   . Days of Exercise per Week: Not on file  . Minutes of Exercise per Session: Not on file  Stress:   . Feeling of Stress : Not on file  Social Connections:   . Frequency of Communication with Friends and Family: Not on file  . Frequency of Social Gatherings with Friends and Family: Not on file  . Attends Religious Services: Not on file  . Active Member of Clubs or Organizations: Not on file  . Attends English as a second language teacher Meetings: Not on file  . Marital Status: Not on file  Intimate Partner Violence:   . Fear of Current or Ex-Partner: Not on file  . Emotionally Abused: Not on file  . Physically Abused: Not on file  . Sexually Abused: Not on file   Social History   Tobacco Use  Smoking Status Never Smoker  Smokeless Tobacco Never Used   Social History   Substance and Sexual Activity  Alcohol Use No    Family History:  Family History  Problem Relation Age of Onset  . Cancer Mother 59       breast  . Breast cancer Mother 13  . Colon cancer Father 73  . Cancer Father        colon  . Breast cancer Maternal Aunt   . Cancer  Maternal Aunt        breast  . Ovarian cancer Maternal Aunt        Stage 4  . Lung cancer Maternal Grandfather   . Cervical cancer Maternal Grandmother     Past medical history, surgical history, medications, allergies, family history and social history reviewed with patient today and changes made to appropriate areas of the chart.   Review of Systems  Constitutional: Negative.   HENT: Positive for hearing loss. Negative for congestion, ear discharge, ear pain, nosebleeds, sinus pain, sore throat and tinnitus.   Eyes: Negative.   Respiratory: Positive for shortness of breath. Negative for cough, hemoptysis, sputum production, wheezing and stridor.   Cardiovascular: Positive for chest pain and PND. Negative for palpitations, orthopnea, claudication and leg swelling.  Gastrointestinal: Positive for heartburn. Negative for abdominal pain, blood in stool, constipation, diarrhea, melena, nausea and vomiting.  Genitourinary: Negative.   Musculoskeletal: Positive for joint pain and myalgias. Negative for back pain, falls and neck pain.  Skin: Negative.   Neurological: Negative.   Endo/Heme/Allergies: Positive for environmental allergies and polydipsia. Bruises/bleeds easily.  Psychiatric/Behavioral: Negative.     All other ROS negative except what is listed  above and in the HPI.      Objective:    BP 116/80   Pulse 71   Temp 98.8 F (37.1 C) (Oral)   Ht 5' 2.4" (1.585 m)   Wt 165 lb 9.6 oz (75.1 kg)   SpO2 100%   BMI 29.90 kg/m   Wt Readings from Last 3 Encounters:  03/18/20 165 lb 9.6 oz (75.1 kg)  09/24/19 155 lb (70.3 kg)  08/01/19 155 lb (70.3 kg)    Physical Exam Vitals and nursing note reviewed.  Constitutional:      General: She is not in acute distress.    Appearance: Normal appearance. She is not ill-appearing, toxic-appearing or diaphoretic.  HENT:     Head: Normocephalic and atraumatic.     Right Ear: Tympanic membrane, ear canal and external ear normal. There is no impacted cerumen.     Left Ear: Tympanic membrane, ear canal and external ear normal. There is no impacted cerumen.     Nose: Nose normal. No congestion or rhinorrhea.     Mouth/Throat:     Mouth: Mucous membranes are moist.     Pharynx: Oropharynx is clear. No oropharyngeal exudate or posterior oropharyngeal erythema.  Eyes:     General: No scleral icterus.       Right eye: No discharge.        Left eye: No discharge.     Extraocular Movements: Extraocular movements intact.     Conjunctiva/sclera: Conjunctivae normal.     Pupils: Pupils are equal, round, and reactive to light.  Neck:     Vascular: No carotid bruit.  Cardiovascular:     Rate and Rhythm: Normal rate and regular rhythm.     Pulses: Normal pulses.     Heart sounds: No murmur heard.  No friction rub. No gallop.   Pulmonary:     Effort: Pulmonary effort is normal. No respiratory distress.     Breath sounds: Normal breath sounds. No stridor. No wheezing, rhonchi or rales.  Chest:     Chest wall: No tenderness.     Breasts:        Right: Normal. No swelling, bleeding, inverted nipple, mass, nipple discharge, skin change or tenderness.        Left: Normal. No swelling, bleeding, inverted nipple, mass,  nipple discharge, skin change or tenderness.  Abdominal:     General: Abdomen is  flat. Bowel sounds are normal. There is no distension.     Palpations: Abdomen is soft. There is no mass.     Tenderness: There is no abdominal tenderness. There is no right CVA tenderness, left CVA tenderness, guarding or rebound.     Hernia: No hernia is present.  Genitourinary:    Comments: Pelvic exams deferred with shared decision making Musculoskeletal:        General: No swelling, tenderness, deformity or signs of injury.     Cervical back: Normal range of motion and neck supple. No rigidity. No muscular tenderness.     Right lower leg: No edema.     Left lower leg: No edema.  Lymphadenopathy:     Cervical: No cervical adenopathy.  Skin:    General: Skin is warm and dry.     Capillary Refill: Capillary refill takes less than 2 seconds.     Coloration: Skin is not jaundiced or pale.     Findings: No bruising, erythema, lesion or rash.  Neurological:     General: No focal deficit present.     Mental Status: She is alert and oriented to person, place, and time. Mental status is at baseline.     Cranial Nerves: No cranial nerve deficit.     Sensory: No sensory deficit.     Motor: No weakness.     Coordination: Coordination normal.     Gait: Gait normal.     Deep Tendon Reflexes: Reflexes normal.  Psychiatric:        Mood and Affect: Mood normal.        Behavior: Behavior normal.        Thought Content: Thought content normal.        Judgment: Judgment normal.     Results for orders placed or performed in visit on 08/29/19  TSH  Result Value Ref Range   TSH 0.401 (L) 0.450 - 4.500 uIU/mL      Assessment & Plan:   Problem List Items Addressed This Visit      Endocrine   Hypothyroidism    Rechecking labs today. Await results. Treat as needed.        Other Visit Diagnoses    Routine general medical examination at a health care facility    -  Primary   Vaccines up to date/declined. Screening labs checked today. Pap and mammo up to date. Colonoscopy scheduled.  Continue diet and exercise. Call with any concerns.   Relevant Orders   CBC with Differential/Platelet   Comprehensive metabolic panel   Lipid Panel w/o Chol/HDL Ratio   TSH   Urinalysis, Routine w reflex microscopic   Chest pressure       EKG normal today, given length of time of pressure and worse with laying flat will get her into see cardiology. Referral generated today.   Relevant Orders   EKG 12-Lead (Completed)   Ambulatory referral to Cardiology       Follow up plan: Return for Pending cardiology appt.   LABORATORY TESTING:  - Pap smear: up to date  IMMUNIZATIONS:   - Tdap: Tetanus vaccination status reviewed: last tetanus booster within 10 years. - Influenza: Up to date - Pneumovax: Not applicable - COVID: Refused  SCREENING: -Mammogram: Up to date  - Colonoscopy: Scheduled  - Bone Density: Not applicable   PATIENT COUNSELING:   Advised to take 1 mg of folate supplement per  day if capable of pregnancy.   Sexuality: Discussed sexually transmitted diseases, partner selection, use of condoms, avoidance of unintended pregnancy  and contraceptive alternatives.   Advised to avoid cigarette smoking.  I discussed with the patient that most people either abstain from alcohol or drink within safe limits (<=14/week and <=4 drinks/occasion for males, <=7/weeks and <= 3 drinks/occasion for females) and that the risk for alcohol disorders and other health effects rises proportionally with the number of drinks per week and how often a drinker exceeds daily limits.  Discussed cessation/primary prevention of drug use and availability of treatment for abuse.   Diet: Encouraged to adjust caloric intake to maintain  or achieve ideal body weight, to reduce intake of dietary saturated fat and total fat, to limit sodium intake by avoiding high sodium foods and not adding table salt, and to maintain adequate dietary potassium and calcium preferably from fresh fruits, vegetables, and  low-fat dairy products.    stressed the importance of regular exercise  Injury prevention: Discussed safety belts, safety helmets, smoke detector, smoking near bedding or upholstery.   Dental health: Discussed importance of regular tooth brushing, flossing, and dental visits.    NEXT PREVENTATIVE PHYSICAL DUE IN 1 YEAR. Return for Pending cardiology appt.

## 2020-03-18 NOTE — Assessment & Plan Note (Signed)
Rechecking labs today. Await results. Treat as needed.  °

## 2020-03-18 NOTE — Progress Notes (Signed)
Interpreted by me today. NSR at 63bpm with no ST segment changes.

## 2020-03-19 LAB — CBC WITH DIFFERENTIAL/PLATELET
Basophils Absolute: 0.1 10*3/uL (ref 0.0–0.2)
Basos: 1 %
EOS (ABSOLUTE): 0.1 10*3/uL (ref 0.0–0.4)
Eos: 1 %
Hematocrit: 39.1 % (ref 34.0–46.6)
Hemoglobin: 12.6 g/dL (ref 11.1–15.9)
Immature Grans (Abs): 0 10*3/uL (ref 0.0–0.1)
Immature Granulocytes: 0 %
Lymphocytes Absolute: 3.1 10*3/uL (ref 0.7–3.1)
Lymphs: 37 %
MCH: 26.4 pg — ABNORMAL LOW (ref 26.6–33.0)
MCHC: 32.2 g/dL (ref 31.5–35.7)
MCV: 82 fL (ref 79–97)
Monocytes Absolute: 0.7 10*3/uL (ref 0.1–0.9)
Monocytes: 8 %
Neutrophils Absolute: 4.5 10*3/uL (ref 1.4–7.0)
Neutrophils: 53 %
Platelets: 315 10*3/uL (ref 150–450)
RBC: 4.78 x10E6/uL (ref 3.77–5.28)
RDW: 13.4 % (ref 11.7–15.4)
WBC: 8.5 10*3/uL (ref 3.4–10.8)

## 2020-03-19 LAB — COMPREHENSIVE METABOLIC PANEL
ALT: 11 IU/L (ref 0–32)
AST: 19 IU/L (ref 0–40)
Albumin/Globulin Ratio: 2.1 (ref 1.2–2.2)
Albumin: 4.2 g/dL (ref 3.8–4.9)
Alkaline Phosphatase: 77 IU/L (ref 44–121)
BUN/Creatinine Ratio: 13 (ref 9–23)
BUN: 10 mg/dL (ref 6–24)
Bilirubin Total: 0.3 mg/dL (ref 0.0–1.2)
CO2: 22 mmol/L (ref 20–29)
Calcium: 9 mg/dL (ref 8.7–10.2)
Chloride: 105 mmol/L (ref 96–106)
Creatinine, Ser: 0.76 mg/dL (ref 0.57–1.00)
GFR calc Af Amer: 104 mL/min/{1.73_m2} (ref >59)
GFR calc non Af Amer: 90 mL/min/{1.73_m2} (ref >59)
Globulin, Total: 2 g/dL (ref 1.5–4.5)
Glucose: 85 mg/dL (ref 65–99)
Potassium: 3.8 mmol/L (ref 3.5–5.2)
Sodium: 139 mmol/L (ref 134–144)
Total Protein: 6.2 g/dL (ref 6.0–8.5)

## 2020-03-19 LAB — TSH: TSH: 3.01 u[IU]/mL (ref 0.450–4.500)

## 2020-03-19 LAB — LIPID PANEL W/O CHOL/HDL RATIO
Cholesterol, Total: 168 mg/dL (ref 100–199)
HDL: 59 mg/dL (ref >39)
LDL Chol Calc (NIH): 96 mg/dL (ref 0–99)
Triglycerides: 68 mg/dL (ref 0–149)
VLDL Cholesterol Cal: 13 mg/dL (ref 5–40)

## 2020-03-31 DIAGNOSIS — M9901 Segmental and somatic dysfunction of cervical region: Secondary | ICD-10-CM | POA: Diagnosis not present

## 2020-03-31 DIAGNOSIS — M9902 Segmental and somatic dysfunction of thoracic region: Secondary | ICD-10-CM | POA: Diagnosis not present

## 2020-03-31 DIAGNOSIS — M5414 Radiculopathy, thoracic region: Secondary | ICD-10-CM | POA: Diagnosis not present

## 2020-03-31 DIAGNOSIS — R519 Headache, unspecified: Secondary | ICD-10-CM | POA: Diagnosis not present

## 2020-04-07 ENCOUNTER — Ambulatory Visit: Payer: BC Managed Care – PPO | Admitting: Internal Medicine

## 2020-04-07 ENCOUNTER — Encounter: Payer: Self-pay | Admitting: Internal Medicine

## 2020-04-07 ENCOUNTER — Other Ambulatory Visit: Payer: Self-pay

## 2020-04-07 VITALS — BP 126/86 | HR 77 | Ht 62.0 in | Wt 164.0 lb

## 2020-04-07 DIAGNOSIS — M9901 Segmental and somatic dysfunction of cervical region: Secondary | ICD-10-CM | POA: Diagnosis not present

## 2020-04-07 DIAGNOSIS — R079 Chest pain, unspecified: Secondary | ICD-10-CM

## 2020-04-07 DIAGNOSIS — R0602 Shortness of breath: Secondary | ICD-10-CM | POA: Diagnosis not present

## 2020-04-07 DIAGNOSIS — R0789 Other chest pain: Secondary | ICD-10-CM | POA: Diagnosis not present

## 2020-04-07 DIAGNOSIS — M5414 Radiculopathy, thoracic region: Secondary | ICD-10-CM | POA: Diagnosis not present

## 2020-04-07 DIAGNOSIS — M9902 Segmental and somatic dysfunction of thoracic region: Secondary | ICD-10-CM | POA: Diagnosis not present

## 2020-04-07 DIAGNOSIS — R519 Headache, unspecified: Secondary | ICD-10-CM | POA: Diagnosis not present

## 2020-04-07 NOTE — Patient Instructions (Signed)
Medication Instructions:  Your physician recommends that you continue on your current medications as directed. Please refer to the Current Medication list given to you today.  *If you need a refill on your cardiac medications before your next appointment, please call your pharmacy*   Lab Work: COVID PRE- TEST: You will need a COVID TEST prior to the procedure:  LOCATION: [place] [facility].  DATE/TIME:  ___________________ the day before stress test anytime between 8 am and 1 pm.   If you have labs (blood work) drawn today and your tests are completely normal, you will receive your results only by: Marland Kitchen MyChart Message (if you have MyChart) OR . A paper copy in the mail If you have any lab test that is abnormal or we need to change your treatment, we will call you to review the results.   Testing/Procedures:  Your physician has requested that you have an echocardiogram. Echocardiography is a painless test that uses sound waves to create images of your heart. It provides your doctor with information about the size and shape of your heart and how well your heart's chambers and valves are working. This procedure takes approximately one hour. There are no restrictions for this procedure. There is a possibility that an IV may need to be started during your test to inject an image enhancing agent. This is done to obtain more optimal pictures of your heart. Therefore we ask that you do at least drink some water prior to coming in to hydrate your veins.    Your physician has requested that you have an exercise tolerance test. For further information please visit [email or web address]. Please also follow instruction sheet, as given.   DO NOT drink or eat foods with caffeine for 24 hours before the test. (Chocolate, coffee, tea, decaf coffee/tea, or energy drinks)  DO NOT smoke for 4 hours before your test.  If you use an inhaler, bring it with you to the test.  Wear  comfortable shoes and clothing. Women do not wear dresses.    Follow-Up: At [facility], you and your health needs are our priority.  As part of our continuing mission to provide you with exceptional heart care, we have created designated Provider Care Teams.  These Care Teams include your primary Cardiologist (physician) and Advanced Practice Providers (APPs -  Physician Assistants and Nurse Practitioners) who all work together to provide you with the care you need, when you need it.  We recommend signing up for the patient portal called "MyChart".  Sign up information is provided on this After Visit Summary.  MyChart is used to connect with patients for Virtual Visits (Telemedicine).  Patients are able to view lab/test results, encounter notes, upcoming appointments, etc.  Non-urgent messages can be sent to your provider as well.   To learn more about what you can do with MyChart, go to [email or web address].    Your next appointment:   As needed.   The format for your next appointment:   In Person  Provider:   You may see DR Harrell Gave END or one of the following Advanced Practice Providers on your designated Care Team:    Murray Hodgkins, NP  Christell Faith, PA-C  Marrianne Mood, PA-C  Cadence Magazine, [place]  Laurann Montana, NP     Echocardiogram An echocardiogram is a procedure that uses painless sound waves (ultrasound) to produce an image of the heart. Images from an echocardiogram can provide important information about:  Signs of coronary artery  disease (CAD).  Aneurysm detection. An aneurysm is a weak or damaged part of an artery wall that bulges out from the normal force of blood pumping through the body.  Heart size and shape. Changes in the size or shape of the heart can be associated with certain conditions, including heart failure, aneurysm, and CAD.  Heart muscle function.  Heart valve function.  Signs of a past heart attack.  Fluid buildup around the  heart.  Thickening of the heart muscle.  A tumor or infectious growth around the heart valves. Tell a health care provider about:  Any allergies you have.  All medicines you are taking, including vitamins, herbs, eye drops, creams, and over-the-counter medicines.  Any blood disorders you have.  Any surgeries you have had.  Any medical conditions you have.  Whether you are pregnant or may be pregnant. What are the risks? Generally, this is a safe procedure. However, problems may occur, including:  Allergic reaction to dye (contrast) that may be used during the procedure. What happens before the procedure? No specific preparation is needed. You may eat and drink normally. What happens during the procedure?   An IV tube may be inserted into one of your veins.  You may receive contrast through this tube. A contrast is an injection that improves the quality of the pictures from your heart.  A gel will be applied to your chest.  A wand-like tool (transducer) will be moved over your chest. The gel will help to transmit the sound waves from the transducer.  The sound waves will harmlessly bounce off of your heart to allow the heart images to be captured in real-time motion. The images will be recorded on a computer. The procedure may vary among health care providers and hospitals. What happens after the procedure?  You may return to your normal, everyday life, including diet, activities, and medicines, unless your health care provider tells you not to do that. Summary  An echocardiogram is a procedure that uses painless sound waves (ultrasound) to produce an image of the heart.  Images from an echocardiogram can provide important information about the size and shape of your heart, heart muscle function, heart valve function, and fluid buildup around your heart.  You do not need to do anything to prepare before this procedure. You may eat and drink normally.  After the  echocardiogram is completed, you may return to your normal, everyday life, unless your health care provider tells you not to do that. This information is not intended to replace advice given to you by your health care provider. Make sure you discuss any questions you have with your health care provider. Document Revised: 08/10/2018 Document Reviewed: 05/22/2016 Elsevier Patient Education  [place].     Exercise Stress Test An exercise stress test is a test to check how your heart works during exercise. You will need to walk on a treadmill or ride an exercise bike for this test. An electrocardiogram (ECG) will record your heartbeat when you are at rest and when you are exercising. You may have an ultrasound or nuclear test after the exercise test. The test is done to check for coronary artery disease (CAD). It is also done to:  See how well you can exercise.  Watch for high blood pressure during exercise.  Test how well you can exercise after treatment.  Check the blood flow to your arms and legs. If your test result is not normal, more testing may be needed. What  happens before the procedure?  Follow instructions from your doctor about what you cannot eat or drink. ? Do not have any drinks or foods that have caffeine in them for 24 hours before the test, or as told by your doctor. This includes coffee, tea (even decaf tea), sodas, chocolate, and cocoa.  Ask your doctor about changing or stopping your normal medicines. This is important if you: ? Take diabetes medicines. ? Take beta-blocker medicines. ? Wear a nitroglycerin patch.  If you use an inhaler, bring it with you to the test.  Do not put lotions, powders, creams, or oils on your chest before the test.  Wear comfortable shoes and clothing.  Do not use any products that have nicotine or tobacco in them, such as cigarettes and e-cigarettes. Stop using them at least 4 hours before the test. If you need help quitting,  ask your doctor. What happens during the procedure?   Patches (electrodes) will be put on your chest.  Wires will be connected to the patches. The wires will send signals to a machine to record your heartbeat.  Your heart rate will be watched while you are resting and while you are exercising. Your blood pressure will also be watched during the test.  You will walk on a treadmill or use a stationary bike. If you cannot use these, you may be asked to turn a crank with your hands.  The activity will get harder and will raise your heart rate.  You may be asked to breathe into a tube a few times during the test. This measures the gases that you breathe out.  You will be asked how you are feeling throughout the test.  You will exercise until your heart reaches a target heart rate. You will stop early if: ? You feel dizzy. ? You have chest pain. ? You are out of breath. ? Your blood pressure is too high or too low. ? You have an irregular heartbeat. ? You have pain or aching in your arms or legs. The procedure may vary among doctors and hospitals. What happens after the procedure?  Your blood pressure, heart rate, breathing rate, and blood oxygen level will be watched after the test.  You may return to your normal diet and activities as told by your doctor.  It is up to you to get the results of your test. Ask your doctor, or the department that is doing the test, when your results will be ready. Summary  An exercise stress test is a test to check how your heart works during exercise.  This test is done to check for coronary artery disease.  Your heart rate will be watched while you are resting and while you are exercising.  Follow instructions from your doctor about what you cannot eat or drink before the test. This information is not intended to replace advice given to you by your health care provider. Make sure you discuss any questions you have with your health care  provider. Document Revised: 08/01/2018 Document Reviewed: 07/20/2016 Elsevier Patient Education  [place].

## 2020-04-07 NOTE — Progress Notes (Signed)
New Outpatient Visit Date: 04/07/2020  Referring Provider: Park Liter, DO [place] [address]  Chief Complaint: Chest pain  HPI:  [name] is a 54 y.o. female who is being seen today for the evaluation of chest pain at the request of Dr. Wynetta Emery. She has a history of hypothyroidism, COVID-19 (07/2019), ITP, allergies, and GERD.  [name] reports intermittent chest discomfort for several years that she describes as gas pain, though often it feels like a boot is pressing on her chest where there is a knife stabbing her in the shoulder.  This seemed to worsen after she contracted COVID-19 in 07/2019.  At that time, she felt pressure on her chest continuously for 2 weeks.  She was placed on inhalers, antibiotics, and steroids with resolution of the constant pain.  However, it has recurred sporadically since then, typically lasting hours to days.  Has improved at times with Gas-X.  [name] feels a little more short of breath when the pain is going on, though she also notes transient dyspnea at other times when she is not experiencing chest discomfort.  [name] denies a history of prior heart disease and testing.  She has not had any palpitations, lightheadedness, orthopnea, PND, or edema.  --------------------------------------------------------------------------------------------------  Cardiovascular History & Procedures: Cardiovascular Problems:  Chest pain  Risk Factors:  None  Cath/PCI:  None  CV Surgery:  None  EP Procedures and Devices:  None  Non-Invasive Evaluation(s):  None  Recent CV Pertinent Labs: Lab Results  Component Value Date   CHOL 168 03/18/2020   HDL 59 03/18/2020   LDLCALC 96 03/18/2020   TRIG 68 03/18/2020   K 3.8 03/18/2020   BUN 10 03/18/2020   CREATININE 0.76 03/18/2020    --------------------------------------------------------------------------------------------------  Past Medical History:  Diagnosis Date  .  COVID-19 07/2019  . GERD (gastroesophageal reflux disease)   . Headache    sinus  . History of abnormal mammogram    seen by Dr. Jamal Collin  . Hypothyroidism   . ITP (idiopathic thrombocytopenic purpura) 1990   no issues in several years  . Tubular adenoma of colon 2016  . Wears contact lenses     Past Surgical History:  Procedure Laterality Date  . ABDOMINAL HYSTERECTOMY  2003   partial/only ovaries remain  . APPENDECTOMY  2009  . BREAST CYST ASPIRATION Left 2019  . CHOLECYSTECTOMY  2009  . COLONOSCOPY WITH PROPOFOL N/A 02/26/2015   Procedure: COLONOSCOPY WITH PROPOFOL;  Surgeon: Christene Lye, MD;  Location: [facility] ENDOSCOPY;  Service: Endoscopy;  Laterality: N/A;  . NASAL TURBINATE REDUCTION Bilateral 05/02/2015   Procedure: TURBINATE REDUCTION/SUBMUCOSAL RESECTION;  Surgeon: Beverly Gust, MD;  Location: [place];  Service: ENT;  Laterality: Bilateral;  . SEPTOPLASTY N/A 05/02/2015   Procedure: SEPTOPLASTY;  Surgeon: Beverly Gust, MD;  Location: [place];  Service: ENT;  Laterality: N/A;  . TONSILLECTOMY  1975    Current Meds  Medication Sig  . Calcium-Vitamin D-Vitamin K (VIACTIV) 678-938-10 MG-UNT-MCG CHEW Chew by mouth.  . cetirizine (ZYRTEC) 10 MG tablet Take 10 mg by mouth daily.  . Cyanocobalamin (VITAMIN B-12 PO) Take by mouth daily.  . fluticasone (FLONASE) 50 MCG/ACT nasal spray Place 2 sprays into both nostrils 2 (two) times daily.  . lansoprazole (PREVACID) 30 MG capsule Take 1 capsule (30 mg total) by mouth daily.  Marland Kitchen levothyroxine (SYNTHROID) 50 MCG tablet Take 1 tablet (50 mcg total) by mouth daily.  Marland Kitchen levothyroxine (SYNTHROID) 75 MCG  tablet Take by mouth.  . mirabegron ER (MYRBETRIQ) 50 MG TB24 tablet Take 1 tablet (50 mg total) by mouth daily.  . montelukast (SINGULAIR) 10 MG tablet Take 1 tablet (10 mg total) by mouth at bedtime.  . Multiple Vitamins-Minerals (MULTIVITAMIN WITH MINERALS) tablet Take by mouth.  . Turmeric  Curcumin 500 MG CAPS Take by mouth daily.     Allergies: Erythromycin, Aspirin, and Ciprofloxacin  Social History   Tobacco Use  . Smoking status: Never Smoker  . Smokeless tobacco: Never Used  Vaping Use  . Vaping Use: Never used  Substance Use Topics  . Alcohol use: No  . Drug use: No    Family History  Problem Relation Age of Onset  . Cancer Mother 60       breast  . Breast cancer Mother 71  . Colon cancer Father 67  . Cancer Father        colon  . Breast cancer Maternal Aunt   . Cancer Maternal Aunt        breast  . Ovarian cancer Maternal Aunt        Stage 4  . Lung cancer Maternal Grandfather   . Cervical cancer Maternal Grandmother     Review of Systems: A 12-system review of systems was performed and was negative except as noted in the HPI.  --------------------------------------------------------------------------------------------------  Physical Exam: BP 126/86 (BP Location: Right Arm, Patient Position: Sitting, Cuff Size: Normal)   Pulse 77   Ht 5\' 2"  (1.575 m)   Wt 164 lb (74.4 kg)   SpO2 97%   BMI 30.00 kg/m   General: NAD. HEENT: No conjunctival pallor or scleral icterus. Facemask in place. Neck: Supple without lymphadenopathy, thyromegaly, JVD, or HJR. No carotid bruit. Lungs: Normal work of breathing. Clear to auscultation bilaterally without wheezes or crackles. Heart: Regular rate and rhythm without murmurs, rubs, or gallops. Non-displaced PMI. Abd: Bowel sounds present. Soft, NT/ND without hepatosplenomegaly Ext: No lower extremity edema. Radial, PT, and DP pulses are 2+ bilaterally Skin: Warm and dry without rash. Neuro: CNIII-XII intact. Strength and fine-touch sensation intact in upper and lower extremities bilaterally. Psych: Normal mood and affect.  EKG: Normal sinus rhythm without abnormality.  Lab Results  Component Value Date   WBC 8.5 03/18/2020   HGB 12.6 03/18/2020   HCT 39.1 03/18/2020   MCV 82 03/18/2020   PLT 315  03/18/2020    Lab Results  Component Value Date   NA 139 03/18/2020   K 3.8 03/18/2020   CL 105 03/18/2020   CO2 22 03/18/2020   BUN 10 03/18/2020   CREATININE 0.76 03/18/2020   GLUCOSE 85 03/18/2020   ALT 11 03/18/2020    Lab Results  Component Value Date   CHOL 168 03/18/2020   HDL 59 03/18/2020   LDLCALC 96 03/18/2020   TRIG 68 03/18/2020     --------------------------------------------------------------------------------------------------  ASSESSMENT AND PLAN: Atypical chest pain and shortness of breath: Pain has been present for years but seem to worsen after COVID-19 infection in 07/2017.  [name] continues to have sporadic chest pain with different qualities as well as occasional shortness of breath.  Physical examination and EKG today are normal.  She does not have any significant cardiac risk factors.  We have discussed further evaluation options and have agreed to obtain a transthoracic echocardiogram and exercise tolerance test.  I will defer adding aspirin, given the low pretest probability and history of ITP.  If echo and stress test are unrevealing, GI  evaluation may be worthwhile for further assessment of chest pain.  Follow-up: Return to clinic as needed based on symptoms and results of aforementioned testing.  Nelva Bush, MD 04/08/2020 6:34 AM

## 2020-04-08 ENCOUNTER — Encounter: Payer: Self-pay | Admitting: Internal Medicine

## 2020-04-08 DIAGNOSIS — R0602 Shortness of breath: Secondary | ICD-10-CM | POA: Insufficient documentation

## 2020-04-08 DIAGNOSIS — R0789 Other chest pain: Secondary | ICD-10-CM | POA: Insufficient documentation

## 2020-04-14 ENCOUNTER — Other Ambulatory Visit: Payer: Self-pay | Admitting: Family Medicine

## 2020-04-29 DIAGNOSIS — M9902 Segmental and somatic dysfunction of thoracic region: Secondary | ICD-10-CM | POA: Diagnosis not present

## 2020-04-29 DIAGNOSIS — M9901 Segmental and somatic dysfunction of cervical region: Secondary | ICD-10-CM | POA: Diagnosis not present

## 2020-04-29 DIAGNOSIS — M5414 Radiculopathy, thoracic region: Secondary | ICD-10-CM | POA: Diagnosis not present

## 2020-04-29 DIAGNOSIS — R519 Headache, unspecified: Secondary | ICD-10-CM | POA: Diagnosis not present

## 2020-05-07 ENCOUNTER — Ambulatory Visit (INDEPENDENT_AMBULATORY_CARE_PROVIDER_SITE_OTHER): Payer: BC Managed Care – PPO

## 2020-05-07 ENCOUNTER — Other Ambulatory Visit: Payer: Self-pay

## 2020-05-07 DIAGNOSIS — R0602 Shortness of breath: Secondary | ICD-10-CM

## 2020-05-07 DIAGNOSIS — R0789 Other chest pain: Secondary | ICD-10-CM

## 2020-05-07 LAB — ECHOCARDIOGRAM COMPLETE
AR max vel: 2.22 cm2
AV Area VTI: 2.17 cm2
AV Area mean vel: 2.05 cm2
AV Mean grad: 2 mmHg
AV Peak grad: 4.6 mmHg
Ao pk vel: 1.07 m/s
Area-P 1/2: 4.65 cm2
Calc EF: 54.4 %
S' Lateral: 3.1 cm
Single Plane A2C EF: 55.1 %
Single Plane A4C EF: 52.6 %

## 2020-05-08 ENCOUNTER — Telehealth: Payer: Self-pay | Admitting: *Deleted

## 2020-05-08 NOTE — Telephone Encounter (Signed)
-----   Message from Yvonne Kendall, MD sent at 05/08/2020  2:17 PM EST ----- Please let Ms. Fairbank know that her echocardiogram does not show any significant abnormalities.  I encourage her to proceed with exercise tolerance test, as we discussed at our office visit last month.  We will f/u with her after completion of the stress test.

## 2020-05-08 NOTE — Telephone Encounter (Signed)
Reviewed results and recommendations with patient. She states that she wanted to wait until after her upcoming colonoscopy but reviewed that in some cases they require clearance if any cardiac concerns. Advised she may want to check with her provider to see if they will do this without the completed cardiac testing. She also requested to schedule this test and reviewed that I would have scheduling review and then we can call her with that information. She verbalized understanding with no further questions at this time.

## 2020-05-12 ENCOUNTER — Other Ambulatory Visit: Payer: BC Managed Care – PPO

## 2020-05-14 ENCOUNTER — Other Ambulatory Visit
Admission: RE | Admit: 2020-05-14 | Discharge: 2020-05-14 | Disposition: A | Discharge status: Home / Self Care | Payer: BC Managed Care – PPO | Source: Ambulatory Visit | Attending: Internal Medicine | Admitting: Internal Medicine

## 2020-05-14 ENCOUNTER — Ambulatory Visit: Admission: RE | Admit: 2020-05-14 | Payer: BC Managed Care – PPO | Source: Home / Self Care | Admitting: General Surgery

## 2020-05-14 ENCOUNTER — Encounter: Admission: RE | Payer: Self-pay | Source: Home / Self Care

## 2020-05-14 DIAGNOSIS — Z20822 Contact with and (suspected) exposure to covid-19: Secondary | ICD-10-CM | POA: Insufficient documentation

## 2020-05-14 DIAGNOSIS — Z01812 Encounter for preprocedural laboratory examination: Secondary | ICD-10-CM | POA: Insufficient documentation

## 2020-05-14 SURGERY — COLONOSCOPY WITH PROPOFOL
Anesthesia: General

## 2020-05-15 LAB — SARS CORONAVIRUS 2 (TAT 6-24 HRS): SARS Coronavirus 2: NEGATIVE

## 2020-05-16 ENCOUNTER — Ambulatory Visit (INDEPENDENT_AMBULATORY_CARE_PROVIDER_SITE_OTHER): Payer: BC Managed Care – PPO

## 2020-05-16 ENCOUNTER — Other Ambulatory Visit: Payer: Self-pay

## 2020-05-16 DIAGNOSIS — R0602 Shortness of breath: Secondary | ICD-10-CM | POA: Diagnosis not present

## 2020-05-16 DIAGNOSIS — R0789 Other chest pain: Secondary | ICD-10-CM

## 2020-05-20 LAB — EXERCISE TOLERANCE TEST
Estimated workload: 7.3 METS
Exercise duration (min): 6 min
Exercise duration (sec): 14 s
MPHR: 168 {beats}/min
Peak HR: 153 {beats}/min
Percent HR: 91 %
RPE: 14
Rest HR: 90 {beats}/min

## 2020-05-22 ENCOUNTER — Telehealth: Payer: Self-pay | Admitting: Internal Medicine

## 2020-05-22 NOTE — Telephone Encounter (Signed)
° °  [facility] Pre-operative Risk Assessment    HEARTCARE STAFF: - Please ensure there is not already an duplicate clearance open for this procedure. - Under Visit Info/Reason for Call, type in Other and utilize the format Clearance MM/DD/YY or Clearance TBD. Do not use dashes or single digits. - If request is for dental extraction, please clarify the # of teeth to be extracted.  Request for surgical clearance:  1. What type of surgery is being performed? Colonoscopy   2. When is this surgery scheduled? tbd   3. What type of clearance is required (medical clearance vs. Pharmacy clearance to hold med vs. Both)? medical  4. Are there any medications that need to be held prior to surgery and how long?not noted   5. Practice name and name of physician performing surgery? [facility] Dr. Marlyn Corporal   6. What is the office phone number? [phone]   7.   What is the office fax number? [phone]  8.   Anesthesia type (None, local, MAC, general) ? Not noted    Clarisse Gouge 05/22/2020, 12:20 PM  _________________________________________________________________   (provider comments below)

## 2020-05-23 NOTE — Telephone Encounter (Signed)
   Primary Cardiologist: Nelva Bush, MD  Chart reviewed as part of pre-operative protocol coverage. Given past medical history and time since last visit, based on ACC/AHA guidelines, Martha Henry would be at acceptable risk for the planned procedure without further cardiovascular testing.   I will route this recommendation to the requesting party via Epic fax function and remove from pre-op pool.  Please call with questions.  Jossie Ng. Anibal Quinby NP-C    05/23/2020, 10:02 AM [place] [address] Office [phone] Fax [phone]

## 2020-05-26 DIAGNOSIS — M5414 Radiculopathy, thoracic region: Secondary | ICD-10-CM | POA: Diagnosis not present

## 2020-05-26 DIAGNOSIS — M9902 Segmental and somatic dysfunction of thoracic region: Secondary | ICD-10-CM | POA: Diagnosis not present

## 2020-05-26 DIAGNOSIS — R519 Headache, unspecified: Secondary | ICD-10-CM | POA: Diagnosis not present

## 2020-05-26 DIAGNOSIS — M9901 Segmental and somatic dysfunction of cervical region: Secondary | ICD-10-CM | POA: Diagnosis not present

## 2020-05-27 ENCOUNTER — Other Ambulatory Visit: Payer: Self-pay | Admitting: General Surgery

## 2020-05-27 NOTE — Progress Notes (Signed)
Subjective:     Patient ID: Martha Henry is a 55 y.o. female.  HPI  The following portions of the patient's history were reviewed and updated as appropriate.  This a new patient is here today for: office visit. The patient is here today to discuss a colonoscopy. Her last one was completed by Dr. Sankar in 2016. She reports she did not tolerate the prep well. Patient reports nausea and vomiting. Her dad did have a history of colon cancer. The patient reports her bowels move every other day. She denies rectal bleeding and mucus.       Chief Complaint  Patient presents with  . Evaluation for colonoscopy     BP (!) 132/90   Pulse 75   Temp 36.7 C (98.1 F)   Ht 157.5 cm (5' 2")   Wt 74.8 kg (165 lb)   SpO2 97%   BMI 30.18 kg/m       Past Medical History:  Diagnosis Date  . GERD (gastroesophageal reflux disease)   . Hypothyroidism   . Idiopathic thrombocytopenic purpura (CMS-HCC) 1990  . Low back pain   . Sinus headache   . Tubular adenoma of colon, unspecified 2016  . Urinary incontinence, mixed           Past Surgical History:  Procedure Laterality Date  . ABDOMINAL HYSTERECTOMY    . APPENDECTOMY  2009  . ASPIRATION CYST BREAST Left 2019  . CHOLECYSTECTOMY  2009  . COLONOSCOPY  02/26/2015  . HYSTERECTOMY  2003   TAH  . nasal turbinate reduction Bilateral 05/02/2015  . SEPTOPLASTY  05/02/2015  . Tonsilectomy  1975              OB History    Gravida  3   Para  2   Term  2   Preterm      AB  1   Living  2     SAB      IAB      Ectopic      Molar      Multiple      Live Births          Obstetric Comments  Age at first period 13 Age of first pregnancy 26         Social History          Socioeconomic History  . Marital status: Married    Spouse name: Not on file  . Number of children: Not on file  . Years of education: Not on file  . Highest education level: Not on file  Occupational  History  . Not on file  Tobacco Use  . Smoking status: Never Smoker  . Smokeless tobacco: Never Used  Vaping Use  . Vaping Use: Never used  Substance and Sexual Activity  . Alcohol use: No  . Drug use: No  . Sexual activity: Yes    Partners: Male    Birth control/protection: None, Surgical  Other Topics Concern  . Not on file  Social History Narrative  . Not on file   Social Determinants of Health   Financial Resource Strain: Not on file  Food Insecurity: Not on file  Transportation Needs: Not on file            Allergies  Allergen Reactions  . Erythromycin Other (See Comments)    Chest pain  . Aspirin Other (See Comments)    Platelet problems  . Ciprofloxacin Other (See Comments)    constipation      Current Medications        Current Outpatient Medications  Medication Sig Dispense Refill  . calcium carbonate-vitamin D3 (CALTRATE 600+D) 600 mg(1,500mg) -400 unit tablet Take 1 tablet by mouth once daily.    . cetirizine (ZYRTEC) 10 mg capsule Take 10 mg by mouth once daily.    . cholecalciferol (VITAMIN D3) 1,000 unit tablet Take 1,000 Units by mouth once daily.     . cyanocobalamin (VITAMIN B12) 1000 MCG tablet Take 1,000 mcg by mouth once daily.    . fluticasone (FLONASE) 50 mcg/actuation nasal spray by Nasal route.    . imipramine (TOFRANIL) 25 MG tablet Take 25 mg by mouth once daily.     . lansoprazole (PREVACID) 15 MG DR capsule Take 30 mg by mouth once daily       . levothyroxine 50 mcg Take 1 capsule (50 mcg total) by mouth once daily 90 capsule 3  . montelukast (SINGULAIR) 10 mg tablet Take 10 mg by mouth nightly    . multivitamin with minerals tablet Take 1 tablet by mouth once daily.     . MYRBETRIQ 50 mg ER tablet Take 50 mg by mouth once daily    . sennosides-docusate (SENOKOT-S) 8.6-50 mg tablet Take by mouth    . turmeric root extract 500 mg Cap Take 1 capsule by mouth once daily       No current  facility-administered medications for this visit.           Family History  Problem Relation Age of Onset  . Breast cancer Mother   . Colon cancer Father   . Colon polyps Father   . Migraines Brother   . Breast cancer Maternal Aunt   . Ovarian cancer Maternal Aunt   . Cervical cancer Maternal Grandmother   . Lung cancer Maternal Grandfather        Review of Systems  Constitutional: Negative for chills and fever.  Respiratory: Negative for cough.        Objective:   Physical Exam Exam conducted with a chaperone present.  Constitutional:      Appearance: Normal appearance.  Cardiovascular:     Rate and Rhythm: Normal rate and regular rhythm.     Pulses: Normal pulses.     Heart sounds: Normal heart sounds.  Pulmonary:     Effort: Pulmonary effort is normal.     Breath sounds: Normal breath sounds.  Musculoskeletal:     Cervical back: Neck supple.  Skin:    General: Skin is warm and dry.  Neurological:     Mental Status: She is alert and oriented to person, place, and time.  Psychiatric:        Mood and Affect: Mood normal.        Behavior: Behavior normal.    Labs and Radiology:   Colonoscopy completed February 26, 2015 showed a 5 mm tubular adenoma in the sigmoid colon.      Assessment:     Candidate for repeat colonoscopy based on family history and personal history of colon polyps.    Plan:     The patient will decide when she would like to proceed and notify the office.  She had significant nausea and vomiting with her prep in 2016.  She was encouraged to get to quarts of different flavor fluids to mix the MiraLAX and and to plan on stretching out the ingestion time from the usual 2 hours to 4+ hours as needed.  She will make use of Reglan   10 mg prior to beginning her prep with a repeat dose in 4 hours if needed.  The patient will check her schedule and call the office back to arrange a date for her colonoscopy at [facility].      Entered by Michele Bailey, CMA, acting as a scribe for Dr. Jeffrey Byrnett, MD.   The documentation recorded by the scribe accurately reflects the service I personally performed and the decisions made by me.   Jeffrey W. Byrnett, MD FACS  

## 2020-06-04 ENCOUNTER — Other Ambulatory Visit: Payer: BC Managed Care – PPO

## 2020-06-10 DIAGNOSIS — J0110 Acute frontal sinusitis, unspecified: Secondary | ICD-10-CM | POA: Diagnosis not present

## 2020-06-10 DIAGNOSIS — Z20822 Contact with and (suspected) exposure to covid-19: Secondary | ICD-10-CM | POA: Diagnosis not present

## 2020-06-11 ENCOUNTER — Other Ambulatory Visit
Admission: RE | Admit: 2020-06-11 | Discharge: 2020-06-11 | Disposition: A | Discharge status: Home / Self Care | Payer: BC Managed Care – PPO | Source: Ambulatory Visit | Attending: General Surgery | Admitting: General Surgery

## 2020-06-11 ENCOUNTER — Other Ambulatory Visit: Payer: Self-pay

## 2020-06-11 DIAGNOSIS — Z90711 Acquired absence of uterus with remaining cervical stump: Secondary | ICD-10-CM | POA: Diagnosis not present

## 2020-06-11 DIAGNOSIS — Z9049 Acquired absence of other specified parts of digestive tract: Secondary | ICD-10-CM | POA: Diagnosis not present

## 2020-06-11 DIAGNOSIS — Z01812 Encounter for preprocedural laboratory examination: Secondary | ICD-10-CM | POA: Insufficient documentation

## 2020-06-11 DIAGNOSIS — Z79899 Other long term (current) drug therapy: Secondary | ICD-10-CM | POA: Diagnosis not present

## 2020-06-11 DIAGNOSIS — Z1211 Encounter for screening for malignant neoplasm of colon: Secondary | ICD-10-CM | POA: Diagnosis not present

## 2020-06-11 DIAGNOSIS — Z886 Allergy status to analgesic agent status: Secondary | ICD-10-CM | POA: Diagnosis not present

## 2020-06-11 DIAGNOSIS — Z8616 Personal history of COVID-19: Secondary | ICD-10-CM | POA: Diagnosis not present

## 2020-06-11 DIAGNOSIS — Z86010 Personal history of colonic polyps: Secondary | ICD-10-CM | POA: Diagnosis not present

## 2020-06-11 DIAGNOSIS — Z79890 Hormone replacement therapy (postmenopausal): Secondary | ICD-10-CM | POA: Diagnosis not present

## 2020-06-11 DIAGNOSIS — Z20822 Contact with and (suspected) exposure to covid-19: Secondary | ICD-10-CM | POA: Insufficient documentation

## 2020-06-11 DIAGNOSIS — Z881 Allergy status to other antibiotic agents status: Secondary | ICD-10-CM | POA: Diagnosis not present

## 2020-06-11 LAB — SARS CORONAVIRUS 2 (TAT 6-24 HRS): SARS Coronavirus 2: NEGATIVE

## 2020-06-12 ENCOUNTER — Encounter: Payer: Self-pay | Admitting: General Surgery

## 2020-06-13 ENCOUNTER — Ambulatory Visit: Payer: BC Managed Care – PPO | Admitting: Anesthesiology

## 2020-06-13 ENCOUNTER — Ambulatory Visit
Admission: RE | Admit: 2020-06-13 | Discharge: 2020-06-13 | Disposition: A | Discharge status: Home / Self Care | Payer: BC Managed Care – PPO | Attending: General Surgery | Admitting: General Surgery

## 2020-06-13 ENCOUNTER — Other Ambulatory Visit: Payer: Self-pay

## 2020-06-13 ENCOUNTER — Encounter
Admission: RE | Disposition: A | Discharge status: Home / Self Care | Payer: Self-pay | Source: Home / Self Care | Attending: General Surgery

## 2020-06-13 ENCOUNTER — Encounter: Payer: Self-pay | Admitting: General Surgery

## 2020-06-13 DIAGNOSIS — Z79890 Hormone replacement therapy (postmenopausal): Secondary | ICD-10-CM | POA: Insufficient documentation

## 2020-06-13 DIAGNOSIS — Z1211 Encounter for screening for malignant neoplasm of colon: Secondary | ICD-10-CM | POA: Diagnosis not present

## 2020-06-13 DIAGNOSIS — Z8616 Personal history of COVID-19: Secondary | ICD-10-CM | POA: Insufficient documentation

## 2020-06-13 DIAGNOSIS — Z9049 Acquired absence of other specified parts of digestive tract: Secondary | ICD-10-CM | POA: Diagnosis not present

## 2020-06-13 DIAGNOSIS — Z881 Allergy status to other antibiotic agents status: Secondary | ICD-10-CM | POA: Insufficient documentation

## 2020-06-13 DIAGNOSIS — Z886 Allergy status to analgesic agent status: Secondary | ICD-10-CM | POA: Insufficient documentation

## 2020-06-13 DIAGNOSIS — Z79899 Other long term (current) drug therapy: Secondary | ICD-10-CM | POA: Diagnosis not present

## 2020-06-13 DIAGNOSIS — Z20822 Contact with and (suspected) exposure to covid-19: Secondary | ICD-10-CM | POA: Insufficient documentation

## 2020-06-13 DIAGNOSIS — Z86010 Personal history of colonic polyps: Secondary | ICD-10-CM | POA: Insufficient documentation

## 2020-06-13 DIAGNOSIS — Z90711 Acquired absence of uterus with remaining cervical stump: Secondary | ICD-10-CM | POA: Diagnosis not present

## 2020-06-13 DIAGNOSIS — K219 Gastro-esophageal reflux disease without esophagitis: Secondary | ICD-10-CM | POA: Diagnosis not present

## 2020-06-13 HISTORY — DX: Low back pain, unspecified: M54.50

## 2020-06-13 HISTORY — PX: COLONOSCOPY WITH PROPOFOL: SHX5780

## 2020-06-13 HISTORY — DX: Unspecified urinary incontinence: R32

## 2020-06-13 SURGERY — COLONOSCOPY WITH PROPOFOL
Anesthesia: General

## 2020-06-13 MED ORDER — PROPOFOL 500 MG/50ML IV EMUL
INTRAVENOUS | Status: DC | PRN
  Administered 2020-06-13: 150 ug/kg/min via INTRAVENOUS

## 2020-06-13 MED ORDER — PROPOFOL 500 MG/50ML IV EMUL
INTRAVENOUS | Status: AC
  Filled 2020-06-13: qty 50

## 2020-06-13 MED ORDER — LIDOCAINE HCL (CARDIAC) PF 100 MG/5ML IV SOSY
PREFILLED_SYRINGE | INTRAVENOUS | Status: DC | PRN
  Administered 2020-06-13: 50 mg via INTRAVENOUS

## 2020-06-13 MED ORDER — PROPOFOL 10 MG/ML IV BOLUS
INTRAVENOUS | Status: DC | PRN
  Administered 2020-06-13: 60 mg via INTRAVENOUS
  Administered 2020-06-13: 40 mg via INTRAVENOUS

## 2020-06-13 MED ORDER — SODIUM CHLORIDE 0.9 % IV SOLN
INTRAVENOUS | Status: DC
  Administered 2020-06-13: 20 mL/h via INTRAVENOUS

## 2020-06-13 MED ORDER — LIDOCAINE HCL (PF) 2 % IJ SOLN
INTRAMUSCULAR | Status: AC
  Filled 2020-06-13: qty 5

## 2020-06-13 NOTE — Anesthesia Preprocedure Evaluation (Addendum)
Anesthesia Evaluation  Patient identified by MRN, date of birth, ID band Patient awake    Reviewed: Allergy & Precautions, NPO status , Patient's Chart, lab work & pertinent test results  History of Anesthesia Complications Negative for: history of anesthetic complications  Airway Mallampati: II  TM Distance: <3 FB Neck ROM: Full    Dental no notable dental hx. (+) Teeth Intact   Pulmonary shortness of breath and with exertion, neg sleep apnea, neg COPD, Patient abstained from smoking.Not current smoker,    Pulmonary exam normal breath sounds clear to auscultation       Cardiovascular Exercise Tolerance: Good METS(-) hypertension(-) CAD and (-) Past MI negative cardio ROS  (-) dysrhythmias  Rhythm:Regular Rate:Normal - Systolic murmurs TTE unremarkable; low risk exercise stress test without ischemia   Neuro/Psych neg Headaches, negative psych ROS   GI/Hepatic GERD  ,(+)     (-) substance abuse  ,   Endo/Other  neg diabetesHypothyroidism   Renal/GU negative Renal ROS     Musculoskeletal   Abdominal   Peds  Hematology   Anesthesia Other Findings Past Medical History: 07/2019: COVID-19 No date: GERD (gastroesophageal reflux disease) No date: Headache     Comment:  sinus No date: History of abnormal mammogram     Comment:  seen by Dr. Jamal Collin No date: Hypothyroidism 1990: ITP (idiopathic thrombocytopenic purpura)     Comment:  no issues in several years No date: Low back pain 2016: Tubular adenoma of colon No date: Tubular adenoma of colon No date: Urinary incontinence No date: Wears contact lenses  Reproductive/Obstetrics                            Anesthesia Physical Anesthesia Plan  ASA: II  Anesthesia Plan: General   Post-op Pain Management:    Induction: Intravenous  PONV Risk Score and Plan: 3 and Ondansetron, Propofol infusion and TIVA  Airway Management Planned:  Nasal Cannula  Additional Equipment: None  Intra-op Plan:   Post-operative Plan:   Informed Consent: I have reviewed the patients History and Physical, chart, labs and discussed the procedure including the risks, benefits and alternatives for the proposed anesthesia with the patient or authorized representative who has indicated his/her understanding and acceptance.     Dental advisory given  Plan Discussed with: CRNA and Surgeon  Anesthesia Plan Comments: (Discussed risks of anesthesia with patient, including possibility of difficulty with spontaneous ventilation under anesthesia necessitating airway intervention, PONV, and rare risks such as cardiac or respiratory or neurological events. Patient understands.)        Anesthesia Quick Evaluation

## 2020-06-13 NOTE — H&P (Signed)
Martha Henry [id number] 12-30-1965     HPI:  Past history of colon polyps, (adenomatous).  For repeat colonoscopy.   Medications Prior to Admission  Medication Sig Dispense Refill Last Dose  . Calcium-Vitamin D-Vitamin K 185-631-49 MG-UNT-MCG CHEW Chew by mouth.   Past Week at Unknown time  . cetirizine (ZYRTEC) 10 MG tablet Take 10 mg by mouth daily.   Past Week at Unknown time  . Cyanocobalamin (VITAMIN B-12 PO) Take by mouth daily.   Past Week at Unknown time  . fluticasone (FLONASE) 50 MCG/ACT nasal spray USE 2 SPRAY(S) IN EACH NOSTRIL TWICE DAILY 16 g 0 Past Week at Unknown time  . lansoprazole (PREVACID) 30 MG capsule Take 1 capsule (30 mg total) by mouth daily. 90 capsule 1 Past Week at Unknown time  . levothyroxine (SYNTHROID) 50 MCG tablet Take 1 tablet by mouth once daily 90 tablet 0 06/12/2020 at Unknown time  . levothyroxine (SYNTHROID) 75 MCG tablet Take by mouth.   06/12/2020 at Unknown time  . mirabegron ER (MYRBETRIQ) 50 MG TB24 tablet Take 1 tablet (50 mg total) by mouth daily. 90 tablet 3 Past Week at Unknown time  . montelukast (SINGULAIR) 10 MG tablet Take 1 tablet (10 mg total) by mouth at bedtime. 90 tablet 0 Past Week at Unknown time  . Multiple Vitamins-Minerals (MULTIVITAMIN WITH MINERALS) tablet Take by mouth.   Past Week at Unknown time  . senna (SENOKOT) 8.6 MG tablet Take 1 tablet by mouth daily.   Past Week at Unknown time  . Turmeric Curcumin 500 MG CAPS Take by mouth daily.    Past Week at Unknown time   Allergies  Allergen Reactions  . Erythromycin Other (See Comments)    Chest pain  . Aspirin Other (See Comments)    Platelet problems  . Ciprofloxacin Other (See Comments)    constipation   Past Medical History:  Diagnosis Date  . COVID-19 07/2019  . GERD (gastroesophageal reflux disease)   . Headache    sinus  . History of abnormal mammogram    seen by Dr. Jamal Collin  . Hypothyroidism   . ITP (idiopathic thrombocytopenic purpura) 1990   no  issues in several years  . Low back pain   . Tubular adenoma of colon 2016  . Tubular adenoma of colon   . Urinary incontinence   . Wears contact lenses    Past Surgical History:  Procedure Laterality Date  . ABDOMINAL HYSTERECTOMY  2003   partial/only ovaries remain  . APPENDECTOMY  2009  . BREAST CYST ASPIRATION Left 2019  . CHOLECYSTECTOMY  2009  . COLONOSCOPY WITH PROPOFOL N/A 02/26/2015   Procedure: COLONOSCOPY WITH PROPOFOL;  Surgeon: Christene Lye, MD;  Location: [facility] ENDOSCOPY;  Service: Endoscopy;  Laterality: N/A;  . NASAL TURBINATE REDUCTION Bilateral 05/02/2015   Procedure: TURBINATE REDUCTION/SUBMUCOSAL RESECTION;  Surgeon: Beverly Gust, MD;  Location: [place];  Service: ENT;  Laterality: Bilateral;  . SEPTOPLASTY N/A 05/02/2015   Procedure: SEPTOPLASTY;  Surgeon: Beverly Gust, MD;  Location: [place];  Service: ENT;  Laterality: N/A;  . TONSILLECTOMY  1975   Social History   Socioeconomic History  . Marital status: Married    Spouse name: Not on file  . Number of children: Not on file  . Years of education: Not on file  . Highest education level: Not on file  Occupational History  . Not on file  Tobacco Use  . Smoking status: Never Smoker  . Smokeless tobacco:  Never Used  Vaping Use  . Vaping Use: Never used  Substance and Sexual Activity  . Alcohol use: No  . Drug use: No  . Sexual activity: Not on file  Other Topics Concern  . Not on file  Social History Narrative  . Not on file   Social Determinants of Health   Financial Resource Strain: Not on file  Food Insecurity: Not on file  Transportation Needs: Not on file  Physical Activity: Not on file  Stress: Not on file  Social Connections: Not on file  Intimate Partner Violence: Not on file   Social History   Social History Narrative  . Not on file     ROS: Negative.     PE: HEENT: Negative. Lungs: Clear. Cardio:  RR.   Assessment/Plan:  Proceed with planned endoscopy.   Forest Gleason [facility] 06/13/2020

## 2020-06-13 NOTE — Op Note (Signed)
[facility] Gastroenterology Patient Name: Martha Henry Procedure Date: 06/13/2020 7:16 AM MRN: [id number] Account #: [id number] Date of Birth: May 03, 1966 Admit Type: Outpatient Age: 55 Room: [facility] ENDO ROOM 1 Gender: Female Note Status: Finalized Procedure:             Colonoscopy Indications:           High risk colon cancer surveillance: Personal history                         of colonic polyps Providers:             Robert Bellow, MD Referring MD:          Jon Billings NP Medicines:             Monitored Anesthesia Care Complications:         No immediate complications. Procedure:             Pre-Anesthesia Assessment:                        - Prior to the procedure, a History and Physical was                         performed, and patient medications, allergies and                         sensitivities were reviewed. The patient's tolerance                         of previous anesthesia was reviewed.                        - The risks and benefits of the procedure and the                         sedation options and risks were discussed with the                         patient. All questions were answered and informed                         consent was obtained.                        After obtaining informed consent, the colonoscope was                         passed under direct vision. Throughout the procedure,                         the patient's blood pressure, pulse, and oxygen                         saturations were monitored continuously. The was                         introduced through the anus and advanced to the the                         cecum, identified by appendiceal orifice  and ileocecal                         valve. The colonoscopy was performed without                         difficulty. The patient tolerated the procedure well.                         The quality of the bowel preparation was excellent.                         Patient previously noted nausea and vomiting with                         Miralax prep. Received Dulcolax day prior and Reglan                         prior to Miralax and did very well. Findings:      The entire examined colon appeared normal on direct and retroflexion       views. Impression:            - The entire examined colon is normal on direct and                         retroflexion views.                        - No specimens collected. Recommendation:        - Repeat colonoscopy in 7 years for surveillance. Procedure Code(s):     --- Professional ---                        [phone], Colonoscopy, flexible; diagnostic, including                         collection of specimen(s) by brushing or washing, when                         performed (separate procedure) Diagnosis Code(s):     --- Professional ---                        Z86.010, Personal history of colonic polyps CPT copyright 2019 American Medical Association. All rights reserved. The codes documented in this report are preliminary and upon coder review may  be revised to meet current compliance requirements. Robert Bellow, MD 06/13/2020 7:58:00 AM This report has been signed electronically. Number of Addenda: 0 Note Initiated On: 06/13/2020 7:16 AM Scope Withdrawal Time: 0 hours 12 minutes 6 seconds  Total Procedure Duration: 0 hours 19 minutes 48 seconds  Estimated Blood Loss:  Estimated blood loss: none.      [facility]

## 2020-06-13 NOTE — Anesthesia Postprocedure Evaluation (Signed)
Anesthesia Post Note  Patient: Martha Henry  Procedure(s) Performed: COLONOSCOPY WITH PROPOFOL (N/A )  Patient location during evaluation: Endoscopy Anesthesia Type: General Level of consciousness: awake and alert Pain management: pain level controlled Vital Signs Assessment: post-procedure vital signs reviewed and stable Respiratory status: spontaneous breathing, nonlabored ventilation, respiratory function stable and patient connected to nasal cannula oxygen Cardiovascular status: blood pressure returned to baseline and stable Postop Assessment: no apparent nausea or vomiting Anesthetic complications: no   No complications documented.   Last Vitals:  Vitals:   06/13/20 0810 06/13/20 0820  BP: 119/86 (!) 122/93  Pulse: (!) 102 96  Resp: 19 18  Temp:    SpO2: 99% 100%    Last Pain:  Vitals:   06/13/20 0658  TempSrc: Temporal  PainSc: 3                  Arita Miss

## 2020-06-13 NOTE — Transfer of Care (Signed)
Immediate Anesthesia Transfer of Care Note  Patient: Martha Henry  Procedure(s) Performed: COLONOSCOPY WITH PROPOFOL (N/A )  Patient Location: Endoscopy Unit  Anesthesia Type:General  Level of Consciousness: drowsy  Airway & Oxygen Therapy: Patient Spontanous Breathing  Post-op Assessment: Report given to RN and Post -op Vital signs reviewed and stable  Post vital signs: Reviewed and stable  Last Vitals:  Vitals Value Taken Time  BP    Temp    Pulse 95 06/13/20 0800  Resp 16 06/13/20 0800  SpO2 98 % 06/13/20 0800  Vitals shown include unvalidated device data.  Last Pain:  Vitals:   06/13/20 0658  TempSrc: Temporal  PainSc: 3          Complications: No complications documented.

## 2020-06-16 DIAGNOSIS — M5414 Radiculopathy, thoracic region: Secondary | ICD-10-CM | POA: Diagnosis not present

## 2020-06-16 DIAGNOSIS — M9902 Segmental and somatic dysfunction of thoracic region: Secondary | ICD-10-CM | POA: Diagnosis not present

## 2020-06-16 DIAGNOSIS — R519 Headache, unspecified: Secondary | ICD-10-CM | POA: Diagnosis not present

## 2020-06-16 DIAGNOSIS — M9901 Segmental and somatic dysfunction of cervical region: Secondary | ICD-10-CM | POA: Diagnosis not present

## 2020-07-07 DIAGNOSIS — M5414 Radiculopathy, thoracic region: Secondary | ICD-10-CM | POA: Diagnosis not present

## 2020-07-07 DIAGNOSIS — M9901 Segmental and somatic dysfunction of cervical region: Secondary | ICD-10-CM | POA: Diagnosis not present

## 2020-07-07 DIAGNOSIS — M9902 Segmental and somatic dysfunction of thoracic region: Secondary | ICD-10-CM | POA: Diagnosis not present

## 2020-07-07 DIAGNOSIS — R519 Headache, unspecified: Secondary | ICD-10-CM | POA: Diagnosis not present

## 2020-07-12 ENCOUNTER — Other Ambulatory Visit: Payer: Self-pay | Admitting: Family Medicine

## 2020-07-12 NOTE — Telephone Encounter (Signed)
Requested Prescriptions  Pending Prescriptions Disp Refills  . fluticasone (FLONASE) 50 MCG/ACT nasal spray [Pharmacy Med Name: Fluticasone Propionate 50 MCG/ACT Nasal Suspension] 16 g 1    Sig: USE 2 SPRAY(S) IN EACH NOSTRIL TWICE DAILY     Ear, Nose, and Throat: Nasal Preparations - Corticosteroids Passed - 07/12/2020  9:58 AM      Passed - Valid encounter within last 12 months    Recent Outpatient Visits          3 months ago Routine general medical examination at a health care facility   [facility], [place], DO   11 months ago COVID-19 virus infection   [facility], [place], [place]   11 months ago Lab test positive for detection of COVID-19 virus   [facility] [place], [place], NP   1 year ago Fatigue, unspecified type   [facility], [place] Argue, [place]   1 year ago Acute maxillary sinusitis, recurrence not specified   [facility] [place], [place]      Future Appointments            In 2 months MacDiarmid, Nicki Reaper, [place]

## 2020-07-28 DIAGNOSIS — M9902 Segmental and somatic dysfunction of thoracic region: Secondary | ICD-10-CM | POA: Diagnosis not present

## 2020-07-28 DIAGNOSIS — R519 Headache, unspecified: Secondary | ICD-10-CM | POA: Diagnosis not present

## 2020-07-28 DIAGNOSIS — M5414 Radiculopathy, thoracic region: Secondary | ICD-10-CM | POA: Diagnosis not present

## 2020-07-28 DIAGNOSIS — M9901 Segmental and somatic dysfunction of cervical region: Secondary | ICD-10-CM | POA: Diagnosis not present

## 2020-08-19 DIAGNOSIS — R519 Headache, unspecified: Secondary | ICD-10-CM | POA: Diagnosis not present

## 2020-08-19 DIAGNOSIS — M9901 Segmental and somatic dysfunction of cervical region: Secondary | ICD-10-CM | POA: Diagnosis not present

## 2020-08-19 DIAGNOSIS — M5414 Radiculopathy, thoracic region: Secondary | ICD-10-CM | POA: Diagnosis not present

## 2020-08-19 DIAGNOSIS — M9902 Segmental and somatic dysfunction of thoracic region: Secondary | ICD-10-CM | POA: Diagnosis not present

## 2020-08-21 ENCOUNTER — Other Ambulatory Visit: Payer: Self-pay | Admitting: Family Medicine

## 2020-08-26 DIAGNOSIS — M9902 Segmental and somatic dysfunction of thoracic region: Secondary | ICD-10-CM | POA: Diagnosis not present

## 2020-08-26 DIAGNOSIS — R519 Headache, unspecified: Secondary | ICD-10-CM | POA: Diagnosis not present

## 2020-08-26 DIAGNOSIS — M5414 Radiculopathy, thoracic region: Secondary | ICD-10-CM | POA: Diagnosis not present

## 2020-08-26 DIAGNOSIS — M9901 Segmental and somatic dysfunction of cervical region: Secondary | ICD-10-CM | POA: Diagnosis not present

## 2020-08-27 ENCOUNTER — Ambulatory Visit: Payer: Self-pay | Admitting: *Deleted

## 2020-08-27 NOTE — Telephone Encounter (Signed)
C/o extreme fatigue and numbness and achy in arms and legs within the last month. C/o more aching in back of head to neck to fingertips. Has been seeing a chiropractor every 3 weeks with no relief of aches and numbness in extremities. Taking tylenol and unable to get relief of pain. C/o fatigue severe in evenings and forgetting to take medications within the last month. Denies chest pain, difficulty breathing. Reports she had chest pain in the past and saw cardiologist and test results were ok. Patient able to walk and perform ADL but c/o severe fatigue in the evenings. Patient has [employer]. Needs appt. Care advise given. Patient verbalized understanding of care advise and to call back or go to [facility] or ED if symptoms worsen.  Please advise with appt.   Reason for Disposition . [1] Fatigue (i.e., tires easily, decreased energy) AND [2] persists > 1 week  Answer Assessment - Initial Assessment Questions 1. DESCRIPTION: "Describe how you are feeling."     Extreme fatigue in the evenings and numbness and achy in arms and legs 2. SEVERITY: "How bad is it?"  "Can you stand and walk?"   - MILD - Feels weak or tired, but does not interfere with work, school or normal activities   - [place] to stand and walk; weakness interferes with work, school, or normal activities   - SEVERE - Unable to stand or walk     moderate 3. ONSET:  "When did the weakness begin?"     Increased fatigue with in the last month 4. CAUSE: "What do you think is causing the weakness?"     Not sure. Unable to take medication at night due to going to sleep 5. MEDICINES: "Have you recently started a new medicine or had a change in the amount of a medicine?"     No. Difficulty taking medications at night due to falling asleep  6. OTHER SYMPTOMS: "Do you have any other symptoms?" (e.g., chest pain, fever, cough, SOB, vomiting, diarrhea, bleeding, other areas of pain)     No  7. PREGNANCY: "Is there any chance you are  pregnant?" "When was your last menstrual period?"     na  Protocols used: WEAKNESS (GENERALIZED) AND FATIGUE-A-AH

## 2020-08-27 NOTE — Telephone Encounter (Signed)
Scheduled 4/29 with lauren. Is it ok to come in office since she has been dealing with this for a month?

## 2020-08-29 ENCOUNTER — Other Ambulatory Visit: Payer: Self-pay

## 2020-08-29 ENCOUNTER — Ambulatory Visit: Payer: BC Managed Care – PPO | Admitting: Nurse Practitioner

## 2020-08-29 ENCOUNTER — Encounter: Payer: Self-pay | Admitting: Nurse Practitioner

## 2020-08-29 VITALS — BP 121/87 | HR 67 | Temp 97.4°F | Wt 164.0 lb

## 2020-08-29 DIAGNOSIS — R5383 Other fatigue: Secondary | ICD-10-CM | POA: Diagnosis not present

## 2020-08-29 DIAGNOSIS — E039 Hypothyroidism, unspecified: Secondary | ICD-10-CM

## 2020-08-29 NOTE — Progress Notes (Addendum)
Acute Office Visit  Subjective:    Patient ID: Martha Henry, female    DOB: [birth year], 55 y.o.   MRN: [id number]  Chief Complaint  Patient presents with  . Fatigue    Pt states she has had fatigue and numbness off and on for years, States it was getting worse within the last month. States she gets to the afternoon and she just feels mentally and physically drained. States she has been seeing a chiropractor for the numbness in her arms and hands. States within this last month, she has had all over aching in various places at various times    HPI Patient is in today for fatigue and multiple joint and body aches.    FATIGUE  Duration:  months worsened in the last month Severity: severe  Onset: gradual Context when symptoms started:  unknown Symptoms improve with rest: sometimes  Depressive symptoms: no Stress/anxiety: no Insomnia: yes hard to stay asleep Snoring: yes Observed apnea by bed partner: no Daytime hypersomnolence:yes Wakes feeling refreshed: no History of sleep study: no Dysnea on exertion:  no Orthopnea/PND: no Chest pain: no Chronic cough: no Lower extremity edema: no Arthralgias:yes Myalgias: yes Weakness: yes Rash: no  ARTHRALGIAS / JOINT ACHES  Duration: months last month Pain: yes Symmetric: yes  moderate Quality: dull and aching Frequency: intermittent Context:  fluctuating Decreased function/range of motion: no Erythema: no Swelling: no Heat or warmth: no Morning stiffness: no Aggravating factors: unknown  Alleviating factors: tylenol and chiropractor help some Relief with NSAIDs?: No NSAIDs Taken Treatments attempted:  rest and APAP, chiropractor  Involved Joints:     Hands: no     Wrists: yes bilateral     Elbows: no    Shoulders: yes bilateral    Back: no     Hips: yes bilateral    Knees: yes bilateral    Ankles: yes bilateral    Feet: no    Past Medical History:  Diagnosis Date  . COVID-19 07/2019  . GERD  (gastroesophageal reflux disease)   . Headache    sinus  . History of abnormal mammogram    seen by Dr. Jamal Collin  . Hypothyroidism   . ITP (idiopathic thrombocytopenic purpura) 1990   no issues in several years  . Low back pain   . Tubular adenoma of colon 2016  . Tubular adenoma of colon   . Urinary incontinence   . Wears contact lenses     Past Surgical History:  Procedure Laterality Date  . ABDOMINAL HYSTERECTOMY  2003   partial/only ovaries remain  . APPENDECTOMY  2009  . BREAST CYST ASPIRATION Left 2019  . CHOLECYSTECTOMY  2009  . COLONOSCOPY WITH PROPOFOL N/A 02/26/2015   Procedure: COLONOSCOPY WITH PROPOFOL;  Surgeon: Christene Lye, MD;  Location: [facility] ENDOSCOPY;  Service: Endoscopy;  Laterality: N/A;  . COLONOSCOPY WITH PROPOFOL N/A 06/13/2020   Procedure: COLONOSCOPY WITH PROPOFOL;  Surgeon: Robert Bellow, MD;  Location: [facility] ENDOSCOPY;  Service: Endoscopy;  Laterality: N/A;  . NASAL TURBINATE REDUCTION Bilateral 05/02/2015   Procedure: TURBINATE REDUCTION/SUBMUCOSAL RESECTION;  Surgeon: Beverly Gust, MD;  Location: [place];  Service: ENT;  Laterality: Bilateral;  . SEPTOPLASTY N/A 05/02/2015   Procedure: SEPTOPLASTY;  Surgeon: Beverly Gust, MD;  Location: [place];  Service: ENT;  Laterality: N/A;  . TONSILLECTOMY  1975    Family History  Problem Relation Age of Onset  . Cancer Mother 68       breast  . Breast  cancer Mother 60  . Colon cancer Father 28  . Cancer Father        colon  . Breast cancer Maternal Aunt   . Cancer Maternal Aunt        breast  . Ovarian cancer Maternal Aunt        Stage 4  . Lung cancer Maternal Grandfather   . Cervical cancer Maternal Grandmother     Social History   Socioeconomic History  . Marital status: Married    Spouse name: Not on file  . Number of children: Not on file  . Years of education: Not on file  . Highest education level: Not on file  Occupational History  . Not on  file  Tobacco Use  . Smoking status: Never Smoker  . Smokeless tobacco: Never Used  Vaping Use  . Vaping Use: Never used  Substance and Sexual Activity  . Alcohol use: No  . Drug use: No  . Sexual activity: Yes  Other Topics Concern  . Not on file  Social History Narrative  . Not on file   Social Determinants of Health   Financial Resource Strain: Not on file  Food Insecurity: Not on file  Transportation Needs: Not on file  Physical Activity: Not on file  Stress: Not on file  Social Connections: Not on file  Intimate Partner Violence: Not on file    Outpatient Medications Prior to Visit  Medication Sig Dispense Refill  . Calcium-Vitamin D-Vitamin K [id number] MG-UNT-MCG CHEW Chew by mouth.    . cetirizine (ZYRTEC) 10 MG tablet Take 10 mg by mouth daily.    . Cyanocobalamin (VITAMIN B-12 PO) Take by mouth daily.    . fluticasone (FLONASE) 50 MCG/ACT nasal spray USE 2 SPRAY(S) IN EACH NOSTRIL TWICE DAILY 16 g 1  . lansoprazole (PREVACID) 30 MG capsule Take 1 capsule (30 mg total) by mouth daily. 90 capsule 1  . levothyroxine (SYNTHROID) 50 MCG tablet Take 1 tablet by mouth once daily 90 tablet 0  . levothyroxine (SYNTHROID) 75 MCG tablet Take by mouth.    . mirabegron ER (MYRBETRIQ) 50 MG TB24 tablet Take 1 tablet (50 mg total) by mouth daily. 90 tablet 3  . montelukast (SINGULAIR) 10 MG tablet TAKE 1 TABLET BY MOUTH AT BEDTIME 90 tablet 0  . Multiple Vitamins-Minerals (MULTIVITAMIN WITH MINERALS) tablet Take by mouth.    . Turmeric Curcumin 500 MG CAPS Take by mouth daily.     Marland Kitchen senna (SENOKOT) 8.6 MG tablet Take 1 tablet by mouth daily.     No facility-administered medications prior to visit.    Allergies  Allergen Reactions  . Erythromycin Other (See Comments)    Chest pain  . Aspirin Other (See Comments)    Platelet problems  . Ciprofloxacin Other (See Comments)    constipation    Review of Systems  Constitutional: Positive for fatigue. Negative for fever and  unexpected weight change.  HENT: Negative.   Eyes: Negative.   Respiratory: Negative.   Cardiovascular: Negative.        Had chest pain earlier this year, however her cardiac workup was negative and is not having chest pain anymore.  Gastrointestinal: Negative.   Endocrine: Negative.   Genitourinary: Negative.   Musculoskeletal: Positive for arthralgias. Negative for joint swelling.  Skin: Negative.   Neurological: Positive for numbness (some numbness in her arms, chronic).  Psychiatric/Behavioral: Negative.        Objective:    Physical Exam Vitals and nursing note  reviewed.  Constitutional:      General: She is not in acute distress.    Appearance: Normal appearance.  HENT:     Head: Normocephalic.  Eyes:     Conjunctiva/sclera: Conjunctivae normal.  Cardiovascular:     Rate and Rhythm: Normal rate and regular rhythm.     Pulses: Normal pulses.     Heart sounds: Normal heart sounds.  Pulmonary:     Effort: Pulmonary effort is normal.     Breath sounds: Normal breath sounds.  Abdominal:     Palpations: Abdomen is soft.     Tenderness: There is no abdominal tenderness.  Musculoskeletal:        General: Normal range of motion.     Cervical back: Normal range of motion.     Comments: Strength 5/5 in bilateral upper and lower extremities  Skin:    General: Skin is warm.  Neurological:     General: No focal deficit present.     Mental Status: She is alert and oriented to person, place, and time.     Cranial Nerves: No cranial nerve deficit.     Coordination: Coordination normal.     Gait: Gait normal.  Psychiatric:        Mood and Affect: Mood normal.        Behavior: Behavior normal.        Thought Content: Thought content normal.        Judgment: Judgment normal.     BP 121/87   Pulse 67   Temp (!) 97.4 F (36.3 C) (Oral)   Wt 164 lb (74.4 kg)   SpO2 99%   BMI 30.00 kg/m  Wt Readings from Last 3 Encounters:  08/29/20 164 lb (74.4 kg)  06/13/20 160 lb  (72.6 kg)  04/07/20 164 lb (74.4 kg)    There are no preventive care reminders to display for this patient.  There are no preventive care reminders to display for this patient.   Lab Results  Component Value Date   TSH 3.270 08/29/2020   Lab Results  Component Value Date   WBC 8.6 08/29/2020   HGB 13.5 08/29/2020   HCT 42.4 08/29/2020   MCV 82 08/29/2020   PLT 365 08/29/2020   Lab Results  Component Value Date   NA 140 08/29/2020   K 4.3 08/29/2020   CO2 21 08/29/2020   GLUCOSE 76 08/29/2020   BUN 10 08/29/2020   CREATININE 0.76 08/29/2020   BILITOT 0.4 08/29/2020   ALKPHOS 95 08/29/2020   AST 24 08/29/2020   ALT 19 08/29/2020   PROT 7.1 08/29/2020   ALBUMIN 4.4 08/29/2020   CALCIUM 9.7 08/29/2020   ANIONGAP 11 09/25/2016   EGFR 94 08/29/2020   Lab Results  Component Value Date   CHOL 168 03/18/2020   Lab Results  Component Value Date   HDL 59 03/18/2020   Lab Results  Component Value Date   LDLCALC 96 03/18/2020   Lab Results  Component Value Date   TRIG 68 03/18/2020   No results found for: CHOLHDL No results found for: HGBA1C     Assessment & Plan:   Problem List Items Addressed This Visit      Endocrine   Hypothyroidism    She is currently taking levothyroxine 4mg 4 days a week and 752m 3 days a week (alternating days). She knocked over her bottles and may have mixed up which doses she was taking 3 days vs 4 days. Will check TSH today.  Relevant Orders   TSH (Completed)     Other   Fatigue - Primary    Has chronic fatigue, however worsened over the last month with full body joint aches. Will check CMP, CBC, vitamin b12, vitamin D, lyme disease, and rocky mountain spotted fever. If these are negative will consider ana, esr, and crp. She declines a sleep study because she wouldn't be able to wear a cpap mask at night due to claustrophobia. Will follow-up and treat based on results.       Relevant Orders   Comp Met (CMET)  (Completed)   Vitamin B12 (Completed)   CBC w/Diff (Completed)   Vitamin D (25 hydroxy) (Completed)   Lyme disease, western blot   Rocky mtn spotted fvr abs pnl(IgG+IgM) (Completed)       No orders of the defined types were placed in this encounter.   Charyl Dancer, NP

## 2020-08-30 DIAGNOSIS — R5383 Other fatigue: Secondary | ICD-10-CM | POA: Insufficient documentation

## 2020-08-30 NOTE — Assessment & Plan Note (Signed)
She is currently taking levothyroxine 28mcg 4 days a week and 65mcg 3 days a week (alternating days). She knocked over her bottles and may have mixed up which doses she was taking 3 days vs 4 days. Will check TSH today.

## 2020-08-30 NOTE — Assessment & Plan Note (Signed)
Has chronic fatigue, however worsened over the last month with full body joint aches. Will check CMP, CBC, vitamin b12, vitamin D, lyme disease, and rocky mountain spotted fever. If these are negative will consider ana, esr, and crp. She declines a sleep study because she wouldn't be able to wear a cpap mask at night due to claustrophobia. Will follow-up and treat based on results.  

## 2020-09-02 LAB — CBC WITH DIFFERENTIAL/PLATELET
Basophils Absolute: 0.1 10*3/uL (ref 0.0–0.2)
Basos: 1 %
EOS (ABSOLUTE): 0.2 10*3/uL (ref 0.0–0.4)
Eos: 2 %
Hematocrit: 42.4 % (ref 34.0–46.6)
Hemoglobin: 13.5 g/dL (ref 11.1–15.9)
Immature Grans (Abs): 0 10*3/uL (ref 0.0–0.1)
Immature Granulocytes: 0 %
Lymphocytes Absolute: 3.2 10*3/uL — ABNORMAL HIGH (ref 0.7–3.1)
Lymphs: 38 %
MCH: 26.2 pg — ABNORMAL LOW (ref 26.6–33.0)
MCHC: 31.8 g/dL (ref 31.5–35.7)
MCV: 82 fL (ref 79–97)
Monocytes Absolute: 0.6 10*3/uL (ref 0.1–0.9)
Monocytes: 7 %
Neutrophils Absolute: 4.5 10*3/uL (ref 1.4–7.0)
Neutrophils: 52 %
Platelets: 365 10*3/uL (ref 150–450)
RBC: 5.16 x10E6/uL (ref 3.77–5.28)
RDW: 12.9 % (ref 11.7–15.4)
WBC: 8.6 10*3/uL (ref 3.4–10.8)

## 2020-09-02 LAB — COMPREHENSIVE METABOLIC PANEL
ALT: 19 IU/L (ref 0–32)
AST: 24 IU/L (ref 0–40)
Albumin/Globulin Ratio: 1.6 (ref 1.2–2.2)
Albumin: 4.4 g/dL (ref 3.8–4.9)
Alkaline Phosphatase: 95 IU/L (ref 44–121)
BUN/Creatinine Ratio: 13 (ref 9–23)
BUN: 10 mg/dL (ref 6–24)
Bilirubin Total: 0.4 mg/dL (ref 0.0–1.2)
CO2: 21 mmol/L (ref 20–29)
Calcium: 9.7 mg/dL (ref 8.7–10.2)
Chloride: 97 mmol/L (ref 96–106)
Creatinine, Ser: 0.76 mg/dL (ref 0.57–1.00)
Globulin, Total: 2.7 g/dL (ref 1.5–4.5)
Glucose: 76 mg/dL (ref 65–99)
Potassium: 4.3 mmol/L (ref 3.5–5.2)
Sodium: 140 mmol/L (ref 134–144)
Total Protein: 7.1 g/dL (ref 6.0–8.5)
eGFR: 94 mL/min/{1.73_m2} (ref >59)

## 2020-09-02 LAB — VITAMIN B12: Vitamin B-12: 1053 pg/mL (ref 232–1245)

## 2020-09-02 LAB — TSH: TSH: 3.27 u[IU]/mL (ref 0.450–4.500)

## 2020-09-02 LAB — ROCKY MTN SPOTTED FVR ABS PNL(IGG+IGM)
RMSF IgG: NEGATIVE
RMSF IgM: 0.36 index (ref 0.00–0.89)

## 2020-09-02 LAB — VITAMIN D 25 HYDROXY (VIT D DEFICIENCY, FRACTURES): Vit D, 25-Hydroxy: 40.6 ng/mL (ref 30.0–100.0)

## 2020-09-04 LAB — LYME DISEASE, WESTERN BLOT
IgG P18 Ab.: ABSENT
IgG P23 Ab.: ABSENT
IgG P28 Ab.: ABSENT
IgG P30 Ab.: ABSENT
IgG P39 Ab.: ABSENT
IgG P45 Ab.: ABSENT
IgG P58 Ab.: ABSENT
IgG P66 Ab.: ABSENT
IgG P93 Ab.: ABSENT
IgM P23 Ab.: ABSENT
IgM P39 Ab.: ABSENT
IgM P41 Ab.: ABSENT
Lyme IgG Wb: NEGATIVE
Lyme IgM Wb: NEGATIVE

## 2020-09-04 LAB — LYME DISEASE SEROLOGY W/REFLEX: Lyme Total Antibody EIA: NEGATIVE

## 2020-09-09 ENCOUNTER — Encounter: Payer: Self-pay | Admitting: Nurse Practitioner

## 2020-09-09 ENCOUNTER — Ambulatory Visit: Payer: BC Managed Care – PPO | Admitting: Nurse Practitioner

## 2020-09-09 ENCOUNTER — Other Ambulatory Visit: Payer: Self-pay

## 2020-09-09 VITALS — BP 110/77 | HR 65 | Temp 98.4°F | Ht 61.81 in | Wt 165.2 lb

## 2020-09-09 DIAGNOSIS — R5383 Other fatigue: Secondary | ICD-10-CM | POA: Diagnosis not present

## 2020-09-09 NOTE — Progress Notes (Signed)
Established Patient Office Visit  Subjective:  Patient ID: Martha Henry, female    DOB: 10/30/1965  Age: 55 y.o. MRN: [id number]  CC:  Chief Complaint  Patient presents with  . Fatigue    Patient states that she is feeling better.    HPI Martha Henry presents for follow-up on her fatigue and joint aches. She stated that over the last week she has been taking her medications more regularly and gotten back on schedule. Taking tumeric helps significantly with her joint pain. Her symptoms of fatigue and joint aches have basically resolved.   Past Medical History:  Diagnosis Date  . COVID-19 07/2019  . GERD (gastroesophageal reflux disease)   . Headache    sinus  . History of abnormal mammogram    seen by Dr. Jamal Collin  . Hypothyroidism   . ITP (idiopathic thrombocytopenic purpura) 1990   no issues in several years  . Low back pain   . Tubular adenoma of colon 2016  . Tubular adenoma of colon   . Urinary incontinence   . Wears contact lenses     Past Surgical History:  Procedure Laterality Date  . ABDOMINAL HYSTERECTOMY  2003   partial/only ovaries remain  . APPENDECTOMY  2009  . BREAST CYST ASPIRATION Left 2019  . CHOLECYSTECTOMY  2009  . COLONOSCOPY WITH PROPOFOL N/A 02/26/2015   Procedure: COLONOSCOPY WITH PROPOFOL;  Surgeon: Christene Lye, MD;  Location: [facility];  Service: Endoscopy;  Laterality: N/A;  . COLONOSCOPY WITH PROPOFOL N/A 06/13/2020   Procedure: COLONOSCOPY WITH PROPOFOL;  Surgeon: Robert Bellow, MD;  Location: [facility];  Service: Endoscopy;  Laterality: N/A;  . NASAL TURBINATE REDUCTION Bilateral 05/02/2015   Procedure: TURBINATE REDUCTION/SUBMUCOSAL RESECTION;  Surgeon: Beverly Gust, MD;  Location: [place];  Service: ENT;  Laterality: Bilateral;  . SEPTOPLASTY N/A 05/02/2015   Procedure: SEPTOPLASTY;  Surgeon: Beverly Gust, MD;  Location: [place];  Service: ENT;  Laterality: N/A;  .  TONSILLECTOMY  1975    Family History  Problem Relation Age of Onset  . Cancer Mother 21       breast  . Breast cancer Mother 71  . Colon cancer Father 33  . Cancer Father        colon  . Breast cancer Maternal Aunt   . Cancer Maternal Aunt        breast  . Ovarian cancer Maternal Aunt        Stage 4  . Lung cancer Maternal Grandfather   . Cervical cancer Maternal Grandmother     Social History   Socioeconomic History  . Marital status: Married    Spouse name: Not on file  . Number of children: Not on file  . Years of education: Not on file  . Highest education level: Not on file  Occupational History  . Not on file  Tobacco Use  . Smoking status: Never Smoker  . Smokeless tobacco: Never Used  Vaping Use  . Vaping Use: Never used  Substance and Sexual Activity  . Alcohol use: No  . Drug use: No  . Sexual activity: Yes  Other Topics Concern  . Not on file  Social History Narrative  . Not on file   Social Determinants of Health   Financial Resource Strain: Not on file  Food Insecurity: Not on file  Transportation Needs: Not on file  Physical Activity: Not on file  Stress: Not on file  Social Connections: Not on file  Intimate Partner Violence: Not on file    Outpatient Medications Prior to Visit  Medication Sig Dispense Refill  . Calcium-Vitamin D-Vitamin K [id number] MG-UNT-MCG CHEW Chew by mouth.    . cetirizine (ZYRTEC) 10 MG tablet Take 10 mg by mouth daily.    . Cyanocobalamin (VITAMIN B-12 PO) Take by mouth daily.    . fluticasone (FLONASE) 50 MCG/ACT nasal spray USE 2 SPRAY(S) IN EACH NOSTRIL TWICE DAILY 16 g 1  . lansoprazole (PREVACID) 30 MG capsule Take 1 capsule (30 mg total) by mouth daily. 90 capsule 1  . levothyroxine (SYNTHROID) 50 MCG tablet Take 1 tablet by mouth once daily 90 tablet 0  . levothyroxine (SYNTHROID) 75 MCG tablet Take by mouth.    . mirabegron ER (MYRBETRIQ) 50 MG TB24 tablet Take 1 tablet (50 mg total) by mouth daily. 90  tablet 3  . montelukast (SINGULAIR) 10 MG tablet TAKE 1 TABLET BY MOUTH AT BEDTIME 90 tablet 0  . Multiple Vitamins-Minerals (MULTIVITAMIN WITH MINERALS) tablet Take by mouth.    . Turmeric Curcumin 500 MG CAPS Take by mouth daily.      No facility-administered medications prior to visit.    Allergies  Allergen Reactions  . Erythromycin Other (See Comments)    Chest pain  . Aspirin Other (See Comments)    Platelet problems  . Ciprofloxacin Other (See Comments)    constipation    ROS Review of Systems  Constitutional: Positive for fatigue (improving). Negative for fever.  HENT: Negative.   Eyes: Negative.   Respiratory: Negative.   Cardiovascular: Negative.   Genitourinary: Negative.   Musculoskeletal: Negative.   Neurological: Negative.       Objective:    Physical Exam Vitals and nursing note reviewed.  Constitutional:      General: She is not in acute distress.    Appearance: Normal appearance.  HENT:     Head: Normocephalic and atraumatic.  Eyes:     Conjunctiva/sclera: Conjunctivae normal.  Neck:     Vascular: No carotid bruit.  Cardiovascular:     Rate and Rhythm: Normal rate and regular rhythm.     Pulses: Normal pulses.     Heart sounds: Normal heart sounds.  Pulmonary:     Effort: Pulmonary effort is normal.     Breath sounds: Normal breath sounds.  Musculoskeletal:     Cervical back: Normal range of motion.  Skin:    General: Skin is warm and dry.  Neurological:     General: No focal deficit present.     Mental Status: She is alert and oriented to person, place, and time.  Psychiatric:        Mood and Affect: Mood normal.        Behavior: Behavior normal.        Thought Content: Thought content normal.        Judgment: Judgment normal.     BP 110/77   Pulse 65   Temp 98.4 F (36.9 C) (Oral)   Ht 5' 1.81" (1.57 m)   Wt 165 lb 3.2 oz (74.9 kg)   SpO2 98%   BMI 30.40 kg/m  Wt Readings from Last 3 Encounters:  09/09/20 165 lb 3.2 oz  (74.9 kg)  08/29/20 164 lb (74.4 kg)  06/13/20 160 lb (72.6 kg)     There are no preventive care reminders to display for this patient.  There are no preventive care reminders to display for this patient.  Lab Results  Component Value Date  TSH 3.270 08/29/2020   Lab Results  Component Value Date   WBC 8.6 08/29/2020   HGB 13.5 08/29/2020   HCT 42.4 08/29/2020   MCV 82 08/29/2020   PLT 365 08/29/2020   Lab Results  Component Value Date   NA 140 08/29/2020   K 4.3 08/29/2020   CO2 21 08/29/2020   GLUCOSE 76 08/29/2020   BUN 10 08/29/2020   CREATININE 0.76 08/29/2020   BILITOT 0.4 08/29/2020   ALKPHOS 95 08/29/2020   AST 24 08/29/2020   ALT 19 08/29/2020   PROT 7.1 08/29/2020   ALBUMIN 4.4 08/29/2020   CALCIUM 9.7 08/29/2020   ANIONGAP 11 09/25/2016   EGFR 94 08/29/2020   Lab Results  Component Value Date   CHOL 168 03/18/2020   Lab Results  Component Value Date   HDL 59 03/18/2020   Lab Results  Component Value Date   LDLCALC 96 03/18/2020   Lab Results  Component Value Date   TRIG 68 03/18/2020   No results found for: CHOLHDL No results found for: HGBA1C    Assessment & Plan:   Problem List Items Addressed This Visit      Other   Fatigue - Primary    Fatigue and joint aches have improved significantly. Continue taking current medications regularly. Follow-up if symptoms worsen.          No orders of the defined types were placed in this encounter.   Follow-up: Return if symptoms worsen or fail to improve.    Charyl Dancer, NP

## 2020-09-09 NOTE — Assessment & Plan Note (Signed)
Fatigue and joint aches have improved significantly. Continue taking current medications regularly. Follow-up if symptoms worsen.

## 2020-09-22 DIAGNOSIS — M5414 Radiculopathy, thoracic region: Secondary | ICD-10-CM | POA: Diagnosis not present

## 2020-09-22 DIAGNOSIS — M9902 Segmental and somatic dysfunction of thoracic region: Secondary | ICD-10-CM | POA: Diagnosis not present

## 2020-09-22 DIAGNOSIS — M9901 Segmental and somatic dysfunction of cervical region: Secondary | ICD-10-CM | POA: Diagnosis not present

## 2020-09-22 DIAGNOSIS — R519 Headache, unspecified: Secondary | ICD-10-CM | POA: Diagnosis not present

## 2020-10-06 ENCOUNTER — Ambulatory Visit: Payer: Self-pay | Admitting: Urology

## 2020-10-13 DIAGNOSIS — R519 Headache, unspecified: Secondary | ICD-10-CM | POA: Diagnosis not present

## 2020-10-13 DIAGNOSIS — M9901 Segmental and somatic dysfunction of cervical region: Secondary | ICD-10-CM | POA: Diagnosis not present

## 2020-10-13 DIAGNOSIS — M5414 Radiculopathy, thoracic region: Secondary | ICD-10-CM | POA: Diagnosis not present

## 2020-10-13 DIAGNOSIS — M9902 Segmental and somatic dysfunction of thoracic region: Secondary | ICD-10-CM | POA: Diagnosis not present

## 2020-10-27 ENCOUNTER — Ambulatory Visit: Payer: BC Managed Care – PPO | Admitting: Urology

## 2020-10-27 ENCOUNTER — Other Ambulatory Visit: Payer: Self-pay

## 2020-10-27 VITALS — BP 147/93 | HR 96 | Ht 61.0 in | Wt 165.0 lb

## 2020-10-27 DIAGNOSIS — N3946 Mixed incontinence: Secondary | ICD-10-CM | POA: Diagnosis not present

## 2020-10-27 MED ORDER — MIRABEGRON ER 50 MG PO TB24: 50.0000 mg | ORAL_TABLET | Freq: Every day | ORAL | 3 refills | Status: DC

## 2020-10-27 NOTE — Progress Notes (Signed)
10/27/2020 9:19 AM   Martha Henry 1965/11/18 [id number]  Referring provider: Volney American, PA-C [address] [place],  [place] [address]  Chief Complaint  Patient presents with   Urinary Incontinence    HPI: I was consulted to assist the patient is urinary incontinence.  She has had this for many years and saw another urologist Dr. Jacqlyn Larsen.  He has her on imipramine that may or may not be helping.   She can leak with coughing sneezing not bending lifting.  She has no urge incontinence.  She has no bedwetting.  When she finished the shower she can has some urgency and dribble a small amount of urine.  She wears 2 or 3 liners per day that are damp.   She voids every 1 hour and cannot hold it for 2 hours.  Gets up once or twice at night   She was having some pressure in the lower abdomen but this settled down and she describes her recent negative urinalysis   Mild grade 2 hypermobility the bladder neck and no stress incontinence.  Tissue is a bit elastic.  Asymptomatic distal grade 1 rectocele   Patient saw Dr. Jacqlyn Larsen for years with mild stress incontinence which she currently has.  She does have I believe perhaps some urgency associated leakage in the shower.  She does have a frequent bladder voiding every 1 hour with mild nocturia.  The role of trying some other medication change urodynamics discussed.     I was not surprised that the patient's not sure if she wants to proceed with urodynamics but at the same time does not want to live with the problem.  She took my advice and I will see her back in 6 weeks on Myrbetriq samples and prescription.  We can always order the test pending results and goals.   Mild stress incontinence a bit better Much less frequency and not leaking "" all the time small amounts she is very pleased.     Urge incontinence much better.  Still leaks with cough or sneeze but intermittent.  Were not can pursue other therapies.  90x3 sent to pharmacy.   See in a year.  Clinically not infected  Today v Frequency stable.  Takes a little bit more time to void.  No infections.  No leaking in shower.  Incontinence much better.   PMH: Past Medical History:  Diagnosis Date   COVID-19 07/2019   GERD (gastroesophageal reflux disease)    Headache    sinus   History of abnormal mammogram    seen by Dr. Jamal Collin   Hypothyroidism    ITP (idiopathic thrombocytopenic purpura) 1990   no issues in several years   Low back pain    Tubular adenoma of colon 2016   Tubular adenoma of colon    Urinary incontinence    Wears contact lenses     Surgical History: Past Surgical History:  Procedure Laterality Date   ABDOMINAL HYSTERECTOMY  2003   partial/only ovaries remain   APPENDECTOMY  2009   BREAST CYST ASPIRATION Left 2019   CHOLECYSTECTOMY  2009   COLONOSCOPY WITH PROPOFOL N/A 02/26/2015   Procedure: COLONOSCOPY WITH PROPOFOL;  Surgeon: Christene Lye, MD;  Location: [facility];  Service: Endoscopy;  Laterality: N/A;   COLONOSCOPY WITH PROPOFOL N/A 06/13/2020   Procedure: COLONOSCOPY WITH PROPOFOL;  Surgeon: Robert Bellow, MD;  Location: [facility];  Service: Endoscopy;  Laterality: N/A;   NASAL TURBINATE REDUCTION Bilateral 05/02/2015  Procedure: TURBINATE REDUCTION/SUBMUCOSAL RESECTION;  Surgeon: Beverly Gust, MD;  Location: [place];  Service: ENT;  Laterality: Bilateral;   SEPTOPLASTY N/A 05/02/2015   Procedure: SEPTOPLASTY;  Surgeon: Beverly Gust, MD;  Location: [place];  Service: ENT;  Laterality: N/A;   TONSILLECTOMY  1975    Home Medications:  Allergies as of 10/27/2020       Reactions   Erythromycin Other (See Comments)   Chest pain   Aspirin Other (See Comments)   Platelet problems   Ciprofloxacin Other (See Comments)   constipation        Medication List        Accurate as of October 27, 2020  9:19 AM. If you have any questions, ask your nurse or doctor.           Calcium-Vitamin D-Vitamin K [id number] MG-UNT-MCG Chew Chew by mouth.   cetirizine 10 MG tablet Commonly known as: ZYRTEC Take 10 mg by mouth daily.   fluticasone 50 MCG/ACT nasal spray Commonly known as: FLONASE USE 2 SPRAY(S) IN EACH NOSTRIL TWICE DAILY   lansoprazole 30 MG capsule Commonly known as: Prevacid Take 1 capsule (30 mg total) by mouth daily.   levothyroxine 75 MCG tablet Commonly known as: SYNTHROID Take by mouth.   levothyroxine 50 MCG tablet Commonly known as: SYNTHROID Take 1 tablet by mouth once daily   mirabegron ER 50 MG Tb24 tablet Commonly known as: MYRBETRIQ Take 1 tablet (50 mg total) by mouth daily.   montelukast 10 MG tablet Commonly known as: SINGULAIR TAKE 1 TABLET BY MOUTH AT BEDTIME   multivitamin with minerals tablet Take by mouth.   Turmeric Curcumin 500 MG Caps Take by mouth daily.   VITAMIN B-12 PO Take by mouth daily.        Allergies:  Allergies  Allergen Reactions   Erythromycin Other (See Comments)    Chest pain   Aspirin Other (See Comments)    Platelet problems   Ciprofloxacin Other (See Comments)    constipation    Family History: Family History  Problem Relation Age of Onset   Cancer Mother 45       breast   Breast cancer Mother 81   Colon cancer Father 54   Cancer Father        colon   Breast cancer Maternal Aunt    Cancer Maternal Aunt        breast   Ovarian cancer Maternal Aunt        Stage 4   Lung cancer Maternal Grandfather    Cervical cancer Maternal Grandmother     Social History:  reports that she has never smoked. She has never used smokeless tobacco. She reports that she does not drink alcohol and does not use drugs.  ROS:                                        Physical Exam: BP (!) 147/93   Pulse 96   Ht 5\' 1"  (1.549 m)   Wt 74.8 kg   BMI 31.18 kg/m   Constitutional:  Alert and oriented, No acute distress. HEENT: [place] AT, moist mucus membranes.   Trachea midline, no masses. Cardiovascular: No clubbing, cyanosis, or edema.   Laboratory Data: Lab Results  Component Value Date   WBC 8.6 08/29/2020   HGB 13.5 08/29/2020   HCT 42.4 08/29/2020   MCV 82 08/29/2020  PLT 365 08/29/2020    Lab Results  Component Value Date   CREATININE 0.76 08/29/2020    No results found for: PSA  No results found for: TESTOSTERONE  No results found for: HGBA1C  Urinalysis    Component Value Date/Time   APPEARANCEUR Clear 03/18/2020 0934   GLUCOSEU Negative 03/18/2020 0934   BILIRUBINUR Negative 03/18/2020 0934   PROTEINUR Negative 03/18/2020 0934   NITRITE Negative 03/18/2020 0934   LEUKOCYTESUR Negative 03/18/2020 0934    Pertinent Imaging:   Assessment & Plan: Myrbetriq 90x3 sent to pharmacy and I will see her in 1 year  There are no diagnoses linked to this encounter.  No follow-ups on file.  Reece Packer, MD  [facility] [address], [place], [place] [address] [phone]

## 2020-10-30 ENCOUNTER — Other Ambulatory Visit: Payer: Self-pay

## 2020-10-30 ENCOUNTER — Other Ambulatory Visit: Payer: Self-pay | Admitting: Family Medicine

## 2020-10-30 ENCOUNTER — Encounter: Payer: Self-pay | Admitting: Family Medicine

## 2020-10-30 MED ORDER — LEVOTHYROXINE SODIUM 50 MCG PO TABS: 50.0000 ug | ORAL_TABLET | Freq: Every day | ORAL | 1 refills | Status: DC

## 2020-11-04 DIAGNOSIS — M5414 Radiculopathy, thoracic region: Secondary | ICD-10-CM | POA: Diagnosis not present

## 2020-11-04 DIAGNOSIS — R519 Headache, unspecified: Secondary | ICD-10-CM | POA: Diagnosis not present

## 2020-11-04 DIAGNOSIS — M9901 Segmental and somatic dysfunction of cervical region: Secondary | ICD-10-CM | POA: Diagnosis not present

## 2020-11-04 DIAGNOSIS — M9902 Segmental and somatic dysfunction of thoracic region: Secondary | ICD-10-CM | POA: Diagnosis not present

## 2020-11-04 MED ORDER — LEVOTHYROXINE SODIUM 75 MCG PO TABS: 75.0000 ug | ORAL_TABLET | Freq: Every day | ORAL | 1 refills | Status: DC

## 2020-11-04 NOTE — Telephone Encounter (Signed)
Looks like she's yours

## 2020-11-25 ENCOUNTER — Other Ambulatory Visit: Payer: Self-pay | Admitting: Nurse Practitioner

## 2020-11-25 ENCOUNTER — Other Ambulatory Visit: Payer: Self-pay | Admitting: Family Medicine

## 2020-11-25 ENCOUNTER — Other Ambulatory Visit: Payer: Self-pay

## 2020-11-25 ENCOUNTER — Other Ambulatory Visit: Payer: Self-pay | Admitting: *Deleted

## 2020-11-25 DIAGNOSIS — Z1231 Encounter for screening mammogram for malignant neoplasm of breast: Secondary | ICD-10-CM

## 2020-11-26 DIAGNOSIS — M5414 Radiculopathy, thoracic region: Secondary | ICD-10-CM | POA: Diagnosis not present

## 2020-11-26 DIAGNOSIS — M9901 Segmental and somatic dysfunction of cervical region: Secondary | ICD-10-CM | POA: Diagnosis not present

## 2020-11-26 DIAGNOSIS — M9902 Segmental and somatic dysfunction of thoracic region: Secondary | ICD-10-CM | POA: Diagnosis not present

## 2020-11-26 DIAGNOSIS — R519 Headache, unspecified: Secondary | ICD-10-CM | POA: Diagnosis not present

## 2020-12-10 ENCOUNTER — Ambulatory Visit
Admission: RE | Admit: 2020-12-10 | Discharge: 2020-12-10 | Disposition: A | Discharge status: Home / Self Care | Payer: BC Managed Care – PPO | Source: Ambulatory Visit | Attending: Family Medicine | Admitting: Family Medicine

## 2020-12-10 ENCOUNTER — Other Ambulatory Visit: Payer: Self-pay

## 2020-12-10 DIAGNOSIS — Z1231 Encounter for screening mammogram for malignant neoplasm of breast: Secondary | ICD-10-CM | POA: Insufficient documentation

## 2020-12-17 DIAGNOSIS — M5414 Radiculopathy, thoracic region: Secondary | ICD-10-CM | POA: Diagnosis not present

## 2020-12-17 DIAGNOSIS — M9901 Segmental and somatic dysfunction of cervical region: Secondary | ICD-10-CM | POA: Diagnosis not present

## 2020-12-17 DIAGNOSIS — M9902 Segmental and somatic dysfunction of thoracic region: Secondary | ICD-10-CM | POA: Diagnosis not present

## 2020-12-17 DIAGNOSIS — R519 Headache, unspecified: Secondary | ICD-10-CM | POA: Diagnosis not present

## 2020-12-21 ENCOUNTER — Other Ambulatory Visit: Payer: Self-pay | Admitting: Family Medicine

## 2021-01-07 DIAGNOSIS — M9901 Segmental and somatic dysfunction of cervical region: Secondary | ICD-10-CM | POA: Diagnosis not present

## 2021-01-07 DIAGNOSIS — M25511 Pain in right shoulder: Secondary | ICD-10-CM | POA: Diagnosis not present

## 2021-01-07 DIAGNOSIS — M9902 Segmental and somatic dysfunction of thoracic region: Secondary | ICD-10-CM | POA: Diagnosis not present

## 2021-01-07 DIAGNOSIS — R519 Headache, unspecified: Secondary | ICD-10-CM | POA: Diagnosis not present

## 2021-01-07 DIAGNOSIS — M25531 Pain in right wrist: Secondary | ICD-10-CM | POA: Diagnosis not present

## 2021-01-07 DIAGNOSIS — M5414 Radiculopathy, thoracic region: Secondary | ICD-10-CM | POA: Diagnosis not present

## 2021-01-27 DIAGNOSIS — M9902 Segmental and somatic dysfunction of thoracic region: Secondary | ICD-10-CM | POA: Diagnosis not present

## 2021-01-27 DIAGNOSIS — M25511 Pain in right shoulder: Secondary | ICD-10-CM | POA: Diagnosis not present

## 2021-01-27 DIAGNOSIS — M25531 Pain in right wrist: Secondary | ICD-10-CM | POA: Diagnosis not present

## 2021-01-27 DIAGNOSIS — M5414 Radiculopathy, thoracic region: Secondary | ICD-10-CM | POA: Diagnosis not present

## 2021-01-27 DIAGNOSIS — M9901 Segmental and somatic dysfunction of cervical region: Secondary | ICD-10-CM | POA: Diagnosis not present

## 2021-01-27 DIAGNOSIS — R519 Headache, unspecified: Secondary | ICD-10-CM | POA: Diagnosis not present

## 2021-02-03 DIAGNOSIS — M25511 Pain in right shoulder: Secondary | ICD-10-CM | POA: Diagnosis not present

## 2021-02-03 DIAGNOSIS — R519 Headache, unspecified: Secondary | ICD-10-CM | POA: Diagnosis not present

## 2021-02-03 DIAGNOSIS — M5414 Radiculopathy, thoracic region: Secondary | ICD-10-CM | POA: Diagnosis not present

## 2021-02-03 DIAGNOSIS — M9902 Segmental and somatic dysfunction of thoracic region: Secondary | ICD-10-CM | POA: Diagnosis not present

## 2021-02-03 DIAGNOSIS — M9901 Segmental and somatic dysfunction of cervical region: Secondary | ICD-10-CM | POA: Diagnosis not present

## 2021-02-03 DIAGNOSIS — M25531 Pain in right wrist: Secondary | ICD-10-CM | POA: Diagnosis not present

## 2021-02-27 DIAGNOSIS — M5414 Radiculopathy, thoracic region: Secondary | ICD-10-CM | POA: Diagnosis not present

## 2021-02-27 DIAGNOSIS — M25531 Pain in right wrist: Secondary | ICD-10-CM | POA: Diagnosis not present

## 2021-02-27 DIAGNOSIS — M25511 Pain in right shoulder: Secondary | ICD-10-CM | POA: Diagnosis not present

## 2021-02-27 DIAGNOSIS — R519 Headache, unspecified: Secondary | ICD-10-CM | POA: Diagnosis not present

## 2021-02-27 DIAGNOSIS — M9901 Segmental and somatic dysfunction of cervical region: Secondary | ICD-10-CM | POA: Diagnosis not present

## 2021-02-27 DIAGNOSIS — M9902 Segmental and somatic dysfunction of thoracic region: Secondary | ICD-10-CM | POA: Diagnosis not present

## 2021-03-08 ENCOUNTER — Other Ambulatory Visit: Payer: Self-pay | Admitting: Nurse Practitioner

## 2021-03-08 ENCOUNTER — Other Ambulatory Visit: Payer: Self-pay | Admitting: Family Medicine

## 2021-03-08 NOTE — Telephone Encounter (Signed)
Requested Prescriptions  Pending Prescriptions Disp Refills  . montelukast (SINGULAIR) 10 MG tablet [Pharmacy Med Name: Montelukast Sodium 10 MG Oral Tablet] 90 tablet 0    Sig: TAKE 1 TABLET BY MOUTH AT BEDTIME     Pulmonology:  Leukotriene Inhibitors Passed - 03/08/2021  7:22 AM      Passed - Valid encounter within last 12 months    Recent Outpatient Visits          6 months ago Fatigue, unspecified type   [place], Lauren A, NP   6 months ago Fatigue, unspecified type   [place], NP   11 months ago Routine general medical examination at a health care facility   [place], [place], DO   1 year ago COVID-19 virus infection   [place], [place], [place]   1 year ago Lab test positive for detection of COVID-19 virus   [facility] [place], Barbaraann Faster, NP      Future Appointments            In 8 months [place], Nicki Reaper, [place]

## 2021-03-08 NOTE — Telephone Encounter (Signed)
Requested Prescriptions  Pending Prescriptions Disp Refills  . lansoprazole (PREVACID) 30 MG capsule [Pharmacy Med Name: Lansoprazole 30 MG Oral Capsule Delayed Release] 90 capsule 0    Sig: Take 1 capsule by mouth once daily     Gastroenterology: Proton Pump Inhibitors Passed - 03/08/2021  7:22 AM      Passed - Valid encounter within last 12 months    Recent Outpatient Visits          6 months ago Fatigue, unspecified type   [place], Lauren A, NP   6 months ago Fatigue, unspecified type   [place], NP   11 months ago Routine general medical examination at a health care facility   [place], [place], DO   1 year ago COVID-19 virus infection   [place], [place], [place]   1 year ago Lab test positive for detection of COVID-19 virus   [facility] [place], Barbaraann Faster, NP      Future Appointments            In 8 months MacDiarm[place], Nicki Reaper, [place]

## 2021-03-19 NOTE — Progress Notes (Signed)
BP 102/69   Pulse 65   Temp 98.3 F (36.8 C) (Oral)   Ht 5' 1.75" (1.568 m)   Wt 165 lb (74.8 kg)   SpO2 98%   BMI 30.42 kg/m    Subjective:    Patient ID: Martha Henry, female    DOB: [birth year], 55 y.o.   MRN: [id number]  Chief Complaint  Patient presents with   Annual Exam    Patient is here for annual exam. Patient states she would like to discuss something regarding her visit back in May with provider.    HPI: Martha Henry is a 55 y.o. female presenting on 03/20/2021 for comprehensive medical examination. Current medical complaints include:none  She currently lives with: husband, youngest son Menopausal Symptoms: no  Depression Screen done today and results listed below:  Depression screen [facility] 2/9 03/20/2021 09/09/2020 08/29/2020 03/05/2019 02/22/2018  Decreased Interest 0 0 0 0 0  Down, Depressed, Hopeless 0 0 0 0 0  PHQ - 2 Score 0 0 0 0 0  Altered sleeping 2 - - - 0  Tired, decreased energy 2 - - - 0  Change in appetite 0 - - - 0  Feeling bad or failure about yourself  0 - - - -  Trouble concentrating 0 - - - 0  Moving slowly or fidgety/restless 0 - - - 0  Suicidal thoughts 0 - - - 0  PHQ-9 Score 4 - - - 0  Difficult doing work/chores Not difficult at all - - - -    The patient does not have a history of falls. I did not complete a risk assessment for falls. A plan of care for falls was not documented.   Past Medical History:  Past Medical History:  Diagnosis Date   COVID-19 07/2019   GERD (gastroesophageal reflux disease)    Headache    sinus   History of abnormal mammogram    seen by Dr. Jamal Collin   Hypothyroidism    ITP (idiopathic thrombocytopenic purpura) 1990   no issues in several years   Low back pain    Tubular adenoma of colon 2016   Tubular adenoma of colon    Urinary incontinence    Wears contact lenses     Surgical History:  Past Surgical History:  Procedure Laterality Date   ABDOMINAL HYSTERECTOMY  2003    partial/only ovaries remain   APPENDECTOMY  2009   BREAST CYST ASPIRATION Left 2019   CHOLECYSTECTOMY  2009   COLONOSCOPY WITH PROPOFOL N/A 02/26/2015   Procedure: COLONOSCOPY WITH PROPOFOL;  Surgeon: Christene Lye, MD;  Location: [facility];  Service: Endoscopy;  Laterality: N/A;   COLONOSCOPY WITH PROPOFOL N/A 06/13/2020   Procedure: COLONOSCOPY WITH PROPOFOL;  Surgeon: Robert Bellow, MD;  Location: [facility];  Service: Endoscopy;  Laterality: N/A;   NASAL TURBINATE REDUCTION Bilateral 05/02/2015   Procedure: TURBINATE REDUCTION/SUBMUCOSAL RESECTION;  Surgeon: Beverly Gust, MD;  Location: [place];  Service: ENT;  Laterality: Bilateral;   SEPTOPLASTY N/A 05/02/2015   Procedure: SEPTOPLASTY;  Surgeon: Beverly Gust, MD;  Location: [place];  Service: ENT;  Laterality: N/A;   TONSILLECTOMY  1975    Medications:  Current Outpatient Medications on File Prior to Visit  Medication Sig   Calcium-Vitamin D-Vitamin K [id number] MG-UNT-MCG CHEW Chew by mouth.   cetirizine (ZYRTEC) 10 MG tablet Take 10 mg by mouth daily.   Cyanocobalamin (VITAMIN B-12 PO) Take by mouth daily.   fluticasone (FLONASE) 50 MCG/ACT  nasal spray USE 2 SPRAY(S) IN EACH NOSTRIL TWICE DAILY   lansoprazole (PREVACID) 30 MG capsule Take 1 capsule by mouth once daily   levothyroxine (SYNTHROID) 50 MCG tablet Take 1 tablet (50 mcg total) by mouth daily.   levothyroxine (SYNTHROID) 75 MCG tablet Take 1 tablet (75 mcg total) by mouth daily before breakfast.   mirabegron ER (MYRBETRIQ) 50 MG TB24 tablet Take 1 tablet (50 mg total) by mouth daily.   montelukast (SINGULAIR) 10 MG tablet TAKE 1 TABLET BY MOUTH AT BEDTIME   Multiple Vitamins-Minerals (MULTIVITAMIN WITH MINERALS) tablet Take by mouth.   Turmeric Curcumin 500 MG CAPS Take by mouth daily.    No current facility-administered medications on file prior to visit.    Allergies:  Allergies  Allergen Reactions    Erythromycin Other (See Comments)    Chest pain   Aspirin Other (See Comments)    Platelet problems   Ciprofloxacin Other (See Comments)    constipation    Social History:  Social History   Socioeconomic History   Marital status: Married    Spouse name: Not on file   Number of children: Not on file   Years of education: Not on file   Highest education level: Not on file  Occupational History   Not on file  Tobacco Use   Smoking status: Never   Smokeless tobacco: Never  Vaping Use   Vaping Use: Never used  Substance and Sexual Activity   Alcohol use: No   Drug use: No   Sexual activity: Yes  Other Topics Concern   Not on file  Social History Narrative   Not on file   Social Determinants of Health   Financial Resource Strain: Not on file  Food Insecurity: Not on file  Transportation Needs: Not on file  Physical Activity: Not on file  Stress: Not on file  Social Connections: Not on file  Intimate Partner Violence: Not on file   Social History   Tobacco Use  Smoking Status Never  Smokeless Tobacco Never   Social History   Substance and Sexual Activity  Alcohol Use No    Family History:  Family History  Problem Relation Age of Onset   Cancer Mother 54       breast   Breast cancer Mother 65   Colon cancer Father 64   Cancer Father        colon   Breast cancer Maternal Aunt    Cancer Maternal Aunt        breast   Ovarian cancer Maternal Aunt        Stage 4   Lung cancer Maternal Grandfather    Cervical cancer Maternal Grandmother     Past medical history, surgical history, medications, allergies, family history and social history reviewed with patient today and changes made to appropriate areas of the chart.   Review of Systems  Constitutional:  Positive for malaise/fatigue (intermittent).  HENT: Negative.    Eyes: Negative.   Respiratory: Negative.    Cardiovascular: Negative.   Gastrointestinal: Negative.   Genitourinary:  Positive for  frequency and urgency. Negative for dysuria.  Musculoskeletal:  Positive for joint pain (comes and goes).  Skin: Negative.   Neurological: Negative.   Psychiatric/Behavioral: Negative.    All other ROS negative except what is listed above and in the HPI.      Objective:    BP 102/69   Pulse 65   Temp 98.3 F (36.8 C) (Oral)   Ht 5'  1.75" (1.568 m)   Wt 165 lb (74.8 kg)   SpO2 98%   BMI 30.42 kg/m   Wt Readings from Last 3 Encounters:  03/20/21 165 lb (74.8 kg)  10/27/20 165 lb (74.8 kg)  09/09/20 165 lb 3.2 oz (74.9 kg)    Physical Exam Vitals and nursing note reviewed.  Constitutional:      General: She is not in acute distress.    Appearance: Normal appearance.  HENT:     Head: Normocephalic and atraumatic.     Right Ear: Tympanic membrane, ear canal and external ear normal.     Left Ear: Tympanic membrane, ear canal and external ear normal.     Nose: Nose normal.     Mouth/Throat:     Mouth: Mucous membranes are moist.     Pharynx: Oropharynx is clear.  Eyes:     Conjunctiva/sclera: Conjunctivae normal.  Cardiovascular:     Rate and Rhythm: Normal rate and regular rhythm.     Pulses: Normal pulses.     Heart sounds: Normal heart sounds.  Pulmonary:     Effort: Pulmonary effort is normal.     Breath sounds: Normal breath sounds.  Abdominal:     General: Bowel sounds are normal.     Palpations: Abdomen is soft.     Tenderness: There is no abdominal tenderness.  Musculoskeletal:        General: Normal range of motion.     Cervical back: Normal range of motion and neck supple. No tenderness.     Comments: Strength 5/5 in bilateral upper and lower extremities   Lymphadenopathy:     Cervical: No cervical adenopathy.  Skin:    General: Skin is warm and dry.  Neurological:     General: No focal deficit present.     Mental Status: She is alert and oriented to person, place, and time.     Cranial Nerves: No cranial nerve deficit.     Coordination: Coordination  normal.     Gait: Gait normal.  Psychiatric:        Mood and Affect: Mood normal.        Behavior: Behavior normal.        Thought Content: Thought content normal.        Judgment: Judgment normal.    Results for orders placed or performed in visit on 08/29/20  Comp Met (CMET)  Result Value Ref Range   Glucose 76 65 - 99 mg/dL   BUN 10 6 - 24 mg/dL   Creatinine, Ser 0.76 0.57 - 1.00 mg/dL   eGFR 94 >59 mL/min/1.73   BUN/Creatinine Ratio 13 9 - 23   Sodium 140 134 - 144 mmol/L   Potassium 4.3 3.5 - 5.2 mmol/L   Chloride 97 96 - 106 mmol/L   CO2 21 20 - 29 mmol/L   Calcium 9.7 8.7 - 10.2 mg/dL   Total Protein 7.1 6.0 - 8.5 g/dL   Albumin 4.4 3.8 - 4.9 g/dL   Globulin, Total 2.7 1.5 - 4.5 g/dL   Albumin/Globulin Ratio 1.6 1.2 - 2.2   Bilirubin Total 0.4 0.0 - 1.2 mg/dL   Alkaline Phosphatase 95 44 - 121 IU/L   AST 24 0 - 40 IU/L   ALT 19 0 - 32 IU/L  Vitamin B12  Result Value Ref Range   Vitamin B-12 1,053 232 - 1,245 pg/mL  CBC w/Diff  Result Value Ref Range   WBC 8.6 3.4 - 10.8 x10E3/uL   RBC 5.16  3.77 - 5.28 x10E6/uL   Hemoglobin 13.5 11.1 - 15.9 g/dL   Hematocrit 42.4 34.0 - 46.6 %   MCV 82 79 - 97 fL   MCH 26.2 (L) 26.6 - 33.0 pg   MCHC 31.8 31.5 - 35.7 g/dL   RDW 12.9 11.7 - 15.4 %   Platelets 365 150 - 450 x10E3/uL   Neutrophils 52 Not Estab. %   Lymphs 38 Not Estab. %   Monocytes 7 Not Estab. %   Eos 2 Not Estab. %   Basos 1 Not Estab. %   Neutrophils Absolute 4.5 1.4 - 7.0 x10E3/uL   Lymphocytes Absolute 3.2 (H) 0.7 - 3.1 x10E3/uL   Monocytes Absolute 0.6 0.1 - 0.9 x10E3/uL   EOS (ABSOLUTE) 0.2 0.0 - 0.4 x10E3/uL   Basophils Absolute 0.1 0.0 - 0.2 x10E3/uL   Immature Granulocytes 0 Not Estab. %   Immature Grans (Abs) 0.0 0.0 - 0.1 x10E3/uL  TSH  Result Value Ref Range   TSH 3.270 0.450 - 4.500 uIU/mL  Vitamin D (25 hydroxy)  Result Value Ref Range   Vit D, 25-Hydroxy 40.6 30.0 - 100.0 ng/mL  Lyme disease, western blot  Result Value Ref Range      IgG P93 Ab. Absent      IgG P66 Ab. Absent      IgG P58 Ab. Absent      IgG P45 Ab. Absent      IgG P41 Ab. Present (A)      IgG P39 Ab. Absent      IgG P30 Ab. Absent      IgG P28 Ab. Absent      IgG P23 Ab. Absent      IgG P18 Ab. Absent    Lyme IgG Wb Negative      IgM P41 Ab. Absent      IgM P39 Ab. Absent      IgM P23 Ab. Absent    Lyme IgM Wb Negative   Rocky mtn spotted fvr abs pnl(IgG+IgM)  Result Value Ref Range   RMSF IgG Negative Negative   RMSF IgM 0.36 0.00 - 0.89 index  Lyme Disease Serology w/Reflex  Result Value Ref Range   Lyme Total Antibody EIA Negative Negative      Assessment & Plan:   Problem List Items Addressed This Visit       Endocrine   Hypothyroidism    Check TSH today and adjust regimen as needed.       Relevant Orders   TSH   Other Visit Diagnoses     Routine general medical examination at a health care facility    -  Primary   Health maintenance reviewed. She will check with her insurance to see where the shingrix vaccine is covered. Check CMP, CBC today.    Relevant Orders   Comprehensive metabolic panel   CBC with Differential/Platelet   Encounter for lipid screening for cardiovascular disease       Check lipid panel today. Treat based on results.    Relevant Orders   Lipid Panel w/o Chol/HDL Ratio   Need for influenza vaccination       Flu vaccine given today   Relevant Orders   Flu Vaccine QUAD 52moIM (Fluarix, Fluzone & Alfiuria Quad PF) (Completed)        Follow up plan: Return in about 6 months (around 09/17/2021) for thyroid.   LABORATORY TESTING:  - Pap smear: up to date  IMMUNIZATIONS:   - Tdap: Tetanus vaccination status  reviewed: last tetanus booster within 10 years. - Influenza: Administered today - Pneumovax: Not applicable - Prevnar: Not applicable - HPV: Not applicable - Zostavax vaccine:  will talk with insurance company to determine if she can get it at the office or  pharmacy  SCREENING: -Mammogram: Up to date  - Colonoscopy: Up to date  - Bone Density: Not applicable  -Hearing Test: Not applicable  -Spirometry: Not applicable   PATIENT COUNSELING:   Advised to take 1 mg of folate supplement per day if capable of pregnancy.   Sexuality: Discussed sexually transmitted diseases, partner selection, use of condoms, avoidance of unintended pregnancy  and contraceptive alternatives.   Advised to avoid cigarette smoking.  I discussed with the patient that most people either abstain from alcohol or drink within safe limits (<=14/week and <=4 drinks/occasion for males, <=7/weeks and <= 3 drinks/occasion for females) and that the risk for alcohol disorders and other health effects rises proportionally with the number of drinks per week and how often a drinker exceeds daily limits.  Discussed cessation/primary prevention of drug use and availability of treatment for abuse.   Diet: Encouraged to adjust caloric intake to maintain  or achieve ideal body weight, to reduce intake of dietary saturated fat and total fat, to limit sodium intake by avoiding high sodium foods and not adding table salt, and to maintain adequate dietary potassium and calcium preferably from fresh fruits, vegetables, and low-fat dairy products.    stressed the importance of regular exercise  Injury prevention: Discussed safety belts, safety helmets, smoke detector, smoking near bedding or upholstery.   Dental health: Discussed importance of regular tooth brushing, flossing, and dental visits.    NEXT PREVENTATIVE PHYSICAL DUE IN 1 YEAR. Return in about 6 months (around 09/17/2021) for thyroid.

## 2021-03-20 ENCOUNTER — Ambulatory Visit (INDEPENDENT_AMBULATORY_CARE_PROVIDER_SITE_OTHER): Payer: BC Managed Care – PPO | Admitting: Nurse Practitioner

## 2021-03-20 ENCOUNTER — Other Ambulatory Visit: Payer: Self-pay

## 2021-03-20 ENCOUNTER — Encounter: Payer: Self-pay | Admitting: Nurse Practitioner

## 2021-03-20 VITALS — BP 102/69 | HR 65 | Temp 98.3°F | Ht 61.75 in | Wt 165.0 lb

## 2021-03-20 DIAGNOSIS — R519 Headache, unspecified: Secondary | ICD-10-CM | POA: Diagnosis not present

## 2021-03-20 DIAGNOSIS — Z0000 Encounter for general adult medical examination without abnormal findings: Secondary | ICD-10-CM

## 2021-03-20 DIAGNOSIS — M5414 Radiculopathy, thoracic region: Secondary | ICD-10-CM | POA: Diagnosis not present

## 2021-03-20 DIAGNOSIS — Z136 Encounter for screening for cardiovascular disorders: Secondary | ICD-10-CM | POA: Diagnosis not present

## 2021-03-20 DIAGNOSIS — Z23 Encounter for immunization: Secondary | ICD-10-CM

## 2021-03-20 DIAGNOSIS — M25511 Pain in right shoulder: Secondary | ICD-10-CM | POA: Diagnosis not present

## 2021-03-20 DIAGNOSIS — Z13220 Encounter for screening for lipoid disorders: Secondary | ICD-10-CM | POA: Diagnosis not present

## 2021-03-20 DIAGNOSIS — M9901 Segmental and somatic dysfunction of cervical region: Secondary | ICD-10-CM | POA: Diagnosis not present

## 2021-03-20 DIAGNOSIS — E039 Hypothyroidism, unspecified: Secondary | ICD-10-CM

## 2021-03-20 DIAGNOSIS — M9902 Segmental and somatic dysfunction of thoracic region: Secondary | ICD-10-CM | POA: Diagnosis not present

## 2021-03-20 DIAGNOSIS — M25531 Pain in right wrist: Secondary | ICD-10-CM | POA: Diagnosis not present

## 2021-03-20 NOTE — Assessment & Plan Note (Signed)
Check TSH today and adjust regimen as needed.

## 2021-03-21 LAB — CBC WITH DIFFERENTIAL/PLATELET
Basophils Absolute: 0.1 10*3/uL (ref 0.0–0.2)
Basos: 1 %
EOS (ABSOLUTE): 0.2 10*3/uL (ref 0.0–0.4)
Eos: 2 %
Hematocrit: 40.9 % (ref 34.0–46.6)
Hemoglobin: 13.1 g/dL (ref 11.1–15.9)
Immature Grans (Abs): 0.1 10*3/uL (ref 0.0–0.1)
Immature Granulocytes: 1 %
Lymphocytes Absolute: 3.4 10*3/uL — ABNORMAL HIGH (ref 0.7–3.1)
Lymphs: 37 %
MCH: 25.9 pg — ABNORMAL LOW (ref 26.6–33.0)
MCHC: 32 g/dL (ref 31.5–35.7)
MCV: 81 fL (ref 79–97)
Monocytes Absolute: 0.6 10*3/uL (ref 0.1–0.9)
Monocytes: 7 %
Neutrophils Absolute: 4.9 10*3/uL (ref 1.4–7.0)
Neutrophils: 52 %
Platelets: 331 10*3/uL (ref 150–450)
RBC: 5.06 x10E6/uL (ref 3.77–5.28)
RDW: 12.9 % (ref 11.7–15.4)
WBC: 9.1 10*3/uL (ref 3.4–10.8)

## 2021-03-21 LAB — LIPID PANEL W/O CHOL/HDL RATIO
Cholesterol, Total: 186 mg/dL (ref 100–199)
HDL: 65 mg/dL (ref >39)
LDL Chol Calc (NIH): 102 mg/dL — ABNORMAL HIGH (ref 0–99)
Triglycerides: 109 mg/dL (ref 0–149)
VLDL Cholesterol Cal: 19 mg/dL (ref 5–40)

## 2021-03-21 LAB — COMPREHENSIVE METABOLIC PANEL
ALT: 24 IU/L (ref 0–32)
AST: 28 IU/L (ref 0–40)
Albumin/Globulin Ratio: 2.1 (ref 1.2–2.2)
Albumin: 4.6 g/dL (ref 3.8–4.9)
Alkaline Phosphatase: 89 IU/L (ref 44–121)
BUN/Creatinine Ratio: 10 (ref 9–23)
BUN: 9 mg/dL (ref 6–24)
Bilirubin Total: 0.4 mg/dL (ref 0.0–1.2)
CO2: 26 mmol/L (ref 20–29)
Calcium: 9.7 mg/dL (ref 8.7–10.2)
Chloride: 100 mmol/L (ref 96–106)
Creatinine, Ser: 0.87 mg/dL (ref 0.57–1.00)
Globulin, Total: 2.2 g/dL (ref 1.5–4.5)
Glucose: 80 mg/dL (ref 70–99)
Potassium: 4 mmol/L (ref 3.5–5.2)
Sodium: 139 mmol/L (ref 134–144)
Total Protein: 6.8 g/dL (ref 6.0–8.5)
eGFR: 80 mL/min/{1.73_m2} (ref >59)

## 2021-03-21 LAB — TSH: TSH: 3.15 u[IU]/mL (ref 0.450–4.500)

## 2021-04-29 DIAGNOSIS — R519 Headache, unspecified: Secondary | ICD-10-CM | POA: Diagnosis not present

## 2021-04-29 DIAGNOSIS — M5414 Radiculopathy, thoracic region: Secondary | ICD-10-CM | POA: Diagnosis not present

## 2021-04-29 DIAGNOSIS — M25531 Pain in right wrist: Secondary | ICD-10-CM | POA: Diagnosis not present

## 2021-04-29 DIAGNOSIS — M9902 Segmental and somatic dysfunction of thoracic region: Secondary | ICD-10-CM | POA: Diagnosis not present

## 2021-04-29 DIAGNOSIS — M9901 Segmental and somatic dysfunction of cervical region: Secondary | ICD-10-CM | POA: Diagnosis not present

## 2021-04-29 DIAGNOSIS — M25511 Pain in right shoulder: Secondary | ICD-10-CM | POA: Diagnosis not present

## 2021-05-20 DIAGNOSIS — M25531 Pain in right wrist: Secondary | ICD-10-CM | POA: Diagnosis not present

## 2021-05-20 DIAGNOSIS — M25511 Pain in right shoulder: Secondary | ICD-10-CM | POA: Diagnosis not present

## 2021-05-20 DIAGNOSIS — M9902 Segmental and somatic dysfunction of thoracic region: Secondary | ICD-10-CM | POA: Diagnosis not present

## 2021-05-20 DIAGNOSIS — R519 Headache, unspecified: Secondary | ICD-10-CM | POA: Diagnosis not present

## 2021-05-20 DIAGNOSIS — M5414 Radiculopathy, thoracic region: Secondary | ICD-10-CM | POA: Diagnosis not present

## 2021-05-20 DIAGNOSIS — M9901 Segmental and somatic dysfunction of cervical region: Secondary | ICD-10-CM | POA: Diagnosis not present

## 2021-06-03 ENCOUNTER — Ambulatory Visit: Payer: BC Managed Care – PPO | Admitting: Nurse Practitioner

## 2021-06-03 ENCOUNTER — Encounter: Payer: Self-pay | Admitting: Nurse Practitioner

## 2021-06-03 ENCOUNTER — Other Ambulatory Visit: Payer: Self-pay

## 2021-06-03 VITALS — BP 116/75 | HR 60 | Temp 98.3°F | Ht 61.75 in | Wt 163.4 lb

## 2021-06-03 DIAGNOSIS — J019 Acute sinusitis, unspecified: Secondary | ICD-10-CM | POA: Diagnosis not present

## 2021-06-03 DIAGNOSIS — J0110 Acute frontal sinusitis, unspecified: Secondary | ICD-10-CM

## 2021-06-03 MED ORDER — PREDNISONE 20 MG PO TABS: 40.0000 mg | ORAL_TABLET | Freq: Every day | ORAL | 0 refills | Status: AC

## 2021-06-03 MED ORDER — AMOXICILLIN-POT CLAVULANATE 875-125 MG PO TABS: 1.0000 | ORAL_TABLET | Freq: Two times a day (BID) | ORAL | 0 refills | Status: AC

## 2021-06-03 MED ORDER — FLUCONAZOLE 150 MG PO TABS: 150.0000 mg | ORAL_TABLET | Freq: Once | ORAL | 0 refills | Status: AC

## 2021-06-03 NOTE — Patient Instructions (Signed)

## 2021-06-03 NOTE — Progress Notes (Signed)
BP 116/75    Pulse 60    Temp 98.3 F (36.8 C) (Oral)    Ht 5' 1.75" (1.568 m)    Wt 163 lb 6.4 oz (74.1 kg)    SpO2 97%    BMI 30.13 kg/m    Subjective:    Patient ID: Martha Henry, female    DOB: [birth year], 56 y.o.   MRN: [id number]  HPI: Martha Henry is a 56 y.o. female  Chief Complaint  Patient presents with   Sinus Problem    Patient states she has been flushing her sinus for the pass week with nasal rinses. Patient states headaches, facial pressure, sore throat, eye pain and neck pain. Patient states she has had drainage and then she has some dryness. Patient states she has tried Mucinex and Day-Quil/Nyquil. Patient denies having any fever or coughing.    UPPER RESPIRATORY TRACT INFECTION Started with symptoms 2 weeks ago.  Has been sinus rinses without benefit.  Last infection 05/21/2019 - treated with Augmentin and Diflucan (gets yeast infection with abx) + Prednisone. Worst symptom: headache Fever: no Cough: no Shortness of breath: no Wheezing: no Chest pain: no Chest tightness: no Chest congestion: no Nasal congestion: yes Runny nose: yes Post nasal drip: yes Sneezing: no Sore throat:  scratchy Swollen glands: no Sinus pressure: yes Headache: yes Face pain: yes Toothache: yes Ear pain: yes bilateral Ear pressure: yes bilateral Eyes red/itching:no Eye drainage/crusting: no  Vomiting: no Rash: no Fatigue: yes Sick contacts: no Strep contacts: no  Context: fluctuating Recurrent sinusitis: no Relief with OTC cold/cough medications: no  Treatments attempted:  Tylenol, cold/sinus, and mucinex    Relevant past medical, surgical, family and social history reviewed and updated as indicated. Interim medical history since our last visit reviewed. Allergies and medications reviewed and updated.  Review of Systems  Constitutional:  Positive for fatigue. Negative for activity change, appetite change, chills and fever.  HENT:  Positive for  congestion, postnasal drip, rhinorrhea, sinus pressure, sinus pain and sore throat. Negative for ear discharge, ear pain, facial swelling, sneezing and voice change.   Eyes:  Negative for pain.  Respiratory: Negative.    Cardiovascular:  Negative for chest pain, palpitations and leg swelling.  Gastrointestinal: Negative.   Endocrine: Negative.   Neurological:  Positive for headaches. Negative for dizziness, weakness and numbness.  Psychiatric/Behavioral: Negative.     Per HPI unless specifically indicated above     Objective:    BP 116/75    Pulse 60    Temp 98.3 F (36.8 C) (Oral)    Ht 5' 1.75" (1.568 m)    Wt 163 lb 6.4 oz (74.1 kg)    SpO2 97%    BMI 30.13 kg/m   Wt Readings from Last 3 Encounters:  06/03/21 163 lb 6.4 oz (74.1 kg)  03/20/21 165 lb (74.8 kg)  10/27/20 165 lb (74.8 kg)    Physical Exam Vitals and nursing note reviewed.  Constitutional:      General: She is awake. She is not in acute distress.    Appearance: She is well-developed and well-groomed. She is obese. She is not ill-appearing or toxic-appearing.  HENT:     Head: Normocephalic.     Right Ear: Hearing, ear canal and external ear normal. A middle ear effusion is present.     Left Ear: Hearing, ear canal and external ear normal. A middle ear effusion is present.     Nose:     Right Turbinates: Enlarged.  Left Turbinates: Enlarged.     Right Sinus: Frontal sinus tenderness present. No maxillary sinus tenderness.     Left Sinus: Frontal sinus tenderness present. No maxillary sinus tenderness.     Mouth/Throat:     Mouth: Mucous membranes are moist.     Pharynx: Posterior oropharyngeal erythema (mild with cobblestone appearance) present. No pharyngeal swelling or oropharyngeal exudate.  Eyes:     General: Lids are normal.        Right eye: No discharge.        Left eye: No discharge.     Conjunctiva/sclera: Conjunctivae normal.     Pupils: Pupils are equal, round, and reactive to light.  Neck:      Thyroid: No thyromegaly.     Vascular: No carotid bruit.  Cardiovascular:     Rate and Rhythm: Normal rate and regular rhythm.     Heart sounds: Normal heart sounds. No murmur heard.   No gallop.  Pulmonary:     Effort: Pulmonary effort is normal. No accessory muscle usage or respiratory distress.     Breath sounds: Normal breath sounds.  Abdominal:     General: Bowel sounds are normal. There is no distension.     Palpations: Abdomen is soft.     Tenderness: There is no abdominal tenderness.  Musculoskeletal:     Cervical back: Normal range of motion and neck supple.     Right lower leg: No edema.     Left lower leg: No edema.  Lymphadenopathy:     Cervical: No cervical adenopathy.  Skin:    General: Skin is warm and dry.  Neurological:     Mental Status: She is alert and oriented to person, place, and time.  Psychiatric:        Attention and Perception: Attention normal.        Mood and Affect: Mood normal.        Speech: Speech normal.        Behavior: Behavior normal. Behavior is cooperative.        Thought Content: Thought content normal.    Results for orders placed or performed in visit on 03/20/21  Comprehensive metabolic panel  Result Value Ref Range   Glucose 80 70 - 99 mg/dL   BUN 9 6 - 24 mg/dL   Creatinine, Ser 0.87 0.57 - 1.00 mg/dL   eGFR 80 >59 mL/min/1.73   BUN/Creatinine Ratio 10 9 - 23   Sodium 139 134 - 144 mmol/L   Potassium 4.0 3.5 - 5.2 mmol/L   Chloride 100 96 - 106 mmol/L   CO2 26 20 - 29 mmol/L   Calcium 9.7 8.7 - 10.2 mg/dL   Total Protein 6.8 6.0 - 8.5 g/dL   Albumin 4.6 3.8 - 4.9 g/dL   Globulin, Total 2.2 1.5 - 4.5 g/dL   Albumin/Globulin Ratio 2.1 1.2 - 2.2   Bilirubin Total 0.4 0.0 - 1.2 mg/dL   Alkaline Phosphatase 89 44 - 121 IU/L   AST 28 0 - 40 IU/L   ALT 24 0 - 32 IU/L  CBC with Differential/Platelet  Result Value Ref Range   WBC 9.1 3.4 - 10.8 x10E3/uL   RBC 5.06 3.77 - 5.28 x10E6/uL   Hemoglobin 13.1 11.1 - 15.9 g/dL    Hematocrit 40.9 34.0 - 46.6 %   MCV 81 79 - 97 fL   MCH 25.9 (L) 26.6 - 33.0 pg   MCHC 32.0 31.5 - 35.7 g/dL   RDW 12.9 11.7 - 15.4 %  Platelets 331 150 - 450 x10E3/uL   Neutrophils 52 Not Estab. %   Lymphs 37 Not Estab. %   Monocytes 7 Not Estab. %   Eos 2 Not Estab. %   Basos 1 Not Estab. %   Neutrophils Absolute 4.9 1.4 - 7.0 x10E3/uL   Lymphocytes Absolute 3.4 (H) 0.7 - 3.1 x10E3/uL   Monocytes Absolute 0.6 0.1 - 0.9 x10E3/uL   EOS (ABSOLUTE) 0.2 0.0 - 0.4 x10E3/uL   Basophils Absolute 0.1 0.0 - 0.2 x10E3/uL   Immature Granulocytes 1 Not Estab. %   Immature Grans (Abs) 0.1 0.0 - 0.1 x10E3/uL  Lipid Panel w/o Chol/HDL Ratio  Result Value Ref Range   Cholesterol, Total 186 100 - 199 mg/dL   Triglycerides 109 0 - 149 mg/dL   HDL 65 >39 mg/dL   VLDL Cholesterol Cal 19 5 - 40 mg/dL   LDL Chol Calc (NIH) 102 (H) 0 - 99 mg/dL  TSH  Result Value Ref Range   TSH 3.150 0.450 - 4.500 uIU/mL      Assessment & Plan:   Problem List Items Addressed This Visit       Respiratory   Acute frontal sinusitis - Primary    Acute with no infection since 2021 -- current symptoms ongoing 2 weeks.  Obtain Covid testing today due to pandemic, but low suspicion for this.  Suspect more sinus infection present for 2 weeks.  At this time will send in Augmentin BID for 7 days, Prednisone 40 MG daily x 5 days, and Diflucan x 1 dose due to yeast risk with abx therapy. Recommend: - Increased rest - Increasing Fluids - Acetaminophen as needed for fever/pain.  - Salt water gargling, chloraseptic spray and throat lozenge. - Mucinex.  - Saline sinus flushes or a neti pot.  - Humidifying the air. Return to office for worsening or ongoing symptoms.      Relevant Medications   amoxicillin-clavulanate (AUGMENTIN) 875-125 MG tablet   predniSONE (DELTASONE) 20 MG tablet   fluconazole (DIFLUCAN) 150 MG tablet   Other Relevant Orders   Novel Coronavirus, NAA ([facility])     Follow up plan: Return if  symptoms worsen or fail to improve.

## 2021-06-03 NOTE — Assessment & Plan Note (Signed)
Acute with no infection since 2021 -- current symptoms ongoing 2 weeks.  Obtain Covid testing today due to pandemic, but low suspicion for this.  Suspect more sinus infection present for 2 weeks.  At this time will send in Augmentin BID for 7 days, Prednisone 40 MG daily x 5 days, and Diflucan x 1 dose due to yeast risk with abx therapy. Recommend: - Increased rest - Increasing Fluids - Acetaminophen as needed for fever/pain.  - Salt water gargling, chloraseptic spray and throat lozenge. - Mucinex.  - Saline sinus flushes or a neti pot.  - Humidifying the air. Return to office for worsening or ongoing symptoms.

## 2021-06-04 ENCOUNTER — Telehealth: Payer: BC Managed Care – PPO | Admitting: Nurse Practitioner

## 2021-06-05 LAB — SARS-COV-2, NAA 2 DAY TAT

## 2021-06-05 LAB — NOVEL CORONAVIRUS, NAA: SARS-CoV-2, NAA: NOT DETECTED

## 2021-06-05 NOTE — Progress Notes (Signed)
Contacted via [facility], your Covid testing returned negative:)

## 2021-06-10 ENCOUNTER — Ambulatory Visit: Payer: Self-pay

## 2021-06-10 NOTE — Telephone Encounter (Signed)
Patient can make an appt to discuss symptoms.

## 2021-06-10 NOTE — Telephone Encounter (Signed)
Virtual visit scheduled.  

## 2021-06-10 NOTE — Telephone Encounter (Signed)
Summary: stomach discomfort   The patient has been taking a round of antibiotics for the past week, their final pill has to be taken this afternoon   The patient has been experiencing stomach discomfort including diarrhea and vomiting today 06/10/21   Please contact to discuss further when possible      Chief Complaint: Started having diarrhea and nausea today. Has 1 more Augmentin to take. Still having sinus pain/pressure as well. Symptoms: Diarrhea x 3 today. Watery. Frequency: Started today Pertinent Negatives: Patient denies  Disposition: [] ED /[] Urgent Care (no appt availability in office) / [] Appointment(In office/virtual)/ []  [facility]/ [] Home Care/ [] Refused Recommended Disposition /[] [place] [facility]/ []  Follow-up with PCP Additional Notes: Asking what take for diarrhea and concerned she is not completely over the sinus infection. Please advise pt.  Answer Assessment - Initial Assessment Questions 1. ANTIBIOTIC: "What antibiotic are you taking?" "How many times per day?"     Augmentin 2. ANTIBIOTIC ONSET: "When was the antibiotic started?"     Last pill today 3. DIARRHEA SEVERITY: "How bad is the diarrhea?" "How many more stools have you had in the past 24 hours than normal?"    - NO DIARRHEA (SCALE 0)   - MILD (SCALE 1-3): Few loose or mushy BMs; increase of 1-3 stools over normal daily number of stools; mild increase in ostomy output.   -  MODERATE (SCALE 4-7): Increase of 4-6 stools daily over normal; moderate increase in ostomy output. * SEVERE (SCALE 8-10; OR 'WORST POSSIBLE'): Increase of 7 or more stools daily over normal; moderate increase in ostomy output; incontinence.     3 4. ONSET: "When did the diarrhea begin?"      Today 5. BM CONSISTENCY: "How loose or watery is the diarrhea?"      Watery 6. VOMITING: "Are you also vomiting?" If Yes, ask: "How many times in the past 24 hours?"      No, but nausea 7. ABDOMINAL PAIN: "Are you having any  abdominal pain?" If Yes, ask: "What does it feel like?" (e.g., crampy, dull, intermittent, constant)      Mild 8. ABDOMINAL PAIN SEVERITY: If present, ask: "How bad is the pain?"  (e.g., Scale 1-10; mild, moderate, or severe)   - MILD (1-3): doesn't interfere with normal activities, abdomen soft and not tender to touch    - MODERATE (4-7): interferes with normal activities or awakens from sleep, abdomen tender to touch    - SEVERE (8-10): excruciating pain, doubled over, unable to do any normal activities       Mild 9. ORAL INTAKE: If vomiting, "Have you been able to drink liquids?" "How much liquids have you had in the past 24 hours?"     Yes 10. HYDRATION: "Any signs of dehydration?" (e.g., dry mouth [not just dry lips], too weak to stand, dizziness, new weight loss) "When did you last urinate?"       No 11. EXPOSURE: "Have you traveled to a foreign country recently?" "Have you been exposed to anyone with diarrhea?" "Could you have eaten any food that was spoiled?"       No 12. OTHER SYMPTOMS: "Do you have any other symptoms?" (e.g., fever, blood in stool)       Still having sinus pain, pressure 13. PREGNANCY: "Is there any chance you are pregnant?" "When was your last menstrual period?"       No  Protocols used: Diarrhea on Antibiotics-A-AH

## 2021-06-11 ENCOUNTER — Encounter: Payer: Self-pay | Admitting: Nurse Practitioner

## 2021-06-11 ENCOUNTER — Telehealth (INDEPENDENT_AMBULATORY_CARE_PROVIDER_SITE_OTHER): Payer: BC Managed Care – PPO | Admitting: Nurse Practitioner

## 2021-06-11 DIAGNOSIS — R110 Nausea: Secondary | ICD-10-CM | POA: Diagnosis not present

## 2021-06-11 MED ORDER — ONDANSETRON HCL 4 MG PO TABS: 4.0000 mg | ORAL_TABLET | Freq: Three times a day (TID) | ORAL | 0 refills | Status: DC | PRN

## 2021-06-11 NOTE — Progress Notes (Signed)
There were no vitals taken for this visit.   Subjective:    Patient ID: Martha Henry, female    DOB: [birth year], 56 y.o.   MRN: [id number]  HPI: Martha Henry is a 56 y.o. female  Chief Complaint  Patient presents with   Nausea    Pt states she recently finished taking an antibiotic yesterday. States she was having some diarrhea yesterday, none today. States today she has been nauseous and having abdominal pain   Pt states she recently finished taking an antibiotic yesterday. States she was having some diarrhea yesterday, none today. States today she has been nauseous and having abdominal pain.  States she feels like her stomach is being twisted and it has been very uncomfortable. States her sinuses are much better also.     Relevant past medical, surgical, family and social history reviewed and updated as indicated. Interim medical history since our last visit reviewed. Allergies and medications reviewed and updated.  Review of Systems  Gastrointestinal:  Positive for nausea. Negative for diarrhea and vomiting.   Per HPI unless specifically indicated above     Objective:    There were no vitals taken for this visit.  Wt Readings from Last 3 Encounters:  06/03/21 163 lb 6.4 oz (74.1 kg)  03/20/21 165 lb (74.8 kg)  10/27/20 165 lb (74.8 kg)    Physical Exam Vitals and nursing note reviewed.  HENT:     Head: Normocephalic.     Right Ear: Hearing normal.     Left Ear: Hearing normal.     Nose: Nose normal.  Eyes:     Pupils: Pupils are equal, round, and reactive to light.  Pulmonary:     Effort: Pulmonary effort is normal. No respiratory distress.  Neurological:     Mental Status: She is alert.  Psychiatric:        Mood and Affect: Mood normal.        Behavior: Behavior normal.        Thought Content: Thought content normal.        Judgment: Judgment normal.    Results for orders placed or performed in visit on 06/03/21  Novel Coronavirus, NAA  ([facility])   Specimen: Nasopharyngeal(NP) swabs in vial transport medium  Result Value Ref Range   SARS-CoV-2, NAA Not Detected Not Detected  SARS-COV-2, NAA 2 DAY TAT  Result Value Ref Range   SARS-CoV-2, NAA 2 DAY TAT Performed       Assessment & Plan:   Problem List Items Addressed This Visit   None Visit Diagnoses     Nausea    -  Primary   Will send in Zofran to see if symptoms improved. Discussed side effects of the antibiotic with patient during visit. RTC if symptoms do not improve.        Follow up plan: No follow-ups on file.  This visit was completed via MyChart due to the restrictions of the COVID-19 pandemic. All issues as above were discussed and addressed. Physical exam was done as above through visual confirmation on MyChart. If it was felt that the patient should be evaluated in the office, they were directed there. The patient verbally consented to this visit. Location of the patient: Home Location of the provider: Office Those involved with this call:  Provider: Jon Billings, NP CMA: Yvonna Alanis, [place] Desk/Registration: Myrlene Broker This encounter was conducted via video.  I spent 15 dedicated to the care of this patient on the date  of this encounter to include previsit review of 20, face to face time with the patient, and post visit ordering of testing.

## 2021-06-17 DIAGNOSIS — M25531 Pain in right wrist: Secondary | ICD-10-CM | POA: Diagnosis not present

## 2021-06-17 DIAGNOSIS — M9902 Segmental and somatic dysfunction of thoracic region: Secondary | ICD-10-CM | POA: Diagnosis not present

## 2021-06-17 DIAGNOSIS — R519 Headache, unspecified: Secondary | ICD-10-CM | POA: Diagnosis not present

## 2021-06-17 DIAGNOSIS — M5414 Radiculopathy, thoracic region: Secondary | ICD-10-CM | POA: Diagnosis not present

## 2021-06-17 DIAGNOSIS — M9901 Segmental and somatic dysfunction of cervical region: Secondary | ICD-10-CM | POA: Diagnosis not present

## 2021-06-17 DIAGNOSIS — M25511 Pain in right shoulder: Secondary | ICD-10-CM | POA: Diagnosis not present

## 2021-06-27 ENCOUNTER — Other Ambulatory Visit: Payer: Self-pay | Admitting: Nurse Practitioner

## 2021-06-29 NOTE — Telephone Encounter (Signed)
Requested Prescriptions  Pending Prescriptions Disp Refills   montelukast (SINGULAIR) 10 MG tablet [Pharmacy Med Name: Montelukast Sodium 10 MG Oral Tablet] 90 tablet 0    Sig: TAKE 1 TABLET BY MOUTH AT BEDTIME     Pulmonology:  Leukotriene Inhibitors Passed - 06/27/2021  4:26 PM      Passed - Valid encounter within last 12 months    Recent Outpatient Visits          2 weeks ago Nausea   [place], NP   3 weeks ago Acute non-recurrent frontal sinusitis   [place], Barbaraann Faster, NP   3 months ago Routine general medical examination at a health care facility   [place], NP   9 months ago Fatigue, unspecified type   [facility], Lauren A, NP   10 months ago Fatigue, unspecified type   [place], Scheryl Darter, NP      Future Appointments            In 2 months Jon Billings, NP [place], [place]   In 4 months [place], Nicki Reaper, [place]

## 2021-07-08 DIAGNOSIS — M9901 Segmental and somatic dysfunction of cervical region: Secondary | ICD-10-CM | POA: Diagnosis not present

## 2021-07-08 DIAGNOSIS — M5414 Radiculopathy, thoracic region: Secondary | ICD-10-CM | POA: Diagnosis not present

## 2021-07-08 DIAGNOSIS — M25531 Pain in right wrist: Secondary | ICD-10-CM | POA: Diagnosis not present

## 2021-07-08 DIAGNOSIS — R519 Headache, unspecified: Secondary | ICD-10-CM | POA: Diagnosis not present

## 2021-07-08 DIAGNOSIS — M9902 Segmental and somatic dysfunction of thoracic region: Secondary | ICD-10-CM | POA: Diagnosis not present

## 2021-07-08 DIAGNOSIS — M25511 Pain in right shoulder: Secondary | ICD-10-CM | POA: Diagnosis not present

## 2021-07-29 DIAGNOSIS — M25511 Pain in right shoulder: Secondary | ICD-10-CM | POA: Diagnosis not present

## 2021-07-29 DIAGNOSIS — M9901 Segmental and somatic dysfunction of cervical region: Secondary | ICD-10-CM | POA: Diagnosis not present

## 2021-07-29 DIAGNOSIS — R519 Headache, unspecified: Secondary | ICD-10-CM | POA: Diagnosis not present

## 2021-07-29 DIAGNOSIS — M9902 Segmental and somatic dysfunction of thoracic region: Secondary | ICD-10-CM | POA: Diagnosis not present

## 2021-07-29 DIAGNOSIS — M25531 Pain in right wrist: Secondary | ICD-10-CM | POA: Diagnosis not present

## 2021-07-29 DIAGNOSIS — M5414 Radiculopathy, thoracic region: Secondary | ICD-10-CM | POA: Diagnosis not present

## 2021-08-10 ENCOUNTER — Other Ambulatory Visit: Payer: Self-pay | Admitting: Nurse Practitioner

## 2021-08-11 NOTE — Telephone Encounter (Signed)
Requested Prescriptions  ?Pending Prescriptions Disp Refills  ?? levothyroxine (SYNTHROID) 50 MCG tablet [Pharmacy Med Name: Levothyroxine Sodium 50 MCG Oral Tablet] 90 tablet 2  ?  Sig: Take 1 tablet by mouth once daily  ?  ? Endocrinology:  Hypothyroid Agents Passed - 08/10/2021 10:00 AM  ?  ?  Passed - TSH in normal range and within 360 days  ?  TSH  ?Date Value Ref Range Status  ?03/20/2021 3.150 0.450 - 4.500 uIU/mL Final  ?   ?  ?  Passed - Valid encounter within last 12 months  ?  Recent Outpatient Visits   ?      ? 2 months ago Nausea  ? Edith [place], NP  ? 2 months ago Acute non-recurrent frontal sinusitis  ? [place], Henrine Screws T, NP  ? 4 months ago Routine general medical examination at a health care facility  ? [facility], Lauren A, NP  ? 11 months ago Fatigue, unspecified type  ? [facility], Lauren A, NP  ? 11 months ago Fatigue, unspecified type  ? [facility], Scheryl Darter, NP  ?  ?  ?Future Appointments   ?        ? In 1 month Jon Billings, NP [facility]  ? In 3 months MacDiarm[place], Nicki Reaper, MD [place]  ?  ? ?  ?  ?  ?? lansoprazole (PREVACID) 30 MG capsule [Pharmacy Med Name: Lansoprazole 30 MG Oral Capsule Delayed Release] 90 capsule 2  ?  Sig: Take 1 capsule by mouth once daily  ?  ? Gastroenterology: Proton Pump Inhibitors 2 Passed - 08/10/2021 10:00 AM  ?  ?  Passed - ALT in normal range and within 360 days  ?  ALT  ?Date Value Ref Range Status  ?03/20/2021 24 0 - 32 IU/L Final  ?   ?  ?  Passed - AST in normal range and within 360 days  ?  AST  ?Date Value Ref Range Status  ?03/20/2021 28 0 - 40 IU/L Final  ?   ?  ?  Passed - Valid encounter within last 12 months  ?  Recent Outpatient Visits   ?      ? 2 months ago Nausea  ? [place], NP  ? 2 months ago Acute non-recurrent frontal sinusitis  ? [place], Henrine Screws T, NP  ? 4 months ago Routine general medical examination at a health care facility  ? [facility], Lauren A, NP  ? 11 months ago Fatigue, unspecified type  ? [facility], Lauren A, NP  ? 11 months ago Fatigue, unspecified type  ? [facility], Scheryl Darter, NP  ?  ?  ?Future Appointments   ?        ? In 1 month Jon Billings, NP [facility]  ? In 3 months MacDiarmid, Nicki Reaper, MD [place]  ?  ? ?  ?  ?  ?? levothyroxine (SYNTHROID) 75 MCG tablet [Pharmacy Med Name: Levothyroxine Sodium 75 MCG Oral Tablet] 90 tablet 2  ?  Sig: TAKE 1 TABLET BY MOUTH ONCE DAILY BEFORE BREAKFAST  ?  ? Endocrinology:  Hypothyroid Agents Passed - 08/10/2021 10:00 AM  ?  ?  Passed - TSH in normal range and within 360 days  ?  TSH  ?Date Value Ref Range Status  ?  03/20/2021 3.150 0.450 - 4.500 uIU/mL Final  ?   ?  ?  Passed - Valid encounter within last 12 months  ?  Recent Outpatient Visits   ?      ? 2 months ago Nausea  ? [place], NP  ? 2 months ago Acute non-recurrent frontal sinusitis  ? [place], Henrine Screws T, NP  ? 4 months ago Routine general medical examination at a health care facility  ? [facility], Lauren A, NP  ? 11 months ago Fatigue, unspecified type  ? [facility], Lauren A, NP  ? 11 months ago Fatigue, unspecified type  ? [facility], Scheryl Darter, NP  ?  ?  ?Future Appointments   ?        ? In 1 month Jon Billings, NP [facility]  ? In 3 months MacDiarmid, Nicki Reaper, MD [place]  ?  ? ?  ?  ?  ? ?

## 2021-08-19 DIAGNOSIS — M9902 Segmental and somatic dysfunction of thoracic region: Secondary | ICD-10-CM | POA: Diagnosis not present

## 2021-08-19 DIAGNOSIS — R519 Headache, unspecified: Secondary | ICD-10-CM | POA: Diagnosis not present

## 2021-08-19 DIAGNOSIS — M5414 Radiculopathy, thoracic region: Secondary | ICD-10-CM | POA: Diagnosis not present

## 2021-08-19 DIAGNOSIS — M25511 Pain in right shoulder: Secondary | ICD-10-CM | POA: Diagnosis not present

## 2021-08-19 DIAGNOSIS — M9901 Segmental and somatic dysfunction of cervical region: Secondary | ICD-10-CM | POA: Diagnosis not present

## 2021-08-19 DIAGNOSIS — M25531 Pain in right wrist: Secondary | ICD-10-CM | POA: Diagnosis not present

## 2021-09-08 DIAGNOSIS — M9902 Segmental and somatic dysfunction of thoracic region: Secondary | ICD-10-CM | POA: Diagnosis not present

## 2021-09-08 DIAGNOSIS — M25511 Pain in right shoulder: Secondary | ICD-10-CM | POA: Diagnosis not present

## 2021-09-08 DIAGNOSIS — M5414 Radiculopathy, thoracic region: Secondary | ICD-10-CM | POA: Diagnosis not present

## 2021-09-08 DIAGNOSIS — M9901 Segmental and somatic dysfunction of cervical region: Secondary | ICD-10-CM | POA: Diagnosis not present

## 2021-09-08 DIAGNOSIS — M25531 Pain in right wrist: Secondary | ICD-10-CM | POA: Diagnosis not present

## 2021-09-08 DIAGNOSIS — R519 Headache, unspecified: Secondary | ICD-10-CM | POA: Diagnosis not present

## 2021-09-16 NOTE — Progress Notes (Signed)
BP 116/69   Pulse 82   Temp 97.9 F (36.6 C) (Oral)   Wt 163 lb 9.6 oz (74.2 kg)   SpO2 100%   BMI 30.17 kg/m    Subjective:    Patient ID: Martha Henry, female    DOB: 07/03/1965, 56 y.o.   MRN: [id number]  HPI: Ambriana Henry is a 56 y.o. female  Chief Complaint  Patient presents with   Hypothyroidism    6 month follow up patient reports feeling fatigue in the afternoon at times    HYPOTHYROIDISM Thyroid control status:controlled Satisfied with current treatment? yes Medication side effects: no Medication compliance: excellent compliance Etiology of hypothyroidism:  Recent dose adjustment:no Fatigue:  has some fatigue in the afternoons at times Cold intolerance: no Heat intolerance: no Weight gain: no Weight loss: no Constipation: no Diarrhea/loose stools: no Palpitations: no Lower extremity edema: no Anxiety/depressed mood: no   Relevant past medical, surgical, family and social history reviewed and updated as indicated. Interim medical history since our last visit reviewed. Allergies and medications reviewed and updated.  Review of Systems  Constitutional:  Positive for fatigue. Negative for unexpected weight change.  Cardiovascular:  Negative for palpitations and leg swelling.  Gastrointestinal:  Negative for constipation and diarrhea.  Endocrine: Negative for cold intolerance and heat intolerance.  Psychiatric/Behavioral:  Negative for dysphoric mood. The patient is not nervous/anxious.    Per HPI unless specifically indicated above     Objective:    BP 116/69   Pulse 82   Temp 97.9 F (36.6 C) (Oral)   Wt 163 lb 9.6 oz (74.2 kg)   SpO2 100%   BMI 30.17 kg/m   Wt Readings from Last 3 Encounters:  09/17/21 163 lb 9.6 oz (74.2 kg)  06/03/21 163 lb 6.4 oz (74.1 kg)  03/20/21 165 lb (74.8 kg)    Physical Exam Vitals and nursing note reviewed.  Constitutional:      General: She is not in acute distress.    Appearance: Normal  appearance. She is normal weight. She is not ill-appearing, toxic-appearing or diaphoretic.  HENT:     Head: Normocephalic.     Right Ear: External ear normal.     Left Ear: External ear normal.     Nose: Nose normal.     Mouth/Throat:     Mouth: Mucous membranes are moist.     Pharynx: Oropharynx is clear.  Eyes:     General:        Right eye: No discharge.        Left eye: No discharge.     Extraocular Movements: Extraocular movements intact.     Conjunctiva/sclera: Conjunctivae normal.     Pupils: Pupils are equal, round, and reactive to light.  Cardiovascular:     Rate and Rhythm: Normal rate and regular rhythm.     Heart sounds: No murmur heard. Pulmonary:     Effort: Pulmonary effort is normal. No respiratory distress.     Breath sounds: Normal breath sounds. No wheezing or rales.  Musculoskeletal:     Cervical back: Normal range of motion and neck supple.  Skin:    General: Skin is warm and dry.     Capillary Refill: Capillary refill takes less than 2 seconds.  Neurological:     General: No focal deficit present.     Mental Status: She is alert and oriented to person, place, and time. Mental status is at baseline.  Psychiatric:  Mood and Affect: Mood normal.        Behavior: Behavior normal.        Thought Content: Thought content normal.        Judgment: Judgment normal.    Results for orders placed or performed in visit on 06/03/21  Novel Coronavirus, NAA ([facility])   Specimen: Nasopharyngeal(NP) swabs in vial transport medium  Result Value Ref Range   SARS-CoV-2, NAA Not Detected Not Detected  SARS-COV-2, NAA 2 DAY TAT  Result Value Ref Range   SARS-CoV-2, NAA 2 DAY TAT Performed       Assessment & Plan:   Problem List Items Addressed This Visit       Endocrine   Hypothyroidism - Primary    Chronic.  Controlled.  Continue with current medication regimen of Levothyroxine.  Labs ordered today.  Return to clinic in 6 months for reevaluation.  Call  sooner if concerns arise.         Relevant Orders   TSH   T4, free     Follow up plan: Return in about 6 months (around 03/20/2022) for Physical and Fasting labs.

## 2021-09-17 ENCOUNTER — Ambulatory Visit: Payer: BC Managed Care – PPO | Admitting: Nurse Practitioner

## 2021-09-17 ENCOUNTER — Encounter: Payer: Self-pay | Admitting: Nurse Practitioner

## 2021-09-17 VITALS — BP 116/69 | HR 82 | Temp 97.9°F | Wt 163.6 lb

## 2021-09-17 DIAGNOSIS — E039 Hypothyroidism, unspecified: Secondary | ICD-10-CM

## 2021-09-17 NOTE — Assessment & Plan Note (Signed)
Chronic.  Controlled.  Continue with current medication regimen of Levothyroxine.  Labs ordered today.  Return to clinic in 6 months for reevaluation.  Call sooner if concerns arise.   

## 2021-09-18 LAB — T4, FREE: Free T4: 1.27 ng/dL (ref 0.82–1.77)

## 2021-09-18 LAB — TSH: TSH: 2.34 u[IU]/mL (ref 0.450–4.500)

## 2021-09-18 MED ORDER — LEVOTHYROXINE SODIUM 75 MCG PO TABS: 75.0000 ug | ORAL_TABLET | Freq: Every day | ORAL | 1 refills | Status: DC

## 2021-09-18 MED ORDER — LEVOTHYROXINE SODIUM 50 MCG PO TABS: 50.0000 ug | ORAL_TABLET | Freq: Every day | ORAL | 1 refills | Status: DC

## 2021-09-18 NOTE — Progress Notes (Signed)
HI Martha Henry.  Your lab work looks good.  Continue with your current levothyroxine regimen.  I have sent your refills to the pharmacy.  Please let me know if you have any questions.

## 2021-09-18 NOTE — Addendum Note (Signed)
Addended by: Jon Billings on: 09/18/2021 07:49 AM   Modules accepted: Orders

## 2021-09-30 DIAGNOSIS — M25511 Pain in right shoulder: Secondary | ICD-10-CM | POA: Diagnosis not present

## 2021-09-30 DIAGNOSIS — R519 Headache, unspecified: Secondary | ICD-10-CM | POA: Diagnosis not present

## 2021-09-30 DIAGNOSIS — M9902 Segmental and somatic dysfunction of thoracic region: Secondary | ICD-10-CM | POA: Diagnosis not present

## 2021-09-30 DIAGNOSIS — M5414 Radiculopathy, thoracic region: Secondary | ICD-10-CM | POA: Diagnosis not present

## 2021-09-30 DIAGNOSIS — M9901 Segmental and somatic dysfunction of cervical region: Secondary | ICD-10-CM | POA: Diagnosis not present

## 2021-09-30 DIAGNOSIS — M25531 Pain in right wrist: Secondary | ICD-10-CM | POA: Diagnosis not present

## 2021-10-20 ENCOUNTER — Other Ambulatory Visit: Payer: Self-pay | Admitting: Nurse Practitioner

## 2021-10-20 NOTE — Telephone Encounter (Signed)
Requested Prescriptions  Pending Prescriptions Disp Refills  . montelukast (SINGULAIR) 10 MG tablet [Pharmacy Med Name: Montelukast Sodium 10 MG Oral Tablet] 90 tablet 0    Sig: TAKE 1 TABLET BY MOUTH AT BEDTIME     Pulmonology:  Leukotriene Inhibitors Passed - 10/20/2021  8:37 AM      Passed - Valid encounter within last 12 months    Recent Outpatient Visits          1 month ago Hypothyroidism, unspecified type   [facility] Jon Billings, NP   4 months ago Nausea   [place], NP   4 months ago Acute non-recurrent frontal sinusitis   [place], Barbaraann Faster, NP   7 months ago Routine general medical examination at a health care facility   [place], NP   1 year ago Fatigue, unspecified type   [facility] Charyl Dancer, NP      Future Appointments            In 2 weeks MacDiarmid, Nicki Reaper, MD [place]   In 5 months Jon Billings, NP [facility] [place]

## 2021-10-21 DIAGNOSIS — M9901 Segmental and somatic dysfunction of cervical region: Secondary | ICD-10-CM | POA: Diagnosis not present

## 2021-10-21 DIAGNOSIS — M9902 Segmental and somatic dysfunction of thoracic region: Secondary | ICD-10-CM | POA: Diagnosis not present

## 2021-10-21 DIAGNOSIS — M5414 Radiculopathy, thoracic region: Secondary | ICD-10-CM | POA: Diagnosis not present

## 2021-10-21 DIAGNOSIS — M25531 Pain in right wrist: Secondary | ICD-10-CM | POA: Diagnosis not present

## 2021-10-21 DIAGNOSIS — M25511 Pain in right shoulder: Secondary | ICD-10-CM | POA: Diagnosis not present

## 2021-10-21 DIAGNOSIS — R519 Headache, unspecified: Secondary | ICD-10-CM | POA: Diagnosis not present

## 2021-11-09 ENCOUNTER — Ambulatory Visit: Payer: BC Managed Care – PPO | Admitting: Urology

## 2021-11-09 ENCOUNTER — Encounter: Payer: Self-pay | Admitting: Urology

## 2021-11-09 VITALS — BP 126/82 | HR 74 | Ht 62.0 in | Wt 164.0 lb

## 2021-11-09 DIAGNOSIS — N3946 Mixed incontinence: Secondary | ICD-10-CM

## 2021-11-09 MED ORDER — MIRABEGRON ER 50 MG PO TB24: 50.0000 mg | ORAL_TABLET | Freq: Every day | ORAL | 3 refills | Status: DC

## 2021-11-09 NOTE — Progress Notes (Signed)
11/09/2021 9:33 AM   Martha Henry 02-01-1966 [id number]  Referring provider: Jon Billings, NP [address] [place],  [place] [address]  No chief complaint on file.   HPI: was consulted to assist the patient is urinary incontinence.  She has had this for many years and saw another urologist Dr. Jacqlyn Larsen.  He has her on imipramine that may or may not be helping.   She can leak with coughing sneezing not bending lifting.  She has no urge incontinence.  She has no bedwetting.  When she finished the shower she can has some urgency and dribble a small amount of urine.  She wears 2 or 3 liners per day that are damp.   She voids every 1 hour and cannot hold it for 2 hours.  Gets up once or twice at night   She was having some pressure in the lower abdomen but this settled down and she describes her recent negative urinalysis   Mild grade 2 hypermobility the bladder neck and no stress incontinence.  Tissue is a bit elastic.  Asymptomatic distal grade 1 rectocele   Patient saw Dr. Jacqlyn Larsen for years with mild stress incontinence which she currently has.  She does have I believe perhaps some urgency associated leakage in the shower.  She does have a frequent bladder voiding every 1 hour with mild nocturia.  The role of trying some other medication change urodynamics discussed.     I was not surprised that the patient's not sure if she wants to proceed with urodynamics but at the same time does not want to live with the problem.  She took my advice and I will see her back in 6 weeks on Myrbetriq samples and prescription.  We can always order the test pending results and goals.   Mild stress incontinence a bit better Much less frequency and not leaking "" all the time small amounts she is very pleased.      Urge incontinence much better.  Still leaks with cough or sneeze but intermittent.  Were not can pursue other therapies.  90x3 sent to pharmacy.  See in a year.  Clinically not infected    Today v Frequency stable.  Takes a little bit more time to void.  No infections.  No leaking in shower.  Incontinence much better.    Today Urge incontinence much better.  Frequency improved.  Still has stress incontinence when she chokes or coughs hard.  We talked about surgery and physical therapy.  No infections   PMH: Past Medical History:  Diagnosis Date   COVID-19 07/2019   GERD (gastroesophageal reflux disease)    Headache    sinus   History of abnormal mammogram    seen by Dr. Jamal Collin   Hypothyroidism    ITP (idiopathic thrombocytopenic purpura) 1990   no issues in several years   Low back pain    Tubular adenoma of colon 2016   Tubular adenoma of colon    Urinary incontinence    Wears contact lenses     Surgical History: Past Surgical History:  Procedure Laterality Date   APPENDECTOMY  2009   BREAST CYST ASPIRATION Left 2019   CHOLECYSTECTOMY  2009   COLONOSCOPY WITH PROPOFOL N/A 02/26/2015   Procedure: COLONOSCOPY WITH PROPOFOL;  Surgeon: Christene Lye, MD;  Location: [facility] ENDOSCOPY;  Service: Endoscopy;  Laterality: N/A;   COLONOSCOPY WITH PROPOFOL N/A 06/13/2020   Procedure: COLONOSCOPY WITH PROPOFOL;  Surgeon: Robert Bellow, MD;  Location: [facility] ENDOSCOPY;  Service: Endoscopy;  Laterality: N/A;   NASAL TURBINATE REDUCTION Bilateral 05/02/2015   Procedure: TURBINATE REDUCTION/SUBMUCOSAL RESECTION;  Surgeon: Beverly Gust, MD;  Location: [place];  Service: ENT;  Laterality: Bilateral;   SEPTOPLASTY N/A 05/02/2015   Procedure: SEPTOPLASTY;  Surgeon: Beverly Gust, MD;  Location: [place];  Service: ENT;  Laterality: N/A;   [place]  2003   partial/only ovaries remain    Home Medications:  Allergies as of 11/09/2021       Reactions   Erythromycin Other (See Comments)   Chest pain   Aspirin Other (See Comments)   Platelet problems   Ciprofloxacin Other (See Comments)    constipation        Medication List        Accurate as of November 09, 2021  9:33 AM. If you have any questions, ask your nurse or doctor.          Calcium-Vitamin D-Vitamin K [id number] MG-UNT-MCG Chew Chew by mouth.   cetirizine 10 MG tablet Commonly known as: ZYRTEC Take 10 mg by mouth daily.   fluticasone 50 MCG/ACT nasal spray Commonly known as: FLONASE USE 2 SPRAY(S) IN EACH NOSTRIL TWICE DAILY   lansoprazole 30 MG capsule Commonly known as: PREVACID Take 1 capsule by mouth once daily   levothyroxine 50 MCG tablet Commonly known as: SYNTHROID Take 1 tablet (50 mcg total) by mouth daily.   levothyroxine 75 MCG tablet Commonly known as: SYNTHROID Take 1 tablet (75 mcg total) by mouth daily before breakfast.   mirabegron ER 50 MG Tb24 tablet Commonly known as: MYRBETRIQ Take 1 tablet (50 mg total) by mouth daily.   montelukast 10 MG tablet Commonly known as: SINGULAIR TAKE 1 TABLET BY MOUTH AT BEDTIME   multivitamin with minerals tablet Take by mouth.   Turmeric Curcumin 500 MG Caps Take by mouth daily.   VITAMIN B-12 PO Take by mouth daily.        Allergies:  Allergies  Allergen Reactions   Erythromycin Other (See Comments)    Chest pain   Aspirin Other (See Comments)    Platelet problems   Ciprofloxacin Other (See Comments)    constipation    Family History: Family History  Problem Relation Age of Onset   Cancer Mother 16       breast   Breast cancer Mother 34   Colon cancer Father 34   Cancer Father        colon   Breast cancer Maternal Aunt    Cancer Maternal Aunt        breast   Ovarian cancer Maternal Aunt        Stage 4   Lung cancer Maternal Grandfather    Cervical cancer Maternal Grandmother     Social History:  reports that she has never smoked. She has never used smokeless tobacco. She reports that she does not drink alcohol and does not use drugs.  ROS:                                         Physical Exam: There were no vitals taken for this visit.  Constitutional:  Alert and oriented, No acute distress. HEENT: [place] AT, moist mucus membranes.  Trachea midline, no masses.  Laboratory Data: Lab Results  Component Value Date   WBC 9.1 03/20/2021  HGB 13.1 03/20/2021   HCT 40.9 03/20/2021   MCV 81 03/20/2021   PLT 331 03/20/2021    Lab Results  Component Value Date   CREATININE 0.87 03/20/2021    No results found for: "PSA"  No results found for: "TESTOSTERONE"  No results found for: "HGBA1C"  Urinalysis    Component Value Date/Time   APPEARANCEUR Clear 03/18/2020 0934   GLUCOSEU Negative 03/18/2020 0934   BILIRUBINUR Negative 03/18/2020 0934   PROTEINUR Negative 03/18/2020 0934   NITRITE Negative 03/18/2020 0934   LEUKOCYTESUR Negative 03/18/2020 0934    Pertinent Imaging:   Assessment & Plan: Sent Myrbetriq 50 mg 90x3 to pharmacy.  Consult physical therapy.  I think there is an excellent choice.  Reassess in 1 year  There are no diagnoses linked to this encounter.  No follow-ups on file.  Reece Packer, MD  [place] [facility] [address] [place], [place] [address] [phone]

## 2021-11-11 DIAGNOSIS — M25531 Pain in right wrist: Secondary | ICD-10-CM | POA: Diagnosis not present

## 2021-11-11 DIAGNOSIS — R519 Headache, unspecified: Secondary | ICD-10-CM | POA: Diagnosis not present

## 2021-11-11 DIAGNOSIS — M9901 Segmental and somatic dysfunction of cervical region: Secondary | ICD-10-CM | POA: Diagnosis not present

## 2021-11-11 DIAGNOSIS — M5414 Radiculopathy, thoracic region: Secondary | ICD-10-CM | POA: Diagnosis not present

## 2021-11-11 DIAGNOSIS — M25511 Pain in right shoulder: Secondary | ICD-10-CM | POA: Diagnosis not present

## 2021-11-11 DIAGNOSIS — M9902 Segmental and somatic dysfunction of thoracic region: Secondary | ICD-10-CM | POA: Diagnosis not present

## 2021-11-17 ENCOUNTER — Ambulatory Visit: Payer: BC Managed Care – PPO

## 2021-12-04 DIAGNOSIS — M9901 Segmental and somatic dysfunction of cervical region: Secondary | ICD-10-CM | POA: Diagnosis not present

## 2021-12-04 DIAGNOSIS — M25531 Pain in right wrist: Secondary | ICD-10-CM | POA: Diagnosis not present

## 2021-12-04 DIAGNOSIS — M25511 Pain in right shoulder: Secondary | ICD-10-CM | POA: Diagnosis not present

## 2021-12-04 DIAGNOSIS — M9902 Segmental and somatic dysfunction of thoracic region: Secondary | ICD-10-CM | POA: Diagnosis not present

## 2021-12-04 DIAGNOSIS — R519 Headache, unspecified: Secondary | ICD-10-CM | POA: Diagnosis not present

## 2021-12-04 DIAGNOSIS — M5414 Radiculopathy, thoracic region: Secondary | ICD-10-CM | POA: Diagnosis not present

## 2021-12-16 DIAGNOSIS — L600 Ingrowing nail: Secondary | ICD-10-CM | POA: Diagnosis not present

## 2021-12-16 DIAGNOSIS — B351 Tinea unguium: Secondary | ICD-10-CM | POA: Diagnosis not present

## 2021-12-22 DIAGNOSIS — M9901 Segmental and somatic dysfunction of cervical region: Secondary | ICD-10-CM | POA: Diagnosis not present

## 2021-12-22 DIAGNOSIS — M25511 Pain in right shoulder: Secondary | ICD-10-CM | POA: Diagnosis not present

## 2021-12-22 DIAGNOSIS — R519 Headache, unspecified: Secondary | ICD-10-CM | POA: Diagnosis not present

## 2021-12-22 DIAGNOSIS — M5414 Radiculopathy, thoracic region: Secondary | ICD-10-CM | POA: Diagnosis not present

## 2021-12-22 DIAGNOSIS — M9902 Segmental and somatic dysfunction of thoracic region: Secondary | ICD-10-CM | POA: Diagnosis not present

## 2021-12-22 DIAGNOSIS — M25531 Pain in right wrist: Secondary | ICD-10-CM | POA: Diagnosis not present

## 2022-01-11 ENCOUNTER — Other Ambulatory Visit: Payer: Self-pay | Admitting: Nurse Practitioner

## 2022-01-13 ENCOUNTER — Other Ambulatory Visit: Payer: Self-pay | Admitting: Nurse Practitioner

## 2022-01-13 DIAGNOSIS — M5414 Radiculopathy, thoracic region: Secondary | ICD-10-CM | POA: Diagnosis not present

## 2022-01-13 DIAGNOSIS — Z1231 Encounter for screening mammogram for malignant neoplasm of breast: Secondary | ICD-10-CM

## 2022-01-13 DIAGNOSIS — R519 Headache, unspecified: Secondary | ICD-10-CM | POA: Diagnosis not present

## 2022-01-13 DIAGNOSIS — M25511 Pain in right shoulder: Secondary | ICD-10-CM | POA: Diagnosis not present

## 2022-01-13 DIAGNOSIS — M9901 Segmental and somatic dysfunction of cervical region: Secondary | ICD-10-CM | POA: Diagnosis not present

## 2022-01-13 DIAGNOSIS — M25531 Pain in right wrist: Secondary | ICD-10-CM | POA: Diagnosis not present

## 2022-01-13 DIAGNOSIS — M9902 Segmental and somatic dysfunction of thoracic region: Secondary | ICD-10-CM | POA: Diagnosis not present

## 2022-01-13 NOTE — Telephone Encounter (Signed)
Requested Prescriptions  Pending Prescriptions Disp Refills  . montelukast (SINGULAIR) 10 MG tablet [Pharmacy Med Name: Montelukast Sodium 10 MG Oral Tablet] 90 tablet 0    Sig: TAKE 1 TABLET BY MOUTH AT BEDTIME     Pulmonology:  Leukotriene Inhibitors Passed - 01/11/2022  9:44 PM      Passed - Valid encounter within last 12 months    Recent Outpatient Visits          3 months ago Hypothyroidism, unspecified type   [facility] Jon Billings, NP   7 months ago Nausea   [place], Santiago Glad, NP   7 months ago Acute non-recurrent frontal sinusitis   [place], Barbaraann Faster, NP   9 months ago Routine general medical examination at a health care facility   [place], NP   1 year ago Fatigue, unspecified type   [facility] Charyl Dancer, NP      Future Appointments            In 2 months Jon Billings, NP [place], [place]   In 32 months [place], Nicki Reaper, [place]

## 2022-01-16 ENCOUNTER — Ambulatory Visit
Admission: RE | Admit: 2022-01-16 | Discharge: 2022-01-16 | Disposition: A | Discharge status: Home / Self Care | Payer: BC Managed Care – PPO | Source: Ambulatory Visit | Attending: Urgent Care | Admitting: Urgent Care

## 2022-01-16 VITALS — BP 133/86 | HR 78 | Temp 97.9°F | Resp 17

## 2022-01-16 DIAGNOSIS — M546 Pain in thoracic spine: Secondary | ICD-10-CM

## 2022-01-16 MED ORDER — PREDNISONE 50 MG PO TABS: 50.0000 mg | ORAL_TABLET | Freq: Every day | ORAL | 0 refills | Status: AC

## 2022-01-16 NOTE — ED Provider Notes (Signed)
[place]   [id number] 01/16/22 Arrival Time: 0272  ASSESSMENT & PLAN:  1. Acute midline thoracic back pain    Neck: patient declines to attempt range of motion due to concern for "sharp" pain that she might experience should she move.  Torso: patient exhibits limited ROM (lateral twisting) d/t pain.  Shoulders: exhibits limited ROM bilaterally, R > L, anteriorly and laterally.  There is no tenderness, no bony tenderness, no swelling, no crepitus, no deformity, no pain and normal strength. There is no suspicion of acute fracture or osseous abnormality.  Suspect chronic MSK disorder with exacerbation d/t recent physical exertion during exercise. Offered a course of prednisone as anti-inflammatory however had discussion about need for detailed MSK evaluation, possibly with advanced imaging, by orthopedics and possible chronic treatment.  Recommended patient follow-up with primary care or with Ortho Urgent care.  Imaging: No results found.    Meds ordered this encounter  Medications   predniSONE (DELTASONE) 50 MG tablet    Sig: Take 1 tablet (50 mg total) by mouth daily with breakfast for 5 days.    Dispense:  5 tablet    Refill:  0    Order Specific Question:   Supervising Provider    Answer:   Chase Picket [[id number]]    [place] Controlled Substances Registry consulted for this patient. I feel the risk/benefit ratio today is favorable for proceeding with this prescription for a controlled substance. Medication sedation precautions given.  Reviewed expectations re: course of current medical issues. Questions answered. Outlined signs and symptoms indicating need for more acute intervention. Patient verbalized understanding. After Visit Summary given.  SUBJECTIVE: History from: patient and family. Maryori Eimi Viney is a 56 y.o. female who reports    Issues wth shoulder blades and neck Pressure on my chest Simethicone Tylenol Progressvely worse Goes to  chiropractor Feel frozen. Cant move my head. Feel like a knife, burning, stinging Tried ice and heat Vacation 3 weeks ago and walking 6 miles a day. Not much problem then  Symptoms x 2 weeks. But "on/off  Have had "knife in my back" when to cardiology and cleared.     Shoulder Pain: Patient complaints of persistent pain of her mid-thoracic back, cervical neck, bilateral shoulders. This is evaluated as a personal injury. The pain is described as aching, burning, sharp, shooting, stabbing, and throbbing.  The onset of the pain was  chronic and worsening in last 2 weeks .  The pain occurs continuously.  Location is posterior, scapular, neck. Symptoms are aggravated by all activities. Symptoms are diminished by  rest.   Limited activities include: all activities. no crepitus noted is reported.   ROS: As per HPI.   OBJECTIVE:  Vitals:   01/16/22 0932  BP: 133/86  Pulse: 78  Resp: 17  Temp: 97.9 F (36.6 C)  SpO2: 98%    General appearance: alert; no distress Extremities: no cyanosis or edema; symmetrical with no gross deformities; no tenderness over her thoracic back with no swelling and unkonwn bruising; ROM: ROM: limited by pain CV: normal extremity capillary refill Skin: warm and dry Neurologic: normal gait; normal symmetric reflexes in all extremities; normal sensation in all extremities Psychological: alert and cooperative; normal mood and affect    Imaging: MM 3D SCREEN BREAST BILATERAL  Result Date: 12/10/2020 CLINICAL DATA:  Screening. EXAM: DIGITAL SCREENING BILATERAL MAMMOGRAM WITH TOMOSYNTHESIS AND CAD TECHNIQUE: Bilateral screening digital craniocaudal and mediolateral oblique mammograms were obtained. Bilateral screening digital breast tomosynthesis was performed. The images  were evaluated with computer-aided detection. COMPARISON:  Previous exam(s). ACR Breast Density Category b: There are scattered areas of fibroglandular density. FINDINGS: There are no findings  suspicious for malignancy. IMPRESSION: No mammographic evidence of malignancy. A result letter of this screening mammogram will be mailed directly to the patient. RECOMMENDATION: Screening mammogram in one year. (Code:SM-B-01Y) BI-RADS CATEGORY  1: Negative. Electronically Signed   By: Ammie Ferrier M.D.   On: 12/10/2020 15:10   Allergies  Allergen Reactions   Erythromycin Other (See Comments)    Chest pain   Aspirin Other (See Comments)    Platelet problems   Ciprofloxacin Other (See Comments)    constipation    Past Medical History:  Diagnosis Date   COVID-19 07/2019   GERD (gastroesophageal reflux disease)    Headache    sinus   History of abnormal mammogram    seen by Dr. Jamal Collin   Hypothyroidism    ITP (idiopathic thrombocytopenic purpura) 1990   no issues in several years   Low back pain    Tubular adenoma of colon 2016   Tubular adenoma of colon    Urinary incontinence    Wears contact lenses    Social History   Socioeconomic History   Marital status: Married    Spouse name: Not on file   Number of children: Not on file   Years of education: Not on file   Highest education level: Not on file  Occupational History   Not on file  Tobacco Use   Smoking status: Never   Smokeless tobacco: Never  Vaping Use   Vaping Use: Never used  Substance and Sexual Activity   Alcohol use: No   Drug use: No   Sexual activity: Yes  Other Topics Concern   Not on file  Social History Narrative   Not on file   Social Determinants of Health   Financial Resource Strain: Not on file  Food Insecurity: Not on file  Transportation Needs: Not on file  Physical Activity: Not on file  Stress: Not on file  Social Connections: Not on file  Intimate Partner Violence: Not on file   Family History  Problem Relation Age of Onset   Cancer Mother 23       breast   Breast cancer Mother 30   Colon cancer Father 55   Cancer Father        colon   Breast cancer Maternal Aunt     Cancer Maternal Aunt        breast   Ovarian cancer Maternal Aunt        Stage 4   Lung cancer Maternal Grandfather    Cervical cancer Maternal Grandmother    Past Surgical History:  Procedure Laterality Date   APPENDECTOMY  2009   BREAST CYST ASPIRATION Left 2019   CHOLECYSTECTOMY  2009   COLONOSCOPY WITH PROPOFOL N/A 02/26/2015   Procedure: COLONOSCOPY WITH PROPOFOL;  Surgeon: Christene Lye, MD;  Location: [facility];  Service: Endoscopy;  Laterality: N/A;   COLONOSCOPY WITH PROPOFOL N/A 06/13/2020   Procedure: COLONOSCOPY WITH PROPOFOL;  Surgeon: Robert Bellow, MD;  Location: [facility];  Service: Endoscopy;  Laterality: N/A;   NASAL TURBINATE REDUCTION Bilateral 05/02/2015   Procedure: TURBINATE REDUCTION/SUBMUCOSAL RESECTION;  Surgeon: Beverly Gust, MD;  Location: [place];  Service: ENT;  Laterality: Bilateral;   SEPTOPLASTY N/A 05/02/2015   Procedure: SEPTOPLASTY;  Surgeon: Beverly Gust, MD;  Location: [place];  Service: ENT;  Laterality: N/A;  TONSILLECTOMY  1975   TOTAL ABDOMINAL HYSTERECTOMY  2003   partial/only ovaries remain      Rose Phi,  01/16/22 1014

## 2022-01-16 NOTE — Discharge Instructions (Addendum)
Follow-up with your primary care provider or with urgent care walk-in clinic.

## 2022-01-16 NOTE — ED Triage Notes (Signed)
Pt. States that she has had recurrent shoulder pain for over two weeks. Pt. Endorses pain that starts in the neck and radiates to both shoulder blades. Pt. Describes an overall stiff feeling. Pt. Also endorses chest pressure d/t the pain.

## 2022-01-18 DIAGNOSIS — R0789 Other chest pain: Secondary | ICD-10-CM | POA: Diagnosis not present

## 2022-01-18 DIAGNOSIS — M542 Cervicalgia: Secondary | ICD-10-CM | POA: Diagnosis not present

## 2022-01-18 DIAGNOSIS — S29019A Strain of muscle and tendon of unspecified wall of thorax, initial encounter: Secondary | ICD-10-CM | POA: Diagnosis not present

## 2022-01-18 DIAGNOSIS — M47812 Spondylosis without myelopathy or radiculopathy, cervical region: Secondary | ICD-10-CM | POA: Diagnosis not present

## 2022-01-20 NOTE — Progress Notes (Unsigned)
Cardiology Office Note    Date:  01/21/2022   ID:  Martha, Henry 28-Nov-1965, MRN [id number]  PCP:  Jon Billings, NP  Cardiologist:  Nelva Bush, MD  Electrophysiologist:  None   Chief Complaint: Chest pressure, dyspnea, thoracic back pain, right sided neck and right upper extremity pain  History of Present Illness:   Martha Henry is a 56 y.o. female with history of ITP, hypothyroidism, and GERD who presents for evaluation of chest pressure, dyspnea, thoracic back pain, right-sided neck and right upper extremity pain that has been present for 2 weeks.  She was initially, and last seen, in 04/2020 for evaluation of a several year history of intermittent chest discomfort described as gas, though at times felt like a "boot is pressing on her chest where there is a knife stabbing her in the shoulder."  Symptoms worsened following COVID illness.  Echo in 05/2020 showed an EF of 55 to 60%, no regional wall motion abnormalities, normal LV diastolic function parameters, normal RV systolic function and ventricular cavity size, no significant valvular abnormalities, and an estimated right atrial pressure of 3 mmHg.  ETT in 05/2020 demonstrated fair exercise capacity with normal heart rate and blood pressure responses, and was overall low risk.  She can present today accompanied by her husband.  She reports a 2-week history of thoracic midline back discomfort that initially began while sitting in an uncomfortable airplane seat while traveling towards the [employer] [place] [employer] for vacation.  In this context she also has developed right sided neck discomfort and right upper extremity discomfort that is worse with range of motion and did briefly improve with prednisone taper.  She has also noted shortness of breath and substernal chest pressure that feels like "a boot pushing on her chest."  Symptoms are exertional.  She has tried taking antacids without significant improvement.  No significant  lower extremity swelling, warmth, or erythema.  No associated diaphoresis, nausea, vomiting, or palpitations.  No dizziness, presyncope, or syncope.  She has been evaluated at a local urgent care for her midline back discomfort and was prescribed prednisone as outlined above with brief improvement.  She was subsequently evaluated by orthopedics with recommendation to follow-up with cardiology.  Today, she notes some improvement in her right upper extremity discomfort with range of motion.  Symptoms have largely been constant.   Labs independently reviewed: 12/2021 - AST/ALT normal 08/2021 - TSH normal 03/2021 - TC 186, TG 109, HDL 65, LDL 102, Hgb 10.1, PLT 331, BUN 9, serum creatinine 0.87, potassium 4.0 BUN 4.6  Past Medical History:  Diagnosis Date   COVID-19 07/2019   GERD (gastroesophageal reflux disease)    Headache    sinus   History of abnormal mammogram    seen by Dr. Jamal [place]   Hypothyroidism    ITP (idiopathic thrombocytopenic purpura) 1990   no issues in several years   Low back pain    Tubular adenoma of colon 2016   Tubular adenoma of colon    Urinary incontinence    Wears contact lenses     Past Surgical History:  Procedure Laterality Date   APPENDECTOMY  2009   BREAST CYST ASPIRATION Left 2019   CHOLECYSTECTOMY  2009   COLONOSCOPY WITH PROPOFOL N/A 02/26/2015   Procedure: COLONOSCOPY WITH PROPOFOL;  Surgeon: Martha Lye, MD;  Location: [facility];  Service: Endoscopy;  Laterality: N/A;   COLONOSCOPY WITH PROPOFOL N/A 06/13/2020   Procedure: COLONOSCOPY WITH PROPOFOL;  Surgeon: Martha Bellow,  MD;  Location: [facility];  Service: Endoscopy;  Laterality: N/A;   NASAL TURBINATE REDUCTION Bilateral 05/02/2015   Procedure: TURBINATE REDUCTION/SUBMUCOSAL RESECTION;  Surgeon: Martha Gust, MD;  Location: [place];  Service: ENT;  Laterality: Bilateral;   SEPTOPLASTY N/A 05/02/2015   Procedure: SEPTOPLASTY;  Surgeon: Martha Gust, MD;   Location: [place];  Service: ENT;  Laterality: N/A;   [place]  2003   partial/only ovaries remain    Current Medications: Current Meds  Medication Sig   Calcium-Vitamin D-Vitamin K [id number] MG-UNT-MCG CHEW Chew by mouth.   cetirizine (ZYRTEC) 10 MG tablet Take 10 mg by mouth daily.   Cyanocobalamin (VITAMIN B-12 PO) Take by mouth daily.   fluticasone (FLONASE) 50 MCG/ACT nasal spray USE 2 SPRAY(S) IN EACH NOSTRIL TWICE DAILY   lansoprazole (PREVACID) 30 MG capsule Take 1 capsule by mouth once daily   levothyroxine (SYNTHROID) 50 MCG tablet Take 1 tablet (50 mcg total) by mouth daily.   levothyroxine (SYNTHROID) 75 MCG tablet Take 1 tablet (75 mcg total) by mouth daily before breakfast.   metoprolol tartrate (LOPRESSOR) 100 MG tablet Take 1 tablet (100 mg total) by mouth once for 1 dose. Take 2 hours before your test.   mirabegron ER (MYRBETRIQ) 50 MG TB24 tablet Take 1 tablet (50 mg total) by mouth daily.   montelukast (SINGULAIR) 10 MG tablet TAKE 1 TABLET BY MOUTH AT BEDTIME   Multiple Vitamins-Minerals (MULTIVITAMIN WITH MINERALS) tablet Take by mouth.   Turmeric Curcumin 500 MG CAPS Take by mouth daily.     Allergies:   Erythromycin, Aspirin, and Ciprofloxacin   Social History   Socioeconomic History   Marital status: Married    Spouse name: Not on file   Number of children: Not on file   Years of education: Not on file   Highest education level: Not on file  Occupational History   Not on file  Tobacco Use   Smoking status: Never   Smokeless tobacco: Never  Vaping Use   Vaping Use: Never used  Substance and Sexual Activity   Alcohol use: No   Drug use: No   Sexual activity: Yes  Other Topics Concern   Not on file  Social History Narrative   Not on file   Social Determinants of Health   Financial Resource Strain: Not on file  Food Insecurity: Not on file  Transportation Needs: Not on file  Physical  Activity: Not on file  Stress: Not on file  Social Connections: Not on file     Family History:  The patient's family history includes Breast cancer in her maternal aunt; Breast cancer (age of onset: 60) in her mother; Cancer in her father and maternal aunt; Cancer (age of onset: 38) in her mother; Cervical cancer in her maternal grandmother; Colon cancer (age of onset: 23) in her father; Lung cancer in her maternal grandfather; Ovarian cancer in her maternal aunt.  ROS:   Review of Systems  Constitutional:  Positive for malaise/fatigue. Negative for chills, diaphoresis, fever and weight loss.  HENT:  Negative for congestion.   Eyes:  Negative for discharge and redness.  Respiratory:  Positive for shortness of breath. Negative for cough, sputum production and wheezing.   Cardiovascular:  Positive for chest pain. Negative for palpitations, orthopnea, claudication, leg swelling and PND.  Gastrointestinal:  Negative for abdominal pain, heartburn, nausea and vomiting.  Musculoskeletal:  Positive for back pain, joint pain and neck pain. Negative  for falls and myalgias.  Skin:  Negative for rash.  Neurological:  Negative for dizziness, tingling, tremors, sensory change, speech change, focal weakness, loss of consciousness and weakness.  Endo/Heme/Allergies:  Does not bruise/bleed easily.  Psychiatric/Behavioral:  Negative for substance abuse. The patient is not nervous/anxious.   All other systems reviewed and are negative.    EKGs/Labs/Other Studies Reviewed:    Studies reviewed were summarized above. The additional studies were reviewed today:  2D echo 05/07/2020: 1. Left ventricular ejection fraction, by estimation, is 55 to 60%. The  left ventricle has normal function. The left ventricle has no regional  wall motion abnormalities. Left ventricular diastolic parameters were  normal. The average left ventricular  global longitudinal strain is -15.7 %. The global longitudinal strain is   abnormal.   2. Right ventricular systolic function is normal. The right ventricular  size is normal.   3. The mitral valve is normal in structure. No evidence of mitral valve  regurgitation. No evidence of mitral stenosis.   4. The aortic valve is normal in structure. Aortic valve regurgitation is  not visualized. No aortic stenosis is present.   5. The inferior vena cava is normal in size with greater than 50%  respiratory variability, suggesting right atrial pressure of 3 mmHg. __________  ETT 05/16/2020: Baseline EKG demonstrates normal sinus rhythm with poor R wave progression. The patient demonstrates fair exercise capacity. She exhibited normal heart rate and blood pressure responses. No angina was reported. There were no significant ST segment or T wave changes, though baseline artifact limits evaluation. No significant arrhythmia wwas observed. Low risk exercise tolerance test (Duke Treadmill Score = +6).   Low risk exercise tolerance test without ischemic changes at workload achieved.   EKG:  EKG is ordered today.  The EKG ordered today demonstrates NSR, 85 bpm, low voltage QRS, prior septal infarct versus lead placement, no acute ST-T changes  Recent Labs: 03/20/2021: ALT 24 09/17/2021: TSH 2.340 01/21/2022: BUN 15; Creatinine, Ser 0.88; Hemoglobin 12.2; Platelets 394; Potassium 3.5; Sodium 139  Recent Lipid Panel    Component Value Date/Time   CHOL 186 03/20/2021 1100   TRIG 109 03/20/2021 1100   HDL 65 03/20/2021 1100   LDLCALC 102 (H) 03/20/2021 1100    PHYSICAL EXAM:    VS:  BP 118/76   Pulse 85   Ht '5\' 2"'$  (1.575 m)   Wt 166 lb (75.3 kg)   SpO2 99%   BMI 30.36 kg/m   BMI: Body mass index is 30.36 kg/m.  Physical Exam Vitals reviewed.  Constitutional:      Appearance: She is well-developed.  HENT:     Head: Normocephalic and atraumatic.  Eyes:     General:        Right eye: No discharge.        Left eye: No discharge.  Neck:     Vascular: No JVD.   Cardiovascular:     Rate and Rhythm: Normal rate and regular rhythm.     Pulses:          Posterior tibial pulses are 2+ on the right side and 2+ on the left side.     Heart sounds: Normal heart sounds, S1 normal and S2 normal. Heart sounds not distant. No midsystolic click and no opening snap. No murmur heard.    No friction rub.  Pulmonary:     Effort: Pulmonary effort is normal. No respiratory distress.     Breath sounds: Normal breath sounds. No decreased  breath sounds, wheezing or rales.  Chest:     Chest wall: No tenderness.  Abdominal:     General: There is no distension.  Musculoskeletal:     Cervical back: Normal range of motion.     Right lower leg: No edema.     Left lower leg: No edema.     Comments: No cervical spine, thoracic spine, or paraspinal muscle tenderness to palpation.  Skin:    General: Skin is warm and dry.     Nails: There is no clubbing.  Neurological:     Mental Status: She is alert and oriented to person, place, and time.  Psychiatric:        Speech: Speech normal.        Behavior: Behavior normal.        Thought Content: Thought content normal.        Judgment: Judgment normal.     Wt Readings from Last 3 Encounters:  01/21/22 166 lb (75.3 kg)  11/09/21 164 lb (74.4 kg)  09/17/21 163 lb 9.6 oz (74.2 kg)     ASSESSMENT & PLAN:   Chest pain with moderate risk for cardiac etiology/precordial pain/dyspnea: Hemodynamically stable in the office today with oxygen saturation of 99% on room air and heart rate of 85 bpm.  Check stat troponin and D-dimer, given recent plane travel.  If abnormal will refer to the ED for expedited work-up to include lower extremity ultrasound and CTA of the chest.  If labs are unrevealing, would proceed with coronary CTA which will allow [place] to evaluate for high risk ischemia as well as for aortic pathology.  Schedule echo.  Further cardiac recommendations pending the above work-up.  Check CBC and BMP.  Thoracic midline back  pain/right shoulder and upper extremity pain: Appears to be consistent with musculoskeletal etiology.  Symptoms began after sitting in an uncomfortable airplane seat and are exacerbated with range of motion of the right upper extremity.  However, she does also report exertional dyspnea, chest pressure, and [place].  We will evaluate the coronary arteries and aorta as outlined above.  If cardiac work-up is unrevealing, recommend ongoing management per PCP and orthopedics.    Disposition: F/u with Dr. Saunders Revel or an APP in 1 month.   Medication Adjustments/Labs and Tests Ordered: Current medicines are reviewed at length with the patient today.  Concerns regarding medicines are outlined above. Medication changes, Labs and Tests ordered today are summarized above and listed in the Patient Instructions accessible in Encounters.   Signed, Christell Faith, PA-C 01/21/2022 12:16 PM     [place] [address] [place], [place] [address] [phone]

## 2022-01-21 ENCOUNTER — Ambulatory Visit: Payer: BC Managed Care – PPO | Attending: Physician Assistant | Admitting: Physician Assistant

## 2022-01-21 ENCOUNTER — Encounter: Payer: Self-pay | Admitting: Physician Assistant

## 2022-01-21 ENCOUNTER — Other Ambulatory Visit
Admission: RE | Admit: 2022-01-21 | Discharge: 2022-01-21 | Disposition: A | Discharge status: Home / Self Care | Payer: BC Managed Care – PPO | Attending: Physician Assistant | Admitting: Physician Assistant

## 2022-01-21 VITALS — BP 118/76 | HR 85 | Ht 62.0 in | Wt 166.0 lb

## 2022-01-21 DIAGNOSIS — R079 Chest pain, unspecified: Secondary | ICD-10-CM | POA: Diagnosis not present

## 2022-01-21 DIAGNOSIS — R072 Precordial pain: Secondary | ICD-10-CM

## 2022-01-21 DIAGNOSIS — M25511 Pain in right shoulder: Secondary | ICD-10-CM

## 2022-01-21 DIAGNOSIS — R0602 Shortness of breath: Secondary | ICD-10-CM

## 2022-01-21 DIAGNOSIS — M546 Pain in thoracic spine: Secondary | ICD-10-CM

## 2022-01-21 DIAGNOSIS — M79601 Pain in right arm: Secondary | ICD-10-CM

## 2022-01-21 DIAGNOSIS — G8929 Other chronic pain: Secondary | ICD-10-CM

## 2022-01-21 LAB — CBC
HCT: 38.2 % (ref 36.0–46.0)
Hemoglobin: 12.2 g/dL (ref 12.0–15.0)
MCH: 26 pg (ref 26.0–34.0)
MCHC: 31.9 g/dL (ref 30.0–36.0)
MCV: 81.4 fL (ref 80.0–100.0)
Platelets: 394 10*3/uL (ref 150–400)
RBC: 4.69 MIL/uL (ref 3.87–5.11)
RDW: 14 % (ref 11.5–15.5)
WBC: 15.2 10*3/uL — ABNORMAL HIGH (ref 4.0–10.5)
nRBC: 0 % (ref 0.0–0.2)

## 2022-01-21 LAB — BASIC METABOLIC PANEL
Anion gap: 6 (ref 5–15)
BUN: 15 mg/dL (ref 6–20)
CO2: 28 mmol/L (ref 22–32)
Calcium: 9.1 mg/dL (ref 8.9–10.3)
Chloride: 105 mmol/L (ref 98–111)
Creatinine, Ser: 0.88 mg/dL (ref 0.44–1.00)
GFR, Estimated: >60 mL/min (ref >60)
Glucose, Bld: 85 mg/dL (ref 70–99)
Potassium: 3.5 mmol/L (ref 3.5–5.1)
Sodium: 139 mmol/L (ref 135–145)

## 2022-01-21 LAB — D-DIMER, QUANTITATIVE: D-Dimer, Quant: 0.31 ug/mL-FEU (ref 0.00–0.50)

## 2022-01-21 LAB — TROPONIN I (HIGH SENSITIVITY): Troponin I (High Sensitivity): 2 ng/L (ref <18)

## 2022-01-21 MED ORDER — METOPROLOL TARTRATE 100 MG PO TABS: 100.0000 mg | ORAL_TABLET | Freq: Once | ORAL | 0 refills | Status: DC

## 2022-01-21 NOTE — Patient Instructions (Addendum)
Medication Instructions:  Your physician recommends that you continue on your current medications as directed. Please refer to the Current Medication list given to you today.  *If you need a refill on your cardiac medications before your next appointment, please call your pharmacy*   Lab Work: BMET, CBC, Troponin, and D Dimer over at the [facility] entrance and check in at registration. Please stay there in the lobby until we call you with those results.    If you have labs (blood work) drawn today and your tests are completely normal, you will receive your results only by: [place] (if you have MyChart) OR A paper copy in the mail If you have any lab test that is abnormal or we need to change your treatment, we will call you to review the results.   Testing/Procedures: Your physician has requested that you have an echocardiogram. Echocardiography is a painless test that uses sound waves to create images of your heart. It provides your doctor with information about the size and shape of your heart and how well your heart's chambers and valves are working. This procedure takes approximately one hour. There are no restrictions for this procedure.    Your cardiac CT is scheduled at the below location:    [facility] [place], [place] [address] [phone]  Appointment scheduled for 01/28/22 at 2:45 pm. Please arrive at 2:30 pm for check-in and test prep.    Please follow these instructions carefully (unless otherwise directed):  Hold all erectile dysfunction medications at least 3 days (72 hrs) prior to test. (Ie viagra, cialis, sildenafil, tadalafil, etc) We will administer nitroglycerin during this exam.   On the Night Before the Test: Be sure to Drink plenty of water. Do not consume any caffeinated/decaffeinated beverages or chocolate 12 hours prior to your test. Do not take any antihistamines 12 hours  prior to your test.  On the Day of the Test: Drink plenty of water until 1 hour prior to the test. You may take your regular medications prior to the test.  Take metoprolol (Lopressor) 100 mg two hours prior to test. HOLD Furosemide/Hydrochlorothiazide morning of the test. FEMALES- please wear underwire-free bra if available, avoid dresses & tight clothing        After the Test: Drink plenty of water. After receiving IV contrast, you may experience a mild flushed feeling. This is normal. On occasion, you may experience a mild rash up to 24 hours after the test. This is not dangerous. If this occurs, you can take Benadryl 25 mg and increase your fluid intake. If you experience trouble breathing, this can be serious. If it is severe call 911 IMMEDIATELY. If it is mild, please call our office. If you take any of these medications: Glipizide/Metformin, Avandament, Glucavance, please do not take 48 hours after completing test unless otherwise instructed.  For non-scheduling related questions, please contact the cardiac imaging nurse navigator should you have any questions/concerns: Marchia Bond, Cardiac Imaging Nurse Navigator Gordy Clement, Cardiac Imaging Nurse Navigator [facility] Direct Office Dial: [phone]   For scheduling needs, including cancellations and rescheduling, please call [place], [phone].    Follow-Up: At [facility], you and your health needs are our priority.  As part of our continuing mission to provide you with exceptional heart care, we have created designated Provider Care Teams.  These Care Teams include your primary Cardiologist (physician) and Advanced Practice Providers (APPs -  Physician Assistants and Nurse Practitioners) who all work together to provide you with the care you need, when you need it.   Your next appointment:   1 month(s)  The format for your next appointment:   In Person  Provider:    Nelva Bush, MD or Christell Faith, PA-C         Important Information About [place]

## 2022-01-26 ENCOUNTER — Ambulatory Visit
Admission: RE | Admit: 2022-01-26 | Discharge: 2022-01-26 | Disposition: A | Discharge status: Home / Self Care | Payer: BC Managed Care – PPO | Attending: Physician Assistant | Admitting: Physician Assistant

## 2022-01-26 ENCOUNTER — Ambulatory Visit: Payer: BC Managed Care – PPO | Admitting: Physician Assistant

## 2022-01-26 ENCOUNTER — Ambulatory Visit: Payer: Self-pay | Admitting: *Deleted

## 2022-01-26 ENCOUNTER — Encounter: Payer: Self-pay | Admitting: Physician Assistant

## 2022-01-26 ENCOUNTER — Telehealth (INDEPENDENT_AMBULATORY_CARE_PROVIDER_SITE_OTHER): Payer: BC Managed Care – PPO | Admitting: Physician Assistant

## 2022-01-26 ENCOUNTER — Ambulatory Visit
Admission: RE | Admit: 2022-01-26 | Discharge: 2022-01-26 | Disposition: A | Discharge status: Home / Self Care | Payer: BC Managed Care – PPO | Source: Ambulatory Visit | Attending: Physician Assistant | Admitting: Physician Assistant

## 2022-01-26 DIAGNOSIS — R0602 Shortness of breath: Secondary | ICD-10-CM

## 2022-01-26 DIAGNOSIS — J9811 Atelectasis: Secondary | ICD-10-CM | POA: Diagnosis not present

## 2022-01-26 NOTE — Telephone Encounter (Signed)
Opened chart to answer question for agent.  Pt calling in requesting a chest x ray to r/o walking pneumonia due to c/o chest pressure.   She's recently been evaluated in Urgent care and her cardiologist for this chest pressure so let agent know it was ok to schedule appt. For chest x ray.

## 2022-01-26 NOTE — Progress Notes (Signed)
Virtual Visit via Video Note  I connected with Martha Henry on 01/26/22 at  9:00 AM EDT by a video enabled telemedicine application and verified that I am speaking with the correct person using two identifiers.  Today's Provider: Talitha Givens, MHS, PA-C Introduced myself to the patient as a PA-C and provided education on APPs in clinical practice.    Location: Patient: at home, [facility], [place]  Provider: at home, [place], [place]   I discussed the limitations of evaluation and management by telemedicine and the availability of in person appointments. The patient expressed understanding and agreed to proceed.   Chief Complaint  Patient presents with   Xray    Patient wants to get a chest xray to see if she has walking pneumonia. Chest pressure, heavy breathing, winded when she moves, heaviness in arm, especially in left arm, has been going on for the past 2 to 3 weeks. Patient has seen Cardiology yesterday and a CT of the heart is scheduled for Thursday.     History of Present Illness:   States she went on a hiking trip a few weeks ago- no accidents or falls, no trauma  Reports she is having SOB, chest and back pain, - these seemed to have resolved but have come back Reports she is having pain in shoulders now   Reports she is still having chest pressure and stabbing pain in shoulders Reports her arms feel like a "dead weight" no numbness or tingling Reports feeling winded and sweaty when she is active  No cough, fever,  She has taken a round of Prednisone from UC - reports this seemed to improve the chest tightness for the first day but pain returned after day two.  She denies tick bites to her knowledge   Review of Systems  Constitutional:  Negative for fever and malaise/fatigue.  HENT:  Negative for congestion, ear pain and sinus pain.   Respiratory:  Positive for shortness of breath. Negative for cough.   Gastrointestinal:  Positive for nausea. Negative for  constipation, diarrhea and vomiting.  Musculoskeletal:  Positive for myalgias.  Skin:  Negative for itching and rash.  Neurological:  Negative for dizziness and headaches.     Observations/Objective:  Due to the nature of the virtual visit, physical exam and observations are limited. Able to obtain the following observations:   Alert, oriented, Appears comfortable, in no acute distress.  No scleral injection, no appreciated hoarseness, tachypnea, wheeze or strider. Able to maintain conversation without visible strain.  No cough appreciated during visit.    Assessment and Plan:   Problem List Items Addressed This Visit       Other   SOB (shortness of breath) on exertion - Primary    Acute, new concern Reports SOB on exertion for the past few weeks after a hiking trip out of state She denies symptoms consistent with infectious etiology - fever, myalgias, coughing, congestion She is interested in obtaining a CXR to rule out pneumonia- will order this today CXR was overall normal with exception of minimal atelectasis- will recommend she engage in deep breathing exercises to help improve this. She has seen cardiology for this as well and has heart CT upcoming in the next few weeks- recommend she keep that apt Follow up as needed for persistent or progressing symptoms       Relevant Orders   DG Chest 2 View (Completed)   Follow Up Instructions:    I discussed the assessment  and treatment plan with the patient. The patient was provided an opportunity to ask questions and all were answered. The patient agreed with the plan and demonstrated an understanding of the instructions.   The patient was advised to call back or seek an in-person evaluation if the symptoms worsen or if the condition fails to improve as anticipated.  I provided 11 minutes of non-face-to-face time during this encounter.   No follow-ups on file.   I, Nephi Savage E Maimuna Leaman, PA-C, have reviewed all documentation  for this visit. The documentation on 01/26/22 for the exam, diagnosis, procedures, and orders are all accurate and complete.   Talitha Givens, MHS, PA-C [facility]

## 2022-01-26 NOTE — Assessment & Plan Note (Signed)
Acute, new concern Reports SOB on exertion for the past few weeks after a hiking trip out of state She denies symptoms consistent with infectious etiology - fever, myalgias, coughing, congestion She is interested in obtaining a CXR to rule out pneumonia- will order this today CXR was overall normal with exception of minimal atelectasis- will recommend she engage in deep breathing exercises to help improve this. She has seen cardiology for this as well and has heart CT upcoming in the next few weeks- recommend she keep that apt Follow up as needed for persistent or progressing symptoms

## 2022-01-27 ENCOUNTER — Telehealth (HOSPITAL_COMMUNITY): Payer: Self-pay | Admitting: *Deleted

## 2022-01-27 NOTE — Telephone Encounter (Signed)
Reaching out to patient to offer assistance regarding upcoming cardiac imaging study; pt verbalizes understanding of appt date/time, parking situation and where to check in,  medications ordered, and verified current allergies; name and call back number provided for further questions should they arise  Viera Okonski RN Navigator Cardiac Imaging [facility] [phone] office [phone] cell  Patient to take 100mg metoprolol tartrate two hours prior to her cardiac CT scan.  

## 2022-01-28 ENCOUNTER — Ambulatory Visit
Admission: RE | Admit: 2022-01-28 | Discharge: 2022-01-28 | Disposition: A | Discharge status: Home / Self Care | Payer: BC Managed Care – PPO | Source: Ambulatory Visit | Attending: Physician Assistant | Admitting: Physician Assistant

## 2022-01-28 DIAGNOSIS — R072 Precordial pain: Secondary | ICD-10-CM | POA: Insufficient documentation

## 2022-01-28 MED ORDER — DILTIAZEM HCL 25 MG/5ML IV SOLN
5.0000 mg | Freq: Once | INTRAVENOUS | Status: AC
  Administered 2022-01-28: 5 mg via INTRAVENOUS

## 2022-01-28 MED ORDER — DILTIAZEM HCL 25 MG/5ML IV SOLN
10.0000 mg | Freq: Once | INTRAVENOUS | Status: AC
  Administered 2022-01-28: 10 mg via INTRAVENOUS

## 2022-01-28 MED ORDER — METOPROLOL TARTRATE 5 MG/5ML IV SOLN
10.0000 mg | Freq: Once | INTRAVENOUS | Status: AC
  Administered 2022-01-28: 10 mg via INTRAVENOUS

## 2022-01-28 MED ORDER — NITROGLYCERIN 0.4 MG SL SUBL
0.8000 mg | SUBLINGUAL_TABLET | Freq: Once | SUBLINGUAL | Status: AC
  Administered 2022-01-28: 0.8 mg via SUBLINGUAL

## 2022-01-28 MED ORDER — IOHEXOL 350 MG/ML SOLN
75.0000 mL | Freq: Once | INTRAVENOUS | Status: AC | PRN
  Administered 2022-01-28: 75 mL via INTRAVENOUS

## 2022-01-28 NOTE — Progress Notes (Signed)
Patient tolerated CT well. Drank water after. Vital signs stable encourage to drink water throughout day.Reasons explained and verbalized understanding. Ambulated steady gait.  

## 2022-02-03 DIAGNOSIS — R519 Headache, unspecified: Secondary | ICD-10-CM | POA: Diagnosis not present

## 2022-02-03 DIAGNOSIS — M5414 Radiculopathy, thoracic region: Secondary | ICD-10-CM | POA: Diagnosis not present

## 2022-02-03 DIAGNOSIS — M25511 Pain in right shoulder: Secondary | ICD-10-CM | POA: Diagnosis not present

## 2022-02-03 DIAGNOSIS — M9902 Segmental and somatic dysfunction of thoracic region: Secondary | ICD-10-CM | POA: Diagnosis not present

## 2022-02-03 DIAGNOSIS — B351 Tinea unguium: Secondary | ICD-10-CM | POA: Diagnosis not present

## 2022-02-03 DIAGNOSIS — M9901 Segmental and somatic dysfunction of cervical region: Secondary | ICD-10-CM | POA: Diagnosis not present

## 2022-02-03 DIAGNOSIS — M25531 Pain in right wrist: Secondary | ICD-10-CM | POA: Diagnosis not present

## 2022-02-09 ENCOUNTER — Ambulatory Visit
Admission: RE | Admit: 2022-02-09 | Discharge: 2022-02-09 | Disposition: A | Discharge status: Home / Self Care | Payer: BC Managed Care – PPO | Source: Ambulatory Visit | Attending: Nurse Practitioner | Admitting: Nurse Practitioner

## 2022-02-09 DIAGNOSIS — Z1231 Encounter for screening mammogram for malignant neoplasm of breast: Secondary | ICD-10-CM | POA: Insufficient documentation

## 2022-02-10 NOTE — Progress Notes (Signed)
Please let patient know her Mammogram did not show any evidence of a malignancy.  The recommendation is to repeat the Mammogram in 1 year.  

## 2022-02-18 ENCOUNTER — Ambulatory Visit: Payer: BC Managed Care – PPO | Attending: Physician Assistant

## 2022-02-18 DIAGNOSIS — R079 Chest pain, unspecified: Secondary | ICD-10-CM | POA: Diagnosis not present

## 2022-02-18 DIAGNOSIS — R072 Precordial pain: Secondary | ICD-10-CM

## 2022-02-18 DIAGNOSIS — R0602 Shortness of breath: Secondary | ICD-10-CM

## 2022-02-18 LAB — ECHOCARDIOGRAM COMPLETE
AR max vel: 2.35 cm2
AV Area VTI: 2.26 cm2
AV Area mean vel: 2.37 cm2
AV Mean grad: 3 mmHg
AV Peak grad: 6 mmHg
Ao pk vel: 1.22 m/s
Area-P 1/2: 4.77 cm2
Calc EF: 61.8 %
S' Lateral: 3.1 cm
Single Plane A2C EF: 56.6 %
Single Plane A4C EF: 64.4 %

## 2022-03-01 DIAGNOSIS — M5414 Radiculopathy, thoracic region: Secondary | ICD-10-CM | POA: Diagnosis not present

## 2022-03-01 DIAGNOSIS — M25531 Pain in right wrist: Secondary | ICD-10-CM | POA: Diagnosis not present

## 2022-03-01 DIAGNOSIS — M9902 Segmental and somatic dysfunction of thoracic region: Secondary | ICD-10-CM | POA: Diagnosis not present

## 2022-03-01 DIAGNOSIS — M25511 Pain in right shoulder: Secondary | ICD-10-CM | POA: Diagnosis not present

## 2022-03-01 DIAGNOSIS — M9901 Segmental and somatic dysfunction of cervical region: Secondary | ICD-10-CM | POA: Diagnosis not present

## 2022-03-01 DIAGNOSIS — R519 Headache, unspecified: Secondary | ICD-10-CM | POA: Diagnosis not present

## 2022-03-04 NOTE — Progress Notes (Signed)
Cardiology Office Note    Date:  03/05/2022   ID:  Cincere, Deprey 1965/11/13, MRN [id number]  PCP:  Jon Billings, NP  Cardiologist:  Nelva Bush, MD  Electrophysiologist:  None   Chief Complaint: Follow-up  History of Present Illness:   Martha Henry is a 56 y.o. female with history of ITP, hypothyroidism, and GERD who presents for follow-up of coronary CTA and echo.   She was initially seen in 04/2020 for evaluation of a several year history of intermittent chest discomfort described as gas, though at times felt like a "boot is pressing on her chest where there is a knife stabbing her in the shoulder."  Symptoms worsened following COVID illness.  Echo in 05/2020 showed an EF of 55 to 60%, no regional wall motion abnormalities, normal LV diastolic function parameters, normal RV systolic function and ventricular cavity size, no significant valvular abnormalities, and an estimated right atrial pressure of 3 mmHg.  ETT in 05/2020 demonstrated fair exercise capacity with normal heart rate and blood pressure responses, and was overall low risk.  She was last seen in the office in 01/2022 reporting a 2-week history of thoracic midline back discomfort that initially began while sitting in an uncomfortable airplane seat while traveling towards the [employer] [place] [employer] for vacation.  In this context she also has developed right sided neck discomfort and right upper extremity discomfort that is worse with range of motion and did briefly improve with prednisone taper.  She has also noted shortness of breath and substernal chest pressure that felt like "a boot pushing on her chest."  She had been evaluated at a local urgent care for her midline back discomfort and was prescribed prednisone as outlined above with brief improvement.  She was subsequently evaluated by orthopedics with recommendation to follow-up with cardiology.  At the time of her cardiology appointment, she noted an improvement  in her right upper extremity discomfort with range of motion.  Stat D-dimer and high-sensitivity troponin were negative.  Chest x-ray showed minimal subsegmental atelectasis in the right lower lung field, otherwise was nonacute.  Coronary CTA on 01/28/2022 showed a calcium score of 0 with no evidence of CAD.  Noncardiac over read was notable for a small hiatal hernia.  Echo on 02/18/2022 showed an EF of 55 to 60%, no regional wall motion abnormalities, grade 1 diastolic dysfunction, normal RV systolic function and ventricular cavity size, no significant valvular abnormalities, and an estimated right atrial pressure of 3 mmHg.  She comes in doing reasonably well from a cardiac perspective.  She does continue to have intermittent randomly occurring bilateral/mid shoulder back discomfort as well as occasional substernal chest pressure.  However, symptoms have improved when compared to last visit.  She also continues to have intermittent randomly occurring episodes of shortness of breath.  Weight remains stable.  No significant lower extremity swelling.  Lastly, she reports issues with dysphagia.   Labs independently reviewed: 01/2022 - AST/ALT normal 01/2022 - potassium 3.5, BUN 15, serum creatinine 0.88, Hgb 12.2, PLT 394 08/2021 - TSH normal 03/2021 - TC 186, TG 109, HDL 65, LDL 102  Past Medical History:  Diagnosis Date   COVID-19 07/2019   GERD (gastroesophageal reflux disease)    Headache    sinus   History of abnormal mammogram    seen by Dr. Jamal Collin   Hypothyroidism    ITP (idiopathic thrombocytopenic purpura) 1990   no issues in several years   Low back pain  Tubular adenoma of colon 2016   Tubular adenoma of colon    Urinary incontinence    Wears contact lenses     Past Surgical History:  Procedure Laterality Date   APPENDECTOMY  2009   BREAST CYST ASPIRATION Left 2019   CHOLECYSTECTOMY  2009   COLONOSCOPY WITH PROPOFOL N/A 02/26/2015   Procedure: COLONOSCOPY WITH PROPOFOL;   Surgeon: Christene Lye, MD;  Location: [facility] ENDOSCOPY;  Service: Endoscopy;  Laterality: N/A;   COLONOSCOPY WITH PROPOFOL N/A 06/13/2020   Procedure: COLONOSCOPY WITH PROPOFOL;  Surgeon: Robert Bellow, MD;  Location: [facility] ENDOSCOPY;  Service: Endoscopy;  Laterality: N/A;   NASAL TURBINATE REDUCTION Bilateral 05/02/2015   Procedure: TURBINATE REDUCTION/SUBMUCOSAL RESECTION;  Surgeon: Beverly Gust, MD;  Location: [facility];  Service: ENT;  Laterality: Bilateral;   SEPTOPLASTY N/A 05/02/2015   Procedure: SEPTOPLASTY;  Surgeon: Beverly Gust, MD;  Location: [facility];  Service: ENT;  Laterality: N/A;   [place]  2003   partial/only ovaries remain    Current Medications: Current Meds  Medication Sig   Calcium-Vitamin D-Vitamin K [id number] MG-UNT-MCG CHEW Chew by mouth.   cetirizine (ZYRTEC) 10 MG tablet Take 10 mg by mouth daily.   Cyanocobalamin (VITAMIN B-12 PO) Take by mouth daily.   fluticasone (FLONASE) 50 MCG/ACT nasal spray USE 2 SPRAY(S) IN EACH NOSTRIL TWICE DAILY   lansoprazole (PREVACID) 30 MG capsule Take 1 capsule by mouth once daily   levothyroxine (SYNTHROID) 50 MCG tablet Take 1 tablet (50 mcg total) by mouth daily.   levothyroxine (SYNTHROID) 75 MCG tablet Take 1 tablet (75 mcg total) by mouth daily before breakfast.   mirabegron ER (MYRBETRIQ) 50 MG TB24 tablet Take 1 tablet (50 mg total) by mouth daily.   montelukast (SINGULAIR) 10 MG tablet TAKE 1 TABLET BY MOUTH AT BEDTIME   Multiple Vitamins-Minerals (MULTIVITAMIN WITH MINERALS) tablet Take by mouth.   Turmeric Curcumin 500 MG CAPS Take by mouth daily.     Allergies:   Erythromycin, Aspirin, and Ciprofloxacin   Social History   Socioeconomic History   Marital status: Married    Spouse name: Not on file   Number of children: Not on file   Years of education: Not on file   Highest education level: Not on file  Occupational History    Not on file  Tobacco Use   Smoking status: Never   Smokeless tobacco: Never  Vaping Use   Vaping Use: Never used  Substance and Sexual Activity   Alcohol use: No   Drug use: No   Sexual activity: Yes  Other Topics Concern   Not on file  Social History Narrative   Not on file   Social Determinants of Health   Financial Resource Strain: Not on file  Food Insecurity: Not on file  Transportation Needs: Not on file  Physical Activity: Not on file  Stress: Not on file  Social Connections: Not on file     Family History:  The patient's family history includes Breast cancer in her maternal aunt; Breast cancer (age of onset: 12) in her mother; Cancer in her father and maternal aunt; Cancer (age of onset: 52) in her mother; Cervical cancer in her maternal grandmother; Colon cancer (age of onset: 36) in her father; Lung cancer in her maternal grandfather; Ovarian cancer in her maternal aunt.  ROS:   12-point review of systems is negative unless otherwise noted in the HPI.   EKGs/Labs/Other Studies Reviewed:  Studies reviewed were summarized above. The additional studies were reviewed today:  2D echo 05/07/2020: 1. Left ventricular ejection fraction, by estimation, is 55 to 60%. The  left ventricle has normal function. The left ventricle has no regional  wall motion abnormalities. Left ventricular diastolic parameters were  normal. The average left ventricular  global longitudinal strain is -15.7 %. The global longitudinal strain is  abnormal.   2. Right ventricular systolic function is normal. The right ventricular  size is normal.   3. The mitral valve is normal in structure. No evidence of mitral valve  regurgitation. No evidence of mitral stenosis.   4. The aortic valve is normal in structure. Aortic valve regurgitation is  not visualized. No aortic stenosis is present.   5. The inferior vena cava is normal in size with greater than 50%  respiratory variability, suggesting  right atrial pressure of 3 mmHg. __________   ETT 05/16/2020: Baseline EKG demonstrates normal sinus rhythm with poor R wave progression. The patient demonstrates fair exercise capacity. She exhibited normal heart rate and blood pressure responses. No angina was reported. There were no significant ST segment or T wave changes, though baseline artifact limits evaluation. No significant arrhythmia wwas observed. Low risk exercise tolerance test (Duke Treadmill Score = +6).   Low risk exercise tolerance test without ischemic changes at workload achieved. __________  Coronary CTA 01/28/2022: FINDINGS: Aorta:  Normal size.  No calcifications.  No dissection.   Aortic Valve:  Trileaflet.  No calcifications.   Coronary Arteries:  Normal coronary origin.  Right dominance.   RCA is a dominant artery that gives rise to PDA and PLA. There is no plaque.   Left main gives rise to LAD and LCX arteries.  LM has no disease.   LAD has no plaque.   LCX is a non-dominant artery that gives rise to an OM1 branch. There is no plaque.   Other findings:   Normal pulmonary vein drainage into the left atrium.   Normal left atrial appendage without a thrombus.   Normal size of the pulmonary artery.   IMPRESSION: 1. Coronary calcium score of 0. Patient is low risk for coronary events. 2. Normal coronary origin with right dominance. 3. No evidence of CAD. 4. CAD-RADS 0. Consider non-atherosclerotic causes of chest pain. __________  2D echo 02/18/2022: 1. Left ventricular ejection fraction, by estimation, is 55 to 60%. The  left ventricle has normal function. The left ventricle has no regional  wall motion abnormalities. Left ventricular diastolic parameters are  consistent with Grade I diastolic  dysfunction (impaired relaxation). The average left ventricular global  longitudinal strain is -17.6 %.   2. Right ventricular systolic function is normal. The right ventricular  size is normal.    3. The mitral valve is normal in structure. No evidence of mitral valve  regurgitation. No evidence of mitral stenosis.   4. The aortic valve has an indeterminant number of cusps. Aortic valve  regurgitation is not visualized. No aortic stenosis is present.   5. The inferior vena cava is normal in size with greater than 50%  respiratory variability, suggesting right atrial pressure of 3 mmHg.    EKG:  EKG is not ordered today given reassuring cardiac work-up.  Recent Labs: 03/20/2021: ALT 24 09/17/2021: TSH 2.340 01/21/2022: BUN 15; Creatinine, Ser 0.88; Hemoglobin 12.2; Platelets 394; Potassium 3.5; Sodium 139  Recent Lipid Panel    Component Value Date/Time   CHOL 186 03/20/2021 1100   TRIG 109 03/20/2021 1100  HDL 65 03/20/2021 1100   LDLCALC 102 (H) 03/20/2021 1100    PHYSICAL EXAM:    VS:  BP 108/78 (BP Location: Left Arm, Patient Position: Sitting, Cuff Size: Normal)   Pulse 84   Ht '5\' 2"'$  (1.575 m)   Wt 165 lb 2 oz (74.9 kg)   SpO2 98%   BMI 30.20 kg/m   BMI: Body mass index is 30.2 kg/m.  Physical Exam Vitals reviewed.  Constitutional:      Appearance: She is well-developed.  HENT:     Head: Normocephalic and atraumatic.  Eyes:     General:        Right eye: No discharge.        Left eye: No discharge.  Neck:     Vascular: No JVD.  Cardiovascular:     Rate and Rhythm: Normal rate and regular rhythm.     Pulses:          Posterior tibial pulses are 2+ on the right side and 2+ on the left side.     Heart sounds: Normal heart sounds, S1 normal and S2 normal. Heart sounds not distant. No midsystolic click and no opening snap. No murmur heard.    No friction rub.  Pulmonary:     Effort: Pulmonary effort is normal. No respiratory distress.     Breath sounds: Normal breath sounds. No decreased breath sounds, wheezing or rales.  Chest:     Chest wall: No tenderness.  Abdominal:     General: There is no distension.  Musculoskeletal:     Cervical back: Normal  range of motion.     Right lower leg: No edema.     Left lower leg: No edema.  Skin:    General: Skin is warm and dry.     Nails: There is no clubbing.  Neurological:     Mental Status: She is alert and oriented to person, place, and time.  Psychiatric:        Speech: Speech normal.        Behavior: Behavior normal.        Thought Content: Thought content normal.        Judgment: Judgment normal.     Wt Readings from Last 3 Encounters:  03/05/22 165 lb 2 oz (74.9 kg)  01/21/22 166 lb (75.3 kg)  11/09/21 164 lb (74.4 kg)     ASSESSMENT & PLAN:   History of chest pain and dyspnea: She continues to have intermittent episodes of chest discomfort as well as back pain, though symptoms are improved when compared to prior.  Cardiac work-up reassuring including coronary CTA with a calcium score of 0 with no evidence of CAD and echo demonstrating preserved LV systolic function with normal wall motion, no significant valvular abnormalities, and normal RA pressure.  Chest x-ray with minimal subsegmental right basilar atelectasis with no evidence of vascular congestion or effusion.  D-dimer and high-sensitivity troponin negative.  Given reassuring cardiac work-up, no further ischemic testing is indicated at this time.  We will refer her to GI for issues with dysphagia.  Could consider referral to pulmonology.  Otherwise follow-up with PCP.  Thoracic midline back pain/right shoulder and upper extremity pain: Improving.  Follow-up with PCP.  Dysphagia: Refer to GI.  Leukocytosis: Likely inflammatory.  Check CBC, sed rate and CRP.    Disposition: F/u with Dr. Saunders Revel or an APP in 6 months.   Medication Adjustments/Labs and Tests Ordered: Current medicines are reviewed at length with the patient  today.  Concerns regarding medicines are outlined above. Medication changes, Labs and Tests ordered today are summarized above and listed in the Patient Instructions accessible in Encounters.    Signed, Christell Faith, PA-C 03/05/2022 2:59 PM     [place] [address] [place], [place] [address] [phone]

## 2022-03-05 ENCOUNTER — Ambulatory Visit: Payer: BC Managed Care – PPO | Attending: Physician Assistant | Admitting: Physician Assistant

## 2022-03-05 ENCOUNTER — Encounter: Payer: Self-pay | Admitting: Physician Assistant

## 2022-03-05 ENCOUNTER — Other Ambulatory Visit: Payer: Self-pay

## 2022-03-05 ENCOUNTER — Other Ambulatory Visit
Admission: RE | Admit: 2022-03-05 | Discharge: 2022-03-05 | Disposition: A | Discharge status: Home / Self Care | Payer: BC Managed Care – PPO | Source: Ambulatory Visit | Attending: Physician Assistant | Admitting: Physician Assistant

## 2022-03-05 ENCOUNTER — Telehealth: Payer: Self-pay | Admitting: Physician Assistant

## 2022-03-05 VITALS — BP 108/78 | HR 84 | Ht 62.0 in | Wt 165.1 lb

## 2022-03-05 DIAGNOSIS — R0789 Other chest pain: Secondary | ICD-10-CM

## 2022-03-05 DIAGNOSIS — R0602 Shortness of breath: Secondary | ICD-10-CM

## 2022-03-05 DIAGNOSIS — M546 Pain in thoracic spine: Secondary | ICD-10-CM | POA: Diagnosis not present

## 2022-03-05 DIAGNOSIS — R5383 Other fatigue: Secondary | ICD-10-CM | POA: Insufficient documentation

## 2022-03-05 DIAGNOSIS — R1310 Dysphagia, unspecified: Secondary | ICD-10-CM

## 2022-03-05 DIAGNOSIS — D72829 Elevated white blood cell count, unspecified: Secondary | ICD-10-CM

## 2022-03-05 LAB — CBC WITH DIFFERENTIAL/PLATELET
Abs Immature Granulocytes: 0.05 10*3/uL (ref 0.00–0.07)
Basophils Absolute: 0.1 10*3/uL (ref 0.0–0.1)
Basophils Relative: 1 %
Eosinophils Absolute: 0.1 10*3/uL (ref 0.0–0.5)
Eosinophils Relative: 1 %
HCT: 37.5 % (ref 36.0–46.0)
Hemoglobin: 12.2 g/dL (ref 12.0–15.0)
Immature Granulocytes: 1 %
Lymphocytes Relative: 34 %
Lymphs Abs: 3.4 10*3/uL (ref 0.7–4.0)
MCH: 25.8 pg — ABNORMAL LOW (ref 26.0–34.0)
MCHC: 32.5 g/dL (ref 30.0–36.0)
MCV: 79.3 fL — ABNORMAL LOW (ref 80.0–100.0)
Monocytes Absolute: 0.8 10*3/uL (ref 0.1–1.0)
Monocytes Relative: 8 %
Neutro Abs: 5.6 10*3/uL (ref 1.7–7.7)
Neutrophils Relative %: 55 %
Platelets: 335 10*3/uL (ref 150–400)
RBC: 4.73 MIL/uL (ref 3.87–5.11)
RDW: 13.4 % (ref 11.5–15.5)
WBC: 10.1 10*3/uL (ref 4.0–10.5)
nRBC: 0 % (ref 0.0–0.2)

## 2022-03-05 LAB — SEDIMENTATION RATE: Sed Rate: 13 mm/hr (ref 0–30)

## 2022-03-05 LAB — C-REACTIVE PROTEIN: CRP: 1.3 mg/dL — ABNORMAL HIGH (ref <1.0)

## 2022-03-05 NOTE — Patient Instructions (Signed)
Medication Instructions:  No changes at this time.   *If you need a refill on your cardiac medications before your next appointment, please call your pharmacy*   Lab Work: CBC, Sed rate, CRP to be done over at the [facility] and check in at registration.   If you have labs (blood work) drawn today and your tests are completely normal, you will receive your results only by: [place]  (if you have MyChart) OR A paper copy in the mail If you have any lab test that is abnormal or we need to change your treatment, we will call you to review the results.   Testing/Procedures: None   Follow-Up: At [facility], you and your health needs are our priority.  As part of our continuing mission to provide you with exceptional heart care, we have created designated Provider Care Teams.  These Care Teams include your primary Cardiologist (physician) and Advanced Practice Providers (APPs -  Physician Assistants and Nurse Practitioners) who all work together to provide you with the care you need, when you need it.  We recommend signing up for the patient portal called "MyChart".  Sign up information is provided on this After Visit Summary.  MyChart is used to connect with patients for Virtual Visits (Telemedicine).  Patients are able to view lab/test results, encounter notes, upcoming appointments, etc.  Non-urgent messages can be sent to your provider as well.   To learn more about what you can do with MyChart, go to [email or web address].    Your next appointment:   6 month(s)  The format for your next appointment:   In Person  Provider:   Nelva Bush, MD or Christell Faith, PA-C    Other Instructions Referral placed for GI and they should be giving you a call soon.     Important Information About Sugar

## 2022-03-05 NOTE — Telephone Encounter (Signed)
Caller stated the patient is in the lab now and will need order for labs to be drawn.

## 2022-03-05 NOTE — Telephone Encounter (Signed)
Per Martha Henry's note today: Lab Work: CBC, Sed rate, CRP to be done over at the [facility] Orders placed.

## 2022-03-18 DIAGNOSIS — E7800 Pure hypercholesterolemia, unspecified: Secondary | ICD-10-CM | POA: Insufficient documentation

## 2022-03-18 NOTE — Progress Notes (Signed)
BP 129/87   Pulse 76   Temp 97.8 F (36.6 C) (Oral)   Ht '5\' 1"'$  (1.549 m)   Wt 167 lb 3.2 oz (75.8 kg)   SpO2 98%   BMI 31.59 kg/m    Subjective:    Patient ID: Martha Henry, female    DOB: 1966-02-13, 56 y.o.   MRN: [id number]  HPI: Martha Henry is a 55 y.o. female presenting on 03/22/2022 for comprehensive medical examination. Current medical complaints include: GI Symptoms  She currently lives with: Menopausal Symptoms: no  Patient states she isn't having any heart burn but she is waking up in the middle of the night a couple of times a week coughing and choking and feeling like she has to throw up.  States she will have issues where she needs to go to the bathroom.  Feels like she has a stomach bug but it then it goes away the next day.    HYPOTHYROIDISM Thyroid control status:controlled Satisfied with current treatment? yes Medication side effects: no Medication compliance: excellent compliance Etiology of hypothyroidism:  Recent dose adjustment:no Fatigue:some Cold intolerance: no Heat intolerance: no Weight gain: no Weight loss: no Constipation: no Diarrhea/loose stools: yes Palpitations: no Lower extremity edema: no Anxiety/depressed mood: no   Depression Screen done today and results listed below:     03/22/2022    9:05 AM 01/26/2022    8:58 AM 09/17/2021    9:36 AM 06/03/2021    3:30 PM 03/20/2021   11:34 AM  Depression screen PHQ 2/9  Decreased Interest 0 0 0 1 0  Down, Depressed, Hopeless 0 0 0 0 0  PHQ - 2 Score 0 0 0 1 0  Altered sleeping 1 0 0 0 2  Tired, decreased energy 1 0 '1 1 2  '$ Change in appetite 0 0 0 1 0  Feeling bad or failure about yourself  0 0 0 0 0  Trouble concentrating 0 0 0 0 0  Moving slowly or fidgety/restless 0 0 0 0 0  Suicidal thoughts 0 0 0 0 0  PHQ-9 Score 2 0 '1 3 4  '$ Difficult doing work/chores Not difficult at all Not difficult at all Not difficult at all  Not difficult at all    The patient does not  have a history of falls. I did complete a risk assessment for falls. A plan of care for falls was documented.   Past Medical History:  Past Medical History:  Diagnosis Date   COVID-19 07/2019   GERD (gastroesophageal reflux disease)    Headache    sinus   History of abnormal mammogram    seen by Dr. Jamal Collin   Hypothyroidism    ITP (idiopathic thrombocytopenic purpura) 1990   no issues in several years   Low back pain    Tubular adenoma of colon 2016   Tubular adenoma of colon    Urinary incontinence    Wears contact lenses     Surgical History:  Past Surgical History:  Procedure Laterality Date   APPENDECTOMY  2009   BREAST CYST ASPIRATION Left 2019   CHOLECYSTECTOMY  2009   COLONOSCOPY WITH PROPOFOL N/A 02/26/2015   Procedure: COLONOSCOPY WITH PROPOFOL;  Surgeon: Christene Lye, MD;  Location: [facility] ENDOSCOPY;  Service: Endoscopy;  Laterality: N/A;   COLONOSCOPY WITH PROPOFOL N/A 06/13/2020   Procedure: COLONOSCOPY WITH PROPOFOL;  Surgeon: Robert Bellow, MD;  Location: [facility] ENDOSCOPY;  Service: Endoscopy;  Laterality: N/A;   NASAL TURBINATE REDUCTION  Bilateral 05/02/2015   Procedure: TURBINATE REDUCTION/SUBMUCOSAL RESECTION;  Surgeon: Beverly Gust, MD;  Location: [place];  Service: ENT;  Laterality: Bilateral;   SEPTOPLASTY N/A 05/02/2015   Procedure: SEPTOPLASTY;  Surgeon: Beverly Gust, MD;  Location: [place];  Service: ENT;  Laterality: N/A;   [place]  2003   partial/only ovaries remain    Medications:  Current Outpatient Medications on File Prior to Visit  Medication Sig   Calcium-Vitamin D-Vitamin K [id number] MG-UNT-MCG CHEW Chew by mouth.   cetirizine (ZYRTEC) 10 MG tablet Take 10 mg by mouth daily.   Cyanocobalamin (VITAMIN B-12 PO) Take by mouth daily.   fluticasone (FLONASE) 50 MCG/ACT nasal spray USE 2 SPRAY(S) IN EACH NOSTRIL TWICE DAILY   mirabegron ER (MYRBETRIQ) 50 MG TB24  tablet Take 1 tablet (50 mg total) by mouth daily.   Multiple Vitamins-Minerals (MULTIVITAMIN WITH MINERALS) tablet Take by mouth.   Turmeric Curcumin 500 MG CAPS Take by mouth daily.    No current facility-administered medications on file prior to visit.    Allergies:  Allergies  Allergen Reactions   Erythromycin Other (See Comments)    Chest pain   Aspirin Other (See Comments)    Platelet problems   Ciprofloxacin Other (See Comments)    constipation    Social History:  Social History   Socioeconomic History   Marital status: Married    Spouse name: Not on file   Number of children: Not on file   Years of education: Not on file   Highest education level: Not on file  Occupational History   Not on file  Tobacco Use   Smoking status: Never   Smokeless tobacco: Never  Vaping Use   Vaping Use: Never used  Substance and Sexual Activity   Alcohol use: No   Drug use: No   Sexual activity: Yes  Other Topics Concern   Not on file  Social History Narrative   Not on file   Social Determinants of Health   Financial Resource Strain: Not on file  Food Insecurity: Not on file  Transportation Needs: Not on file  Physical Activity: Not on file  Stress: Not on file  Social Connections: Not on file  Intimate Partner Violence: Not on file   Social History   Tobacco Use  Smoking Status Never  Smokeless Tobacco Never   Social History   Substance and Sexual Activity  Alcohol Use No    Family History:  Family History  Problem Relation Age of Onset   Cancer Mother 35       breast   Breast cancer Mother 70   Colon cancer Father 30   Cancer Father        colon   Breast cancer Maternal Aunt    Cancer Maternal Aunt        breast   Ovarian cancer Maternal Aunt        Stage 4   Lung cancer Maternal Grandfather    Cervical cancer Maternal Grandmother     Past medical history, surgical history, medications, allergies, family history and social history reviewed with  patient today and changes made to appropriate areas of the chart.   Review of Systems  Constitutional:  Negative for malaise/fatigue and weight loss.  Respiratory:  Positive for cough.   Cardiovascular:  Negative for palpitations and leg swelling.  Gastrointestinal:  Negative for constipation and diarrhea.  Psychiatric/Behavioral:  Negative for depression. The patient is not nervous/anxious.  All other ROS negative except what is listed above and in the HPI.      Objective:    BP 129/87   Pulse 76   Temp 97.8 F (36.6 C) (Oral)   Ht '5\' 1"'$  (1.549 m)   Wt 167 lb 3.2 oz (75.8 kg)   SpO2 98%   BMI 31.59 kg/m   Wt Readings from Last 3 Encounters:  03/22/22 167 lb 3.2 oz (75.8 kg)  03/05/22 165 lb 2 oz (74.9 kg)  01/21/22 166 lb (75.3 kg)    Physical Exam Vitals and nursing note reviewed.  Constitutional:      General: She is awake. She is not in acute distress.    Appearance: Normal appearance. She is well-developed. She is not ill-appearing.  HENT:     Head: Normocephalic and atraumatic.     Right Ear: Hearing, tympanic membrane, ear canal and external ear normal. No drainage.     Left Ear: Hearing, tympanic membrane, ear canal and external ear normal. No drainage.     Nose: Nose normal.     Right Sinus: No maxillary sinus tenderness or frontal sinus tenderness.     Left Sinus: No maxillary sinus tenderness or frontal sinus tenderness.     Mouth/Throat:     Mouth: Mucous membranes are moist.     Pharynx: Oropharynx is clear. Uvula midline. No pharyngeal swelling, oropharyngeal exudate or posterior oropharyngeal erythema.  Eyes:     General: Lids are normal.        Right eye: No discharge.        Left eye: No discharge.     Extraocular Movements: Extraocular movements intact.     Conjunctiva/sclera: Conjunctivae normal.     Pupils: Pupils are equal, round, and reactive to light.     Visual Fields: Right eye visual fields normal and left eye visual fields normal.  Neck:      Thyroid: No thyromegaly.     Vascular: No carotid bruit.     Trachea: Trachea normal.  Cardiovascular:     Rate and Rhythm: Normal rate and regular rhythm.     Heart sounds: Normal heart sounds. No murmur heard.    No gallop.  Pulmonary:     Effort: Pulmonary effort is normal. No accessory muscle usage or respiratory distress.     Breath sounds: Normal breath sounds.  Chest:  Breasts:    Right: Normal.     Left: Normal.  Abdominal:     General: Bowel sounds are normal.     Palpations: Abdomen is soft. There is no hepatomegaly or splenomegaly.     Tenderness: There is no abdominal tenderness.  Musculoskeletal:        General: Normal range of motion.     Cervical back: Normal range of motion and neck supple.     Right lower leg: No edema.     Left lower leg: No edema.  Lymphadenopathy:     Head:     Right side of head: No submental, submandibular, tonsillar, preauricular or posterior auricular adenopathy.     Left side of head: No submental, submandibular, tonsillar, preauricular or posterior auricular adenopathy.     Cervical: No cervical adenopathy.     Upper Body:     Right upper body: No supraclavicular, axillary or pectoral adenopathy.     Left upper body: No supraclavicular, axillary or pectoral adenopathy.  Skin:    General: Skin is warm and dry.     Capillary Refill: Capillary refill takes  less than 2 seconds.     Findings: No rash.  Neurological:     Mental Status: She is alert and oriented to person, place, and time.     Gait: Gait is intact.  Psychiatric:        Attention and Perception: Attention normal.        Mood and Affect: Mood normal.        Speech: Speech normal.        Behavior: Behavior normal. Behavior is cooperative.        Thought Content: Thought content normal.        Judgment: Judgment normal.     Results for orders placed or performed during the hospital encounter of 03/05/22  C-reactive protein  Result Value Ref Range   CRP 1.3 (H)  <1.0 mg/dL  Sedimentation rate  Result Value Ref Range   Sed Rate 13 0 - 30 mm/hr  CBC w/Diff  Result Value Ref Range   WBC 10.1 4.0 - 10.5 K/uL   RBC 4.73 3.87 - 5.11 MIL/uL   Hemoglobin 12.2 12.0 - 15.0 g/dL   HCT 37.5 36.0 - 46.0 %   MCV 79.3 (L) 80.0 - 100.0 fL   MCH 25.8 (L) 26.0 - 34.0 pg   MCHC 32.5 30.0 - 36.0 g/dL   RDW 13.4 11.5 - 15.5 %   Platelets 335 150 - 400 K/uL   nRBC 0.0 0.0 - 0.2 %   Neutrophils Relative % 55 %   Neutro Abs 5.6 1.7 - 7.7 K/uL   Lymphocytes Relative 34 %   Lymphs Abs 3.4 0.7 - 4.0 K/uL   Monocytes Relative 8 %   Monocytes Absolute 0.8 0.1 - 1.0 K/uL   Eosinophils Relative 1 %   Eosinophils Absolute 0.1 0.0 - 0.5 K/uL   Basophils Relative 1 %   Basophils Absolute 0.1 0.0 - 0.1 K/uL   Immature Granulocytes 1 %   Abs Immature Granulocytes 0.05 0.00 - 0.07 K/uL      Assessment & Plan:   Problem List Items Addressed This Visit       Digestive   GERD (gastroesophageal reflux disease)    Chronic. Not well controlled.  Was given a referral by Cardiology to GI but has not heard about an appt.  New referral placed.      Relevant Medications   lansoprazole (PREVACID) 30 MG capsule   Other Relevant Orders   Ambulatory referral to Gastroenterology     Endocrine   Hypothyroidism    Chronic.  Controlled.  Continue with current medication regimen on regimen of Levothyroxine 64mg and alternating with levothyroxine 779m.  Refills sent today.  Labs ordered today.  Return to clinic in 6 months for reevaluation.  Call sooner if concerns arise.        Relevant Medications   levothyroxine (SYNTHROID) 50 MCG tablet   levothyroxine (SYNTHROID) 75 MCG tablet   Other Relevant Orders   TSH   T4, free     Other   Elevated LDL cholesterol level    Labs ordered at visit today.  Will make recommendations based on lab results.        Relevant Orders   Lipid panel   Other Visit Diagnoses     Annual physical exam    -  Primary   Health  maintenance reviewed during visit today.  Labs ordered. Flu shot given.  Colonoscopy and PAP up to date.   Relevant Orders   CBC with Differential/Platelet   Comprehensive  metabolic panel   Lipid panel   TSH   Urinalysis, Routine w reflex microscopic   T4, free   Hepatitis C Antibody   Encounter for hepatitis C screening test for low risk patient       Relevant Orders   Hepatitis C Antibody   Need for influenza vaccination       Relevant Orders   Flu Vaccine QUAD 6+ mos PF IM (Fluarix Quad PF)        Follow up plan: Return in about 6 months (around 09/20/2022) for HTN, HLD, DM2 FU.   LABORATORY TESTING:  - Pap smear: up to date  IMMUNIZATIONS:   - Tdap: Tetanus vaccination status reviewed: last tetanus booster within 10 years. - Influenza: Administered today - Pneumovax: Not applicable - Prevnar: Not applicable - COVID: Not applicable - HPV: Not applicable - Shingrix vaccine:  Discussed at visit today  SCREENING: -Mammogram: Up to date  - Colonoscopy: Up to date  - Bone Density: Not applicable  -Hearing Test: Not applicable  -Spirometry: Not applicable   PATIENT COUNSELING:   Advised to take 1 mg of folate supplement per day if capable of pregnancy.   Sexuality: Discussed sexually transmitted diseases, partner selection, use of condoms, avoidance of unintended pregnancy  and contraceptive alternatives.   Advised to avoid cigarette smoking.  I discussed with the patient that most people either abstain from alcohol or drink within safe limits (<=14/week and <=4 drinks/occasion for males, <=7/weeks and <= 3 drinks/occasion for females) and that the risk for alcohol disorders and other health effects rises proportionally with the number of drinks per week and how often a drinker exceeds daily limits.  Discussed cessation/primary prevention of drug use and availability of treatment for abuse.   Diet: Encouraged to adjust caloric intake to maintain  or achieve ideal body  weight, to reduce intake of dietary saturated fat and total fat, to limit sodium intake by avoiding high sodium foods and not adding table salt, and to maintain adequate dietary potassium and calcium preferably from fresh fruits, vegetables, and low-fat dairy products.    stressed the importance of regular exercise  Injury prevention: Discussed safety belts, safety helmets, smoke detector, smoking near bedding or upholstery.   Dental health: Discussed importance of regular tooth brushing, flossing, and dental visits.    NEXT PREVENTATIVE PHYSICAL DUE IN 1 YEAR. Return in about 6 months (around 09/20/2022) for HTN, HLD, DM2 FU.

## 2022-03-22 ENCOUNTER — Ambulatory Visit (INDEPENDENT_AMBULATORY_CARE_PROVIDER_SITE_OTHER): Payer: BC Managed Care – PPO | Admitting: Nurse Practitioner

## 2022-03-22 ENCOUNTER — Encounter: Payer: Self-pay | Admitting: Nurse Practitioner

## 2022-03-22 VITALS — BP 129/87 | HR 76 | Temp 97.8°F | Ht 61.0 in | Wt 167.2 lb

## 2022-03-22 DIAGNOSIS — M9901 Segmental and somatic dysfunction of cervical region: Secondary | ICD-10-CM | POA: Diagnosis not present

## 2022-03-22 DIAGNOSIS — Z23 Encounter for immunization: Secondary | ICD-10-CM

## 2022-03-22 DIAGNOSIS — Z0000 Encounter for general adult medical examination without abnormal findings: Secondary | ICD-10-CM

## 2022-03-22 DIAGNOSIS — M25511 Pain in right shoulder: Secondary | ICD-10-CM | POA: Diagnosis not present

## 2022-03-22 DIAGNOSIS — Z1159 Encounter for screening for other viral diseases: Secondary | ICD-10-CM

## 2022-03-22 DIAGNOSIS — M5414 Radiculopathy, thoracic region: Secondary | ICD-10-CM | POA: Diagnosis not present

## 2022-03-22 DIAGNOSIS — M9902 Segmental and somatic dysfunction of thoracic region: Secondary | ICD-10-CM | POA: Diagnosis not present

## 2022-03-22 DIAGNOSIS — R519 Headache, unspecified: Secondary | ICD-10-CM | POA: Diagnosis not present

## 2022-03-22 DIAGNOSIS — E7800 Pure hypercholesterolemia, unspecified: Secondary | ICD-10-CM

## 2022-03-22 DIAGNOSIS — K219 Gastro-esophageal reflux disease without esophagitis: Secondary | ICD-10-CM

## 2022-03-22 DIAGNOSIS — M25531 Pain in right wrist: Secondary | ICD-10-CM | POA: Diagnosis not present

## 2022-03-22 DIAGNOSIS — E039 Hypothyroidism, unspecified: Secondary | ICD-10-CM

## 2022-03-22 LAB — URINALYSIS, ROUTINE W REFLEX MICROSCOPIC
Bilirubin, UA: NEGATIVE
Glucose, UA: NEGATIVE
Ketones, UA: NEGATIVE
Leukocytes,UA: NEGATIVE
Nitrite, UA: NEGATIVE
Protein,UA: NEGATIVE
Specific Gravity, UA: 1.01 (ref 1.005–1.030)
Urobilinogen, Ur: 0.2 mg/dL (ref 0.2–1.0)
pH, UA: 5 (ref 5.0–7.5)

## 2022-03-22 LAB — MICROSCOPIC EXAMINATION: Bacteria, UA: NONE SEEN

## 2022-03-22 MED ORDER — LANSOPRAZOLE 30 MG PO CPDR: 30.0000 mg | DELAYED_RELEASE_CAPSULE | Freq: Every day | ORAL | 1 refills | Status: DC

## 2022-03-22 MED ORDER — LEVOTHYROXINE SODIUM 75 MCG PO TABS: 75.0000 ug | ORAL_TABLET | Freq: Every day | ORAL | 1 refills | Status: DC

## 2022-03-22 MED ORDER — LEVOTHYROXINE SODIUM 50 MCG PO TABS: 50.0000 ug | ORAL_TABLET | Freq: Every day | ORAL | 1 refills | Status: DC

## 2022-03-22 MED ORDER — MONTELUKAST SODIUM 10 MG PO TABS: 10.0000 mg | ORAL_TABLET | Freq: Every day | ORAL | 1 refills | Status: DC

## 2022-03-22 NOTE — Patient Instructions (Addendum)
[facility]

## 2022-03-22 NOTE — Assessment & Plan Note (Signed)
Labs ordered at visit today.  Will make recommendations based on lab results.   

## 2022-03-22 NOTE — Assessment & Plan Note (Signed)
Chronic.  Controlled.  Continue with current medication regimen on regimen of Levothyroxine 77mg and alternating with levothyroxine 740m.  Refills sent today.  Labs ordered today.  Return to clinic in 6 months for reevaluation.  Call sooner if concerns arise.

## 2022-03-22 NOTE — Assessment & Plan Note (Signed)
Chronic. Not well controlled.  Was given a referral by Cardiology to GI but has not heard about an appt.  New referral placed.

## 2022-03-23 LAB — CBC WITH DIFFERENTIAL/PLATELET
Basophils Absolute: 0.1 10*3/uL (ref 0.0–0.2)
Basos: 1 %
EOS (ABSOLUTE): 0.2 10*3/uL (ref 0.0–0.4)
Eos: 2 %
Hematocrit: 41.6 % (ref 34.0–46.6)
Hemoglobin: 13.2 g/dL (ref 11.1–15.9)
Immature Grans (Abs): 0.1 10*3/uL (ref 0.0–0.1)
Immature Granulocytes: 1 %
Lymphocytes Absolute: 2.9 10*3/uL (ref 0.7–3.1)
Lymphs: 29 %
MCH: 26.2 pg — ABNORMAL LOW (ref 26.6–33.0)
MCHC: 31.7 g/dL (ref 31.5–35.7)
MCV: 83 fL (ref 79–97)
Monocytes Absolute: 0.7 10*3/uL (ref 0.1–0.9)
Monocytes: 7 %
Neutrophils Absolute: 6.2 10*3/uL (ref 1.4–7.0)
Neutrophils: 60 %
Platelets: 352 10*3/uL (ref 150–450)
RBC: 5.03 x10E6/uL (ref 3.77–5.28)
RDW: 13.4 % (ref 11.7–15.4)
WBC: 10 10*3/uL (ref 3.4–10.8)

## 2022-03-23 LAB — COMPREHENSIVE METABOLIC PANEL
ALT: 13 IU/L (ref 0–32)
AST: 18 IU/L (ref 0–40)
Albumin/Globulin Ratio: 2.1 (ref 1.2–2.2)
Albumin: 4.4 g/dL (ref 3.8–4.9)
Alkaline Phosphatase: 104 IU/L (ref 44–121)
BUN/Creatinine Ratio: 15 (ref 9–23)
BUN: 12 mg/dL (ref 6–24)
Bilirubin Total: <0.2 mg/dL (ref 0.0–1.2)
CO2: 20 mmol/L (ref 20–29)
Calcium: 9.4 mg/dL (ref 8.7–10.2)
Chloride: 106 mmol/L (ref 96–106)
Creatinine, Ser: 0.82 mg/dL (ref 0.57–1.00)
Globulin, Total: 2.1 g/dL (ref 1.5–4.5)
Glucose: 99 mg/dL (ref 70–99)
Potassium: 4 mmol/L (ref 3.5–5.2)
Sodium: 141 mmol/L (ref 134–144)
Total Protein: 6.5 g/dL (ref 6.0–8.5)
eGFR: 85 mL/min/{1.73_m2} (ref >59)

## 2022-03-23 LAB — LIPID PANEL
Chol/HDL Ratio: 3 ratio (ref 0.0–4.4)
Cholesterol, Total: 188 mg/dL (ref 100–199)
HDL: 63 mg/dL (ref >39)
LDL Chol Calc (NIH): 103 mg/dL — ABNORMAL HIGH (ref 0–99)
Triglycerides: 127 mg/dL (ref 0–149)
VLDL Cholesterol Cal: 22 mg/dL (ref 5–40)

## 2022-03-23 LAB — TSH: TSH: 3.68 u[IU]/mL (ref 0.450–4.500)

## 2022-03-23 LAB — HEPATITIS C ANTIBODY: Hep C Virus Ab: NONREACTIVE

## 2022-03-23 LAB — T4, FREE: Free T4: 1.23 ng/dL (ref 0.82–1.77)

## 2022-03-23 NOTE — Progress Notes (Signed)
Hi Taralee. It was nice to see you yesterday.  Your lab work looks good.  No concerns at this time. Continue with your current medication regimen.  Follow up as discussed.  Please let me know if you have any questions.

## 2022-04-14 DIAGNOSIS — M9902 Segmental and somatic dysfunction of thoracic region: Secondary | ICD-10-CM | POA: Diagnosis not present

## 2022-04-14 DIAGNOSIS — R519 Headache, unspecified: Secondary | ICD-10-CM | POA: Diagnosis not present

## 2022-04-14 DIAGNOSIS — M25531 Pain in right wrist: Secondary | ICD-10-CM | POA: Diagnosis not present

## 2022-04-14 DIAGNOSIS — M9901 Segmental and somatic dysfunction of cervical region: Secondary | ICD-10-CM | POA: Diagnosis not present

## 2022-04-14 DIAGNOSIS — M5414 Radiculopathy, thoracic region: Secondary | ICD-10-CM | POA: Diagnosis not present

## 2022-04-14 DIAGNOSIS — M25511 Pain in right shoulder: Secondary | ICD-10-CM | POA: Diagnosis not present

## 2022-05-12 DIAGNOSIS — R519 Headache, unspecified: Secondary | ICD-10-CM | POA: Diagnosis not present

## 2022-05-12 DIAGNOSIS — M9901 Segmental and somatic dysfunction of cervical region: Secondary | ICD-10-CM | POA: Diagnosis not present

## 2022-05-12 DIAGNOSIS — M9902 Segmental and somatic dysfunction of thoracic region: Secondary | ICD-10-CM | POA: Diagnosis not present

## 2022-05-12 DIAGNOSIS — M25531 Pain in right wrist: Secondary | ICD-10-CM | POA: Diagnosis not present

## 2022-05-12 DIAGNOSIS — M5414 Radiculopathy, thoracic region: Secondary | ICD-10-CM | POA: Diagnosis not present

## 2022-05-12 DIAGNOSIS — M25511 Pain in right shoulder: Secondary | ICD-10-CM | POA: Diagnosis not present

## 2022-06-02 DIAGNOSIS — M9901 Segmental and somatic dysfunction of cervical region: Secondary | ICD-10-CM | POA: Diagnosis not present

## 2022-06-02 DIAGNOSIS — R519 Headache, unspecified: Secondary | ICD-10-CM | POA: Diagnosis not present

## 2022-06-02 DIAGNOSIS — M5414 Radiculopathy, thoracic region: Secondary | ICD-10-CM | POA: Diagnosis not present

## 2022-06-02 DIAGNOSIS — M25531 Pain in right wrist: Secondary | ICD-10-CM | POA: Diagnosis not present

## 2022-06-02 DIAGNOSIS — M25511 Pain in right shoulder: Secondary | ICD-10-CM | POA: Diagnosis not present

## 2022-06-02 DIAGNOSIS — M9902 Segmental and somatic dysfunction of thoracic region: Secondary | ICD-10-CM | POA: Diagnosis not present

## 2022-06-09 ENCOUNTER — Encounter: Payer: Self-pay | Admitting: Gastroenterology

## 2022-06-09 ENCOUNTER — Telehealth: Payer: BC Managed Care – PPO | Admitting: Gastroenterology

## 2022-06-09 DIAGNOSIS — R1319 Other dysphagia: Secondary | ICD-10-CM

## 2022-06-09 DIAGNOSIS — K219 Gastro-esophageal reflux disease without esophagitis: Secondary | ICD-10-CM

## 2022-06-09 NOTE — Addendum Note (Signed)
Addended by: Lurlean Nanny on: 06/09/2022 03:59 PM   Modules accepted: Orders

## 2022-06-09 NOTE — Progress Notes (Signed)
[name], MD [address]  [place]  [place], [place] [address]  Main: [phone]  Fax: [phone]    Gastroenterology Virtual/Video Visit  Referring Provider:     Jon Billings, NP Primary Care Physician:  Jon Billings, NP Primary Gastroenterologist:  Dr.Meral Geissinger Allen Norris Reason for Consultation:     GERD        HPI:    Virtual Visit via Video Note Location of the patient: Home Location of provider: Home  Participating persons: The patient and myself.  I connected with Beverely Risen on 06/09/22 at  9:30 AM EST by a video enabled telemedicine application and verified that I am speaking with the correct person using two identifiers.   I discussed the limitations of evaluation and management by telemedicine and the availability of in person appointments. The patient expressed understanding and agreed to proceed.  Verbal consent to proceed obtained.  History of Present Illness: Martha Henry is a 57 y.o. female referred by Dr. Jon Billings, NP  for consultation & management of GERD. This patient comes in today after being seen in the past by Dr. Jamal Collin are in 2016 for colonoscopy and a repeat colonoscopy by Dr. Bary Castilla in 2022 for history of colon polyps. It was recommended that the patient have repeat colonoscopy in 7 years after the last colonoscopy. The patient was seen by her primary care provider back in November and at that time reported that she was having episodes of waking up in the middle the night with coughing and choking and feeling that she is going to throw up but denied any heartburn.  She then deals that she has a stomach bug that then goes away the very next day.  The patient was reported to be on Prevacid and was referred to GI for these issues. The patient's CMP was normal and her hemoglobin and hematocrit were also normal back in November. The patient reports that she will was evaluated by cardiology for multiple episodes of chest  pain and had a negative workup.  She also states that she has some pain in her chest when she reaches her arm over to pluck her eyebrows on the opposite side.  She also states that she has these twinges of pain in her leg that go away in a couple seconds.  The patient states that she has a father with colon cancer before the age of 55. She also reports that she feels like bulky foods sometimes have a hard time going down.  Past Medical History:  Diagnosis Date   COVID-19 07/2019   GERD (gastroesophageal reflux disease)    Headache    sinus   History of abnormal mammogram    seen by Dr. Jamal Collin   Hypothyroidism    ITP (idiopathic thrombocytopenic purpura) 1990   no issues in several years   Low back pain    Tubular adenoma of colon 2016   Tubular adenoma of colon    Urinary incontinence    Wears contact lenses     Past Surgical History:  Procedure Laterality Date   APPENDECTOMY  2009   BREAST CYST ASPIRATION Left 2019   CHOLECYSTECTOMY  2009   COLONOSCOPY WITH PROPOFOL N/A 02/26/2015   Procedure: COLONOSCOPY WITH PROPOFOL;  Surgeon: Christene Lye, MD;  Location: [facility];  Service: Endoscopy;  Laterality: N/A;   COLONOSCOPY WITH PROPOFOL N/A 06/13/2020   Procedure: COLONOSCOPY WITH PROPOFOL;  Surgeon: Robert Bellow, MD;  Location: [facility];  Service: Endoscopy;  Laterality: N/A;   NASAL TURBINATE REDUCTION Bilateral 05/02/2015   Procedure: TURBINATE REDUCTION/SUBMUCOSAL RESECTION;  Surgeon: Beverly Gust, MD;  Location: [place];  Service: ENT;  Laterality: Bilateral;   SEPTOPLASTY N/A 05/02/2015   Procedure: SEPTOPLASTY;  Surgeon: Beverly Gust, MD;  Location: [place];  Service: ENT;  Laterality: N/A;   [place]  2003   partial/only ovaries remain    Prior to Admission medications   Medication Sig Start Date End Date Taking? Authorizing Provider  Calcium-Vitamin D-Vitamin K [phone]  MG-UNT-MCG CHEW Chew by mouth.    [provider]  cetirizine (ZYRTEC) 10 MG tablet Take 10 mg by mouth daily.    [provider]  Cyanocobalamin (VITAMIN B-12 PO) Take by mouth daily.    [provider]  fluticasone Asencion Islam) 50 MCG/ACT nasal spray USE 2 SPRAY(S) IN EACH NOSTRIL TWICE DAILY 07/12/20   Jon Billings, NP  lansoprazole (PREVACID) 30 MG capsule Take 1 capsule (30 mg total) by mouth daily. 03/22/22   Jon Billings, NP  levothyroxine (SYNTHROID) 50 MCG tablet Take 1 tablet (50 mcg total) by mouth daily. 03/22/22   Jon Billings, NP  levothyroxine (SYNTHROID) 75 MCG tablet Take 1 tablet (75 mcg total) by mouth daily before breakfast. 03/22/22   Jon Billings, NP  mirabegron ER (MYRBETRIQ) 50 MG TB24 tablet Take 1 tablet (50 mg total) by mouth daily. 11/09/21   MacDiarmid, Nicki Reaper, MD  montelukast (SINGULAIR) 10 MG tablet Take 1 tablet (10 mg total) by mouth at bedtime. 03/22/22   Jon Billings, NP  Multiple Vitamins-Minerals (MULTIVITAMIN WITH MINERALS) tablet Take by mouth.    [provider]  Turmeric Curcumin 500 MG CAPS Take by mouth daily.     [provider]    Family History  Problem Relation Age of Onset   Cancer Mother 44       breast   Breast cancer Mother 30   Colon cancer Father 19   Cancer Father        colon   Breast cancer Maternal Aunt    Cancer Maternal Aunt        breast   Ovarian cancer Maternal Aunt        Stage 4   Lung cancer Maternal Grandfather    Cervical cancer Maternal Grandmother      Social History   Tobacco Use   Smoking status: Never   Smokeless tobacco: Never  Vaping Use   Vaping Use: Never used  Substance Use Topics   Alcohol use: No   Drug use: No    Allergies as of 06/09/2022 - Review Complete 03/22/2022  Allergen Reaction Noted   Erythromycin Other (See Comments) 07/31/2012   Aspirin Other (See Comments) 09/07/2012   Ciprofloxacin Other (See Comments) 04/23/2015     Review of Systems:    All systems reviewed and negative except where noted in HPI.   Observations/Objective:  Labs: CBC    Component Value Date/Time   WBC 10.0 03/22/2022 0923   WBC 10.1 03/05/2022 1518   RBC 5.03 03/22/2022 0923   RBC 4.73 03/05/2022 1518   HGB 13.2 03/22/2022 0923   HCT 41.6 03/22/2022 0923   PLT 352 03/22/2022 0923   MCV 83 03/22/2022 0923   MCH 26.2 (L) 03/22/2022 0923   MCH 25.8 (L) 03/05/2022 1518   MCHC 31.7 03/22/2022 0923   MCHC 32.5 03/05/2022 1518   RDW 13.4 03/22/2022 0923   LYMPHSABS 2.9 03/22/2022 3710  MONOABS 0.8 03/05/2022 1518   EOSABS 0.2 03/22/2022 0923   BASOSABS 0.1 03/22/2022 0923   CMP     Component Value Date/Time   NA 141 03/22/2022 0923   K 4.0 03/22/2022 0923   CL 106 03/22/2022 0923   CO2 20 03/22/2022 0923   GLUCOSE 99 03/22/2022 0923   GLUCOSE 85 01/21/2022 0951   BUN 12 03/22/2022 0923   CREATININE 0.82 03/22/2022 0923   CALCIUM 9.4 03/22/2022 0923   PROT 6.5 03/22/2022 0923   ALBUMIN 4.4 03/22/2022 0923   AST 18 03/22/2022 0923   ALT 13 03/22/2022 0923   ALKPHOS 104 03/22/2022 0923   BILITOT <0.2 03/22/2022 0923   GFRNONAA >60 01/21/2022 0951   GFRAA 104 03/18/2020 0935    Imaging Studies: No results found.  Assessment and Plan:   Dereon Cydnee Fuquay is a 57 y.o. y/o female has Reflux symptoms with chest pain and feels better when she takes her Prevacid daily.  The patient also has some dysphagia.  She has a father with colon cancer before the age of 5.  The patient has been told that due to her family history she should have a colonoscopy every 5 years not at 7 years as recommended by her last position.  The patient also will be set up for an EGD in the next available appointment because of her chronic GERD-like symptoms and dysphagia.  The patient has been explained the plan and agrees with it.  Follow Up Instructions:  I discussed the assessment and treatment plan with the patient. The patient was  provided an opportunity to ask questions and all were answered. The patient agreed with the plan and demonstrated an understanding of the instructions.   The patient was advised to call back or seek an in-person evaluation if the symptoms worsen or if the condition fails to improve as anticipated.  I provided 35 minutes of non-face-to-face time during this encounter including chart review In preparation for the encounter.   [name], MD  Speech recognition software was used to dictate the above note.

## 2022-06-15 DIAGNOSIS — S90122A Contusion of left lesser toe(s) without damage to nail, initial encounter: Secondary | ICD-10-CM | POA: Diagnosis not present

## 2022-06-15 DIAGNOSIS — S9032XA Contusion of left foot, initial encounter: Secondary | ICD-10-CM | POA: Diagnosis not present

## 2022-06-15 DIAGNOSIS — M7672 Peroneal tendinitis, left leg: Secondary | ICD-10-CM | POA: Diagnosis not present

## 2022-06-16 ENCOUNTER — Other Ambulatory Visit: Payer: Self-pay

## 2022-06-16 ENCOUNTER — Encounter: Payer: Self-pay | Admitting: Gastroenterology

## 2022-06-18 NOTE — Anesthesia Preprocedure Evaluation (Signed)
Anesthesia Evaluation  Patient identified by MRN, date of birth, ID band Patient awake    Reviewed: Allergy & Precautions, NPO status , Patient's Chart, lab work & pertinent test results  History of Anesthesia Complications Negative for: history of anesthetic complications  Airway Mallampati: III  TM Distance: <3 FB Neck ROM: Full    Dental no notable dental hx. (+) Teeth Intact   Pulmonary neg shortness of breath, neg sleep apnea, neg COPD, Patient abstained from smoking.Not current smoker   Pulmonary exam normal breath sounds clear to auscultation       Cardiovascular Exercise Tolerance: Good METS(-) hypertension(-) CAD and (-) Past MI negative cardio ROS (-) dysrhythmias  Rhythm:Regular Rate:Normal - Systolic murmurs TTE unremarkable; low risk exercise stress test without ischemia   Neuro/Psych neg Headaches  negative psych ROS   GI/Hepatic ,GERD  Controlled,,(+)     (-) substance abuse    Endo/Other  neg diabetesHypothyroidism    Renal/GU negative Renal ROS     Musculoskeletal   Abdominal  (+) + obese  Peds  Hematology   Anesthesia Other Findings Past Medical History: 07/2019: COVID-19 No date: GERD (gastroesophageal reflux disease) No date: Headache     Comment:  sinus No date: History of abnormal mammogram     Comment:  seen by Dr. Jamal Collin No date: Hypothyroidism 1990: ITP (idiopathic thrombocytopenic purpura)     Comment:  no issues in several years No date: Low back pain 2016: Tubular adenoma of colon No date: Tubular adenoma of colon No date: Urinary incontinence No date: Wears contact lenses  Reproductive/Obstetrics                             Anesthesia Physical Anesthesia Plan  ASA: II  Anesthesia Plan: General   Post-op Pain Management:    Induction: Intravenous  PONV Risk Score and Plan: 3 and Propofol infusion and TIVA  Airway Management Planned: Nasal  Cannula  Additional Equipment: None  Intra-op Plan:   Post-operative Plan:   Informed Consent: I have reviewed the patients History and Physical, chart, labs and discussed the procedure including the risks, benefits and alternatives for the proposed anesthesia with the patient or authorized representative who has indicated his/her understanding and acceptance.     Dental advisory given  Plan Discussed with: CRNA and Surgeon  Anesthesia Plan Comments:         Anesthesia Quick Evaluation

## 2022-06-21 ENCOUNTER — Other Ambulatory Visit: Payer: Self-pay

## 2022-06-21 ENCOUNTER — Encounter
Admission: RE | Disposition: A | Discharge status: Home / Self Care | Payer: Self-pay | Source: Home / Self Care | Attending: Gastroenterology

## 2022-06-21 ENCOUNTER — Ambulatory Visit: Payer: BC Managed Care – PPO | Admitting: Anesthesiology

## 2022-06-21 ENCOUNTER — Encounter: Payer: Self-pay | Admitting: Gastroenterology

## 2022-06-21 ENCOUNTER — Ambulatory Visit
Admission: RE | Admit: 2022-06-21 | Discharge: 2022-06-21 | Disposition: A | Discharge status: Home / Self Care | Payer: BC Managed Care – PPO | Attending: Gastroenterology | Admitting: Gastroenterology

## 2022-06-21 DIAGNOSIS — K222 Esophageal obstruction: Secondary | ICD-10-CM | POA: Insufficient documentation

## 2022-06-21 DIAGNOSIS — R1319 Other dysphagia: Secondary | ICD-10-CM | POA: Diagnosis not present

## 2022-06-21 DIAGNOSIS — K219 Gastro-esophageal reflux disease without esophagitis: Secondary | ICD-10-CM | POA: Insufficient documentation

## 2022-06-21 DIAGNOSIS — K449 Diaphragmatic hernia without obstruction or gangrene: Secondary | ICD-10-CM | POA: Insufficient documentation

## 2022-06-21 DIAGNOSIS — Z09 Encounter for follow-up examination after completed treatment for conditions other than malignant neoplasm: Secondary | ICD-10-CM | POA: Insufficient documentation

## 2022-06-21 DIAGNOSIS — E039 Hypothyroidism, unspecified: Secondary | ICD-10-CM | POA: Diagnosis not present

## 2022-06-21 DIAGNOSIS — R1310 Dysphagia, unspecified: Secondary | ICD-10-CM | POA: Diagnosis not present

## 2022-06-21 DIAGNOSIS — K317 Polyp of stomach and duodenum: Secondary | ICD-10-CM | POA: Diagnosis not present

## 2022-06-21 DIAGNOSIS — R12 Heartburn: Secondary | ICD-10-CM | POA: Diagnosis not present

## 2022-06-21 DIAGNOSIS — Z8616 Personal history of COVID-19: Secondary | ICD-10-CM | POA: Insufficient documentation

## 2022-06-21 HISTORY — DX: Other allergic rhinitis: J30.89

## 2022-06-21 HISTORY — PX: ESOPHAGOGASTRODUODENOSCOPY (EGD) WITH PROPOFOL: SHX5813

## 2022-06-21 SURGERY — ESOPHAGOGASTRODUODENOSCOPY (EGD) WITH PROPOFOL
Anesthesia: General | Site: Esophagus

## 2022-06-21 MED ORDER — LIDOCAINE HCL (CARDIAC) PF 100 MG/5ML IV SOSY
PREFILLED_SYRINGE | INTRAVENOUS | Status: DC | PRN
  Administered 2022-06-21: 100 mg via INTRAVENOUS

## 2022-06-21 MED ORDER — LACTATED RINGERS IV SOLN: INTRAVENOUS | Status: DC

## 2022-06-21 MED ORDER — PROPOFOL 10 MG/ML IV BOLUS
INTRAVENOUS | Status: DC | PRN
  Administered 2022-06-21: 110 mg via INTRAVENOUS
  Administered 2022-06-21 (×2): 30 mg via INTRAVENOUS

## 2022-06-21 MED ORDER — SODIUM CHLORIDE 0.9 % IV SOLN: INTRAVENOUS | Status: DC

## 2022-06-21 MED ORDER — GLYCOPYRROLATE 0.2 MG/ML IJ SOLN
INTRAMUSCULAR | Status: DC | PRN
  Administered 2022-06-21: .2 mg via INTRAVENOUS

## 2022-06-21 SURGICAL SUPPLY — 32 items
BALLN DILATOR 12-15 8 (BALLOONS)
BALLN DILATOR 15-18 8 (BALLOONS) ×1
BALLN DILATOR CRE 0-12 8 (BALLOONS)
BALLN DILATOR ESOPH 8 10 CRE (MISCELLANEOUS) IMPLANT
BALLOON DILATOR 12-15 8 (BALLOONS) IMPLANT
BALLOON DILATOR 15-18 8 (BALLOONS) IMPLANT
BALLOON DILATOR CRE 0-12 8 (BALLOONS) IMPLANT
BLOCK BITE 60FR ADLT L/F GRN (MISCELLANEOUS) ×1 IMPLANT
CLIP HMST 235XBRD CATH ROT (MISCELLANEOUS) IMPLANT
CLIP RESOLUTION 360 11X235 (MISCELLANEOUS)
ELECT REM PT RETURN 9FT ADLT (ELECTROSURGICAL)
ELECTRODE REM PT RTRN 9FT ADLT (ELECTROSURGICAL) IMPLANT
FCP ESCP3.2XJMB 240X2.8X (MISCELLANEOUS)
FORCEPS BIOP RAD 4 LRG CAP 4 (CUTTING FORCEPS) IMPLANT
FORCEPS BIOP RJ4 240 W/NDL (MISCELLANEOUS)
FORCEPS ESCP3.2XJMB 240X2.8X (MISCELLANEOUS) IMPLANT
GOWN CVR UNV OPN BCK APRN NK (MISCELLANEOUS) ×2 IMPLANT
GOWN ISOL THUMB LOOP REG UNIV (MISCELLANEOUS) ×2
INJECTOR VARIJECT VIN23 (MISCELLANEOUS) IMPLANT
KIT DEFENDO VALVE AND CONN (KITS) IMPLANT
KIT PRC NS LF DISP ENDO (KITS) ×1 IMPLANT
KIT PROCEDURE OLYMPUS (KITS) ×1
MANIFOLD NEPTUNE II (INSTRUMENTS) ×1 IMPLANT
MARKER SPOT ENDO TATTOO 5ML (MISCELLANEOUS) IMPLANT
RETRIEVER NET PLAT FOOD (MISCELLANEOUS) IMPLANT
SNARE SHORT THROW 13M SML OVAL (MISCELLANEOUS) IMPLANT
SNARE SHORT THROW 30M LRG OVAL (MISCELLANEOUS) IMPLANT
SYR INFLATION 60ML (SYRINGE) IMPLANT
TRAP ETRAP POLY (MISCELLANEOUS) IMPLANT
VARIJECT INJECTOR VIN23 (MISCELLANEOUS)
WATER STERILE IRR 250ML POUR (IV SOLUTION) ×1 IMPLANT
WIRE CRE 18-20MM 8CM F G (MISCELLANEOUS) IMPLANT

## 2022-06-21 NOTE — Transfer of Care (Signed)
Immediate Anesthesia Transfer of Care Note  Patient: Martha Henry  Procedure(s) Performed: ESOPHAGOGASTRODUODENOSCOPY (EGD) WITH PROPOFOL (Esophagus) ESOPHAGEAL DILITATION (Esophagus)  Patient Location: PACU  Anesthesia Type: General  Level of Consciousness: awake, alert  and patient cooperative  Airway and Oxygen Therapy: Patient Spontanous Breathing and Patient connected to supplemental oxygen  Post-op Assessment: Post-op Vital signs reviewed, Patient's Cardiovascular Status Stable, Respiratory Function Stable, Patent Airway and No signs of Nausea or vomiting  Post-op Vital Signs: Reviewed and stable  Complications: No notable events documented.

## 2022-06-21 NOTE — H&P (Signed)
Martha Lame, MD [facility] [address] [place], [place] [address] Phone:[phone] Fax : [phone]  Primary Care Physician:  Jon Billings, NP Primary Gastroenterologist:  Dr. Allen Norris  Pre-Procedure History & Physical: HPI:  Martha Henry is a 57 y.o. female is here for an endoscopy.   Past Medical History:  Diagnosis Date   COVID-19 07/2019   Environmental and seasonal allergies    GERD (gastroesophageal reflux disease)    Headache    sinus   History of abnormal mammogram    seen by Dr. Jamal Collin   Hypothyroidism    ITP (idiopathic thrombocytopenic purpura) 1990   no issues in several years   Low back pain    Tubular adenoma of colon 2016   Urinary incontinence    Wears contact lenses     Past Surgical History:  Procedure Laterality Date   APPENDECTOMY  2009   BREAST CYST ASPIRATION Left 2019   CHOLECYSTECTOMY  2009   COLONOSCOPY WITH PROPOFOL N/A 02/26/2015   Procedure: COLONOSCOPY WITH PROPOFOL;  Surgeon: Christene Lye, MD;  Location: [facility];  Service: Endoscopy;  Laterality: N/A;   COLONOSCOPY WITH PROPOFOL N/A 06/13/2020   Procedure: COLONOSCOPY WITH PROPOFOL;  Surgeon: Robert Bellow, MD;  Location: [facility];  Service: Endoscopy;  Laterality: N/A;   NASAL TURBINATE REDUCTION Bilateral 05/02/2015   Procedure: TURBINATE REDUCTION/SUBMUCOSAL RESECTION;  Surgeon: Beverly Gust, MD;  Location: [place];  Service: ENT;  Laterality: Bilateral;   SEPTOPLASTY N/A 05/02/2015   Procedure: SEPTOPLASTY;  Surgeon: Beverly Gust, MD;  Location: [place];  Service: ENT;  Laterality: N/A;   [place]  2003   partial/only ovaries remain    Prior to Admission medications   Medication Sig Start Date End Date Taking? Authorizing Provider  Calcium-Vitamin D-Vitamin K [id number] MG-UNT-MCG CHEW Chew by mouth.   Yes [provider]  cetirizine (ZYRTEC) 10 MG tablet  Take 10 mg by mouth daily.   Yes [provider]  Cholecalciferol (VITAMIN D3) 50 MCG (2000 UT) CHEW Chew by mouth.   Yes [provider]  Cyanocobalamin (VITAMIN B-12 PO) Take by mouth daily.   Yes [provider]  fluticasone (FLONASE) 50 MCG/ACT nasal spray USE 2 SPRAY(S) IN EACH NOSTRIL TWICE DAILY 07/12/20  Yes Jon Billings, NP  lansoprazole (PREVACID) 30 MG capsule Take 1 capsule (30 mg total) by mouth daily. 03/22/22  Yes Jon Billings, NP  levothyroxine (SYNTHROID) 50 MCG tablet Take 1 tablet (50 mcg total) by mouth daily. Patient taking differently: Take 50 mcg by mouth daily. Cain Saupe, Sat 03/22/22  Yes Jon Billings, NP  mirabegron ER (MYRBETRIQ) 50 MG TB24 tablet Take 1 tablet (50 mg total) by mouth daily. 11/09/21  Yes MacDiarmid, Nicki Reaper, MD  montelukast (SINGULAIR) 10 MG tablet Take 1 tablet (10 mg total) by mouth at bedtime. 03/22/22  Yes Jon Billings, NP  Multiple Vitamins-Minerals (MULTIVITAMIN WITH MINERALS) tablet Take by mouth.   Yes [provider]  predniSONE (STERAPRED UNI-PAK 21 TAB) 5 MG (21) TBPK tablet Take 5 mg by mouth daily.   Yes [provider]  Turmeric Curcumin 500 MG CAPS Take by mouth daily.    Yes [provider]  levothyroxine (SYNTHROID) 75 MCG tablet Take 1 tablet (75 mcg total) by mouth daily before breakfast. Patient taking differently: Take 75 mcg by mouth daily before breakfast. M,W,F,Sun 03/22/22   Jon Billings, NP    Allergies as of 06/09/2022 - Review  Complete 06/09/2022  Allergen Reaction Noted   Erythromycin Other (See Comments) 07/31/2012   Aspirin Other (See Comments) 09/07/2012   Ciprofloxacin Other (See Comments) 04/23/2015    Family History  Problem Relation Age of Onset   Cancer Mother 52       breast   Breast cancer Mother 20   Colon cancer Father 50   Cancer Father        colon   Breast cancer Maternal Aunt    Cancer Maternal Aunt        breast    Ovarian cancer Maternal Aunt        Stage 4   Lung cancer Maternal Grandfather    Cervical cancer Maternal Grandmother     Social History   Socioeconomic History   Marital status: Married    Spouse name: Not on file   Number of children: Not on file   Years of education: Not on file   Highest education level: Not on file  Occupational History   Not on file  Tobacco Use   Smoking status: Never   Smokeless tobacco: Never  Vaping Use   Vaping Use: Never used  Substance and Sexual Activity   Alcohol use: No   Drug use: No   Sexual activity: Yes  Other Topics Concern   Not on file  Social History Narrative   Not on file   Social Determinants of Health   Financial Resource Strain: Not on file  Food Insecurity: Not on file  Transportation Needs: Not on file  Physical Activity: Not on file  Stress: Not on file  Social Connections: Not on file  Intimate Partner Violence: Not on file    Review of Systems: See HPI, otherwise negative ROS  Physical Exam: BP (!) 143/90   Pulse 80   Temp (!) 97.3 F (36.3 C) (Temporal)   Resp 16   Ht 5' 2"$  (1.575 m)   Wt 74.4 kg   SpO2 98%   BMI 30.00 kg/m  General:   Alert,  pleasant and cooperative in NAD Head:  Normocephalic and atraumatic. Neck:  Supple; no masses or thyromegaly. Lungs:  Clear throughout to auscultation.    Heart:  Regular rate and rhythm. Abdomen:  Soft, nontender and nondistended. Normal bowel sounds, without guarding, and without rebound.   Neurologic:  Alert and  oriented x4;  grossly normal neurologically.  Impression/Plan: Martha Henry is here for an endoscopy to be performed for dysphagia  Risks, benefits, limitations, and alternatives regarding  endoscopy have been reviewed with the patient.  Questions have been answered.  All parties agreeable.   Martha Lame, MD  06/21/2022, 10:42 AM

## 2022-06-21 NOTE — Anesthesia Postprocedure Evaluation (Signed)
Anesthesia Post Note  Patient: Martha Henry  Procedure(s) Performed: ESOPHAGOGASTRODUODENOSCOPY (EGD) WITH PROPOFOL (Esophagus) ESOPHAGEAL DILITATION (Esophagus)  Patient location during evaluation: PACU Anesthesia Type: General Level of consciousness: awake and alert Pain management: pain level controlled Vital Signs Assessment: post-procedure vital signs reviewed and stable Respiratory status: spontaneous breathing, nonlabored ventilation and respiratory function stable Cardiovascular status: blood pressure returned to baseline and stable Postop Assessment: no apparent nausea or vomiting Anesthetic complications: no   No notable events documented.   Last Vitals:  Vitals:   06/21/22 1220 06/21/22 1227  BP: (!) 116/97 (!) 136/94  Pulse:    Resp:    Temp:    SpO2:      Last Pain:  Vitals:   06/21/22 1220  TempSrc:   PainSc: 0-No pain                 [place] Ouch

## 2022-06-21 NOTE — Op Note (Signed)
[facility] Gastroenterology Patient Name: Martha Henry Procedure Date: 06/21/2022 11:41 AM MRN: [id number] Account #: [id number] Date of Birth: 1965-11-10 Admit Type: Outpatient Age: 57 Room: [facility] OR ROOM 01 Gender: Female Note Status: Finalized Instrument Name: [id number] Procedure:             Upper GI endoscopy Indications:           Dysphagia, Heartburn Providers:             [name] MD, MD Referring MD:          Jon Billings (Referring MD) Medicines:             Propofol per Anesthesia Complications:         No immediate complications. Procedure:             Pre-Anesthesia Assessment:                        - Prior to the procedure, a History and Physical was                         performed, and patient medications and allergies were                         reviewed. The patient's tolerance of previous                         anesthesia was also reviewed. The risks and benefits                         of the procedure and the sedation options and risks                         were discussed with the patient. All questions were                         answered, and informed consent was obtained. Prior                         Anticoagulants: The patient has taken no anticoagulant                         or antiplatelet agents. ASA Grade Assessment: II - A                         patient with mild systemic disease. After reviewing                         the risks and benefits, the patient was deemed in                         satisfactory condition to undergo the procedure.                        After obtaining informed consent, the endoscope was                         passed under direct vision. Throughout the procedure,  the patient's blood pressure, pulse, and oxygen                         saturations were monitored continuously. The Endoscope                         was introduced through the mouth, and advanced to the                          second part of duodenum. The upper GI endoscopy was                         accomplished without difficulty. The patient tolerated                         the procedure well. Findings:      One benign-appearing, intrinsic mild stenosis was found at the       gastroesophageal junction. The stenosis was traversed. A TTS dilator was       passed through the scope. Dilation with a 15-16.5-18 mm balloon dilator       was performed to 18 mm. The dilation site was examined following       endoscope reinsertion and showed complete resolution of luminal       narrowing.      A small hiatal hernia was present.      Multiple pedunculated and sessile polyps with no bleeding and no       stigmata of recent bleeding were found in the gastric fundus.      The examined duodenum was normal.      Two biopsies were obtained with cold forceps for histology in the middle       third of the esophagus. Impression:            - Benign-appearing esophageal stenosis. Dilated.                        - Small hiatal hernia.                        - Multiple gastric polyps.                        - Normal examined duodenum.                        - Biopsy performed in the middle third of the                         esophagus. Recommendation:        - Discharge patient to home.                        - Resume previous diet.                        - Continue present medications.                        - Await pathology results. Procedure Code(s):     --- Professional ---  [phone], Esophagogastroduodenoscopy, flexible,                         transoral; with transendoscopic balloon dilation of                         esophagus (less than 30 mm diameter)                        43239, 59, Esophagogastroduodenoscopy, flexible,                         transoral; with biopsy, single or multiple Diagnosis Code(s):     --- Professional ---                        R13.10, Dysphagia,  unspecified                        R12, Heartburn                        K22.2, Esophageal obstruction CPT copyright 2022 American Medical Association. All rights reserved. The codes documented in this report are preliminary and upon coder review may  be revised to meet current compliance requirements. [name] MD, MD 06/21/2022 11:58:35 AM This report has been signed electronically. Number of Addenda: 0 Note Initiated On: 06/21/2022 11:41 AM Total Procedure Duration: 0 hours 4 minutes 42 seconds  Estimated Blood Loss:  Estimated blood loss: none. Estimated blood loss: none.      [facility]

## 2022-06-22 ENCOUNTER — Encounter: Payer: Self-pay | Admitting: Gastroenterology

## 2022-06-23 ENCOUNTER — Encounter: Payer: Self-pay | Admitting: Gastroenterology

## 2022-06-23 LAB — SURGICAL PATHOLOGY

## 2022-06-29 DIAGNOSIS — R519 Headache, unspecified: Secondary | ICD-10-CM | POA: Diagnosis not present

## 2022-06-29 DIAGNOSIS — M9901 Segmental and somatic dysfunction of cervical region: Secondary | ICD-10-CM | POA: Diagnosis not present

## 2022-06-29 DIAGNOSIS — M25531 Pain in right wrist: Secondary | ICD-10-CM | POA: Diagnosis not present

## 2022-06-29 DIAGNOSIS — M25511 Pain in right shoulder: Secondary | ICD-10-CM | POA: Diagnosis not present

## 2022-06-29 DIAGNOSIS — M9902 Segmental and somatic dysfunction of thoracic region: Secondary | ICD-10-CM | POA: Diagnosis not present

## 2022-06-29 DIAGNOSIS — M5414 Radiculopathy, thoracic region: Secondary | ICD-10-CM | POA: Diagnosis not present

## 2022-07-28 DIAGNOSIS — R519 Headache, unspecified: Secondary | ICD-10-CM | POA: Diagnosis not present

## 2022-07-28 DIAGNOSIS — M9901 Segmental and somatic dysfunction of cervical region: Secondary | ICD-10-CM | POA: Diagnosis not present

## 2022-07-28 DIAGNOSIS — M25511 Pain in right shoulder: Secondary | ICD-10-CM | POA: Diagnosis not present

## 2022-07-28 DIAGNOSIS — M5414 Radiculopathy, thoracic region: Secondary | ICD-10-CM | POA: Diagnosis not present

## 2022-07-28 DIAGNOSIS — M9902 Segmental and somatic dysfunction of thoracic region: Secondary | ICD-10-CM | POA: Diagnosis not present

## 2022-07-28 DIAGNOSIS — M25531 Pain in right wrist: Secondary | ICD-10-CM | POA: Diagnosis not present

## 2022-08-19 NOTE — Progress Notes (Deleted)
Cardiology Office Note    Date:  08/19/2022   ID:  Martha Henry [birth year], MRN [id number]  PCP:  Larae Grooms, NP  Cardiologist:  Yvonne Kendall, MD  Electrophysiologist:  None   Chief Complaint: Follow up  History of Present Illness:   Martha Henry is a 57 y.o. female with history of ITP, hypothyroidism, esophageal stenosis status postdilatation, and GERD who presents for follow-up of ***.   She was initially seen in 04/2020 for evaluation of a several year history of intermittent chest discomfort described as gas, though at times felt like a "boot is pressing on her chest where there is a knife stabbing her in the shoulder."  Symptoms worsened following COVID illness.  Echo in 05/2020 showed an EF of 55 to 60%, no regional wall motion abnormalities, normal LV diastolic function parameters, normal RV systolic function and ventricular cavity size, no significant valvular abnormalities, and an estimated right atrial pressure of 3 mmHg.  ETT in 05/2020 demonstrated fair exercise capacity with normal heart rate and blood pressure responses, and was overall low risk.  She was seen in the office in 01/2022 reporting a 2-week history of thoracic midline back discomfort that initially began while sitting in an uncomfortable airplane seat while traveling towards the [place] for vacation.  In this context she also has developed right sided neck discomfort and right upper extremity discomfort that is worse with range of motion and did briefly improve with prednisone taper.  She has also noted shortness of breath and substernal chest pressure that felt like "a boot pushing on her chest."  She had been evaluated at a local urgent care for her midline back discomfort and was prescribed prednisone as outlined above with brief improvement.  She was subsequently evaluated by orthopedics with recommendation to follow-up with cardiology.  At the time of her cardiology appointment, she  noted an improvement in her right upper extremity discomfort with range of motion.  Stat D-dimer and high-sensitivity troponin were negative.  Chest x-ray showed minimal subsegmental atelectasis in the right lower lung field, otherwise was nonacute.  Coronary CTA on 01/28/2022 showed a calcium score of 0 with no evidence of CAD.  Noncardiac over read was notable for a small hiatal hernia.  Echo on 02/18/2022 showed an EF of 55 to 60%, no regional wall motion abnormalities, grade 1 diastolic dysfunction, normal RV systolic function and ventricular cavity size, no significant valvular abnormalities, and an estimated right atrial pressure of 3 mmHg.  She was last seen in the office in 03/2022 continuing to note randomly occurring bilateral/mid shoulder and back discomfort as well as occasional chest pressure.  However, symptoms had improved when compared to prior visit.  She continued to note intermittent randomly occurring episodes of shortness of breath, as well as issues with dysphagia.  Given reassuring cardiac workup, no further cardiac testing was indicated at that time and it was recommended she follow-up with PCP for back discomfort.  She was referred to GI for dysphagia with consideration for referral to pulmonology for dyspnea.  She underwent EGD on 06/21/2022 which showed benign esophageal stenosis status post dilatation as well as a hiatal hernia, and gastric polyps.  Biopsy of the distal esophagus showed benign esophageal mucosa.  ***   Labs independently reviewed: 03/2022 - TSH normal, free T4 normal, TC 188, TG 127, HDL 63, LDL 103, BUN 12, serum creatinine 0.82, potassium 4.0, albumin 4.4, AST/ALT normal, Hgb 13.2, PLT 352  Past Medical History:  Diagnosis Date   COVID-19 07/2019   Environmental and seasonal allergies    GERD (gastroesophageal reflux disease)    Headache    sinus   History of abnormal mammogram    seen by Dr. Evette Cristal   Hypothyroidism    ITP (idiopathic thrombocytopenic  purpura) 1990   no issues in several years   Low back pain    Tubular adenoma of colon 2016   Urinary incontinence    Wears contact lenses     Past Surgical History:  Procedure Laterality Date   APPENDECTOMY  2009   BREAST CYST ASPIRATION Left 2019   CHOLECYSTECTOMY  2009   COLONOSCOPY WITH PROPOFOL N/A 02/26/2015   Procedure: COLONOSCOPY WITH PROPOFOL;  Surgeon: Kieth Brightly, MD;  Location: [facility] END[facility]COPY;  Service: Endoscopy;  Laterality: N/A;   COLONOSCOPY WITH PROPOFOL N/A 06/13/2020   Procedure: COLONOSCOPY WITH PROPOFOL;  Surgeon: Earline Mayotte, MD;  Location: [facility] ENDOSCOPY;  Service: Endoscopy;  Laterality: N/A;   ESOPHAGOGASTRODUODENOSCOPY (EGD) WITH PROPOFOL N/A 06/21/2022   Procedure: ESOPHAGOGASTRODUODENOSCOPY (EGD) WITH PROPOFOL;  Surgeon: Midge Minium, MD;  Location: [facility];  Service: Endoscopy;  Laterality: N/A;   NASAL TURBINATE REDUCTION Bilateral 05/02/2015   Procedure: TURBINATE REDUCTION/SUBMUCOSAL RESECTION;  Surgeon: Linus Salmons, MD;  Location: [facility];  Service: ENT;  Laterality: Bilateral;   SEPTOPLASTY N/A 05/02/2015   Procedure: SEPTOPLASTY;  Surgeon: Linus Salmons, MD;  Location: [facility];  Service: ENT;  Laterality: N/A;   TONSILLECTOMY  1975   TOTAL ABDOMINAL HYSTERECTOMY  2003   partial/only ovaries remain    Current Medications: No outpatient medications have been marked as taking for the 08/27/22 encounter (Appointment) with Sondra Barges, PA-C.    Allergies:   Erythromycin, Aspirin, and Ciprofloxacin   Social History   Socioeconomic History   Marital status: Married    Spouse name: Not on file   Number of children: Not on file   Years of education: Not on file   Highest education level: Not on file  Occupational History   Not on file  Tobacco Use   Smoking status: Never   Smokeless tobacco: Never  Vaping Use   Vaping Use: Never used  Substance and Sexual Activity   Alcohol use: No    Drug use: No   Sexual activity: Yes  Other Topics Concern   Not on file  Social History Narrative   Not on file   Social Determinants of Health   Financial Resource Strain: Not on file  Food Insecurity: Not on file  Transportation Needs: Not on file  Physical Activity: Not on file  Stress: Not on file  Social Connections: Not on file     Family History:  The patient's family history includes Breast cancer in her maternal aunt; Breast cancer (age of onset: 67) in her mother; Cancer in her father and maternal aunt; Cancer (age of onset: 75) in her mother; Cervical cancer in her maternal grandmother; Colon cancer (age of onset: 34) in her father; Lung cancer in her maternal grandfather; Ovarian cancer in her maternal aunt.  ROS:   12-point review of systems is negative unless otherwise noted in the HPI.   EKGs/Labs/Other Studies Reviewed:    Studies reviewed were summarized above. The additional studies were reviewed today:  2D echo 05/07/2020: 1. Left ventricular ejection fraction, by estimation, is 55 to 60%. The  left ventricle has normal function. The left ventricle has no regional  wall motion abnormalities. Left ventricular diastolic parameters  were  normal. The average left ventricular  global longitudinal strain is -15.7 %. The global longitudinal strain is  abnormal.   2. Right ventricular systolic function is normal. The right ventricular  size is normal.   3. The mitral valve is normal in structure. No evidence of mitral valve  regurgitation. No evidence of mitral stenosis.   4. The aortic valve is normal in structure. Aortic valve regurgitation is  not visualized. No aortic stenosis is present.   5. The inferior vena cava is normal in size with greater than 50%  respiratory variability, suggesting right atrial pressure of 3 mmHg. __________   ETT 05/16/2020: Baseline EKG demonstrates normal sinus rhythm with poor R wave progression. The patient demonstrates  fair exercise capacity. She exhibited normal heart rate and blood pressure responses. No angina was reported. There were no significant ST segment or T wave changes, though baseline artifact limits evaluation. No significant arrhythmia wwas observed. Low risk exercise tolerance test (Duke Treadmill Score = +6).   Low risk exercise tolerance test without ischemic changes at workload achieved. __________   Coronary CTA 01/28/2022: FINDINGS: Aorta:  Normal size.  No calcifications.  No dissection.   Aortic Valve:  Trileaflet.  No calcifications.   Coronary Arteries:  Normal coronary origin.  Right dominance.   RCA is a dominant artery that gives rise to PDA and PLA. There is no plaque.   Left main gives rise to LAD and LCX arteries.  LM has no disease.   LAD has no plaque.   LCX is a non-dominant artery that gives rise to an OM1 branch. There is no plaque.   Other findings:   Normal pulmonary vein drainage into the left atrium.   Normal left atrial appendage without a thrombus.   Normal size of the pulmonary artery.   IMPRESSION: 1. Coronary calcium score of 0. Patient is low risk for coronary events. 2. Normal coronary origin with right dominance. 3. No evidence of CAD. 4. CAD-RADS 0. Consider non-atherosclerotic causes of chest pain. __________   2D echo 02/18/2022: 1. Left ventricular ejection fraction, by estimation, is 55 to 60%. The  left ventricle has normal function. The left ventricle has no regional  wall motion abnormalities. Left ventricular diastolic parameters are  consistent with Grade I diastolic  dysfunction (impaired relaxation). The average left ventricular global  longitudinal strain is -17.6 %.   2. Right ventricular systolic function is normal. The right ventricular  size is normal.   3. The mitral valve is normal in structure. No evidence of mitral valve  regurgitation. No evidence of mitral stenosis.   4. The aortic valve has an indeterminant  number of cusps. Aortic valve  regurgitation is not visualized. No aortic stenosis is present.   5. The inferior vena cava is normal in size with greater than 50%  respiratory variability, suggesting right atrial pressure of 3 mmHg.    EKG:  EKG is ordered today.  The EKG ordered today demonstrates ***  Recent Labs: 03/22/2022: ALT 13; BUN 12; Creatinine, Ser 0.82; Hemoglobin 13.2; Platelets 352; Potassium 4.0; Sodium 141; TSH 3.680  Recent Lipid Panel    Component Value Date/Time   CHOL 188 03/22/2022 0923   TRIG 127 03/22/2022 0923   HDL 63 03/22/2022 0923   CHOLHDL 3.0 03/22/2022 0923   LDLCALC 103 (H) 03/22/2022 0923    PHYSICAL EXAM:    VS:  There were no vitals taken for this visit.  BMI: There is no height or weight  on file to calculate BMI.  Physical Exam  Wt Readings from Last 3 Encounters:  06/21/22 164 lb (74.4 kg)  03/22/22 167 lb 3.2 oz (75.8 kg)  03/05/22 165 lb 2 oz (74.9 kg)     ASSESSMENT & PLAN:   History of chest pain and dyspnea:   Thoracic midline back pain/right shoulder and upper extremity pain:   Dysphagia with esophageal stenosis: Status post esophageal dilatation.   {Are you ordering a CV Procedure (e.g. stress test, cath, DCCV, TEE, etc)?   Press F2        :[id number]}     Disposition: F/u with Dr. Okey Dupre or an APP in ***.   Medication Adjustments/Labs and Tests Ordered: Current medicines are reviewed at length with the patient today.  Concerns regarding medicines are outlined above. Medication changes, Labs and Tests ordered today are summarized above and listed in the Patient Instructions accessible in Encounters.   Signed, Eula Listen, PA-C 08/19/2022 4:27 PM     [facility] [place] [address] [place], [place] [address] [phone]

## 2022-08-25 DIAGNOSIS — M9902 Segmental and somatic dysfunction of thoracic region: Secondary | ICD-10-CM | POA: Diagnosis not present

## 2022-08-25 DIAGNOSIS — R519 Headache, unspecified: Secondary | ICD-10-CM | POA: Diagnosis not present

## 2022-08-25 DIAGNOSIS — M25511 Pain in right shoulder: Secondary | ICD-10-CM | POA: Diagnosis not present

## 2022-08-25 DIAGNOSIS — M25531 Pain in right wrist: Secondary | ICD-10-CM | POA: Diagnosis not present

## 2022-08-25 DIAGNOSIS — M5414 Radiculopathy, thoracic region: Secondary | ICD-10-CM | POA: Diagnosis not present

## 2022-08-25 DIAGNOSIS — M9901 Segmental and somatic dysfunction of cervical region: Secondary | ICD-10-CM | POA: Diagnosis not present

## 2022-08-27 ENCOUNTER — Ambulatory Visit: Payer: BC Managed Care – PPO | Admitting: Physician Assistant

## 2022-09-03 ENCOUNTER — Ambulatory Visit: Payer: BC Managed Care – PPO | Admitting: Internal Medicine

## 2022-09-14 DIAGNOSIS — M9901 Segmental and somatic dysfunction of cervical region: Secondary | ICD-10-CM | POA: Diagnosis not present

## 2022-09-14 DIAGNOSIS — M5414 Radiculopathy, thoracic region: Secondary | ICD-10-CM | POA: Diagnosis not present

## 2022-09-14 DIAGNOSIS — M25531 Pain in right wrist: Secondary | ICD-10-CM | POA: Diagnosis not present

## 2022-09-14 DIAGNOSIS — M9902 Segmental and somatic dysfunction of thoracic region: Secondary | ICD-10-CM | POA: Diagnosis not present

## 2022-09-14 DIAGNOSIS — M25511 Pain in right shoulder: Secondary | ICD-10-CM | POA: Diagnosis not present

## 2022-09-14 DIAGNOSIS — R519 Headache, unspecified: Secondary | ICD-10-CM | POA: Diagnosis not present

## 2022-09-20 ENCOUNTER — Ambulatory Visit: Payer: BC Managed Care – PPO | Admitting: Nurse Practitioner

## 2022-09-20 ENCOUNTER — Encounter: Payer: Self-pay | Admitting: Nurse Practitioner

## 2022-09-20 ENCOUNTER — Telehealth: Payer: Self-pay

## 2022-09-20 VITALS — BP 118/77 | HR 59 | Temp 97.9°F | Wt 165.8 lb

## 2022-09-20 DIAGNOSIS — N952 Postmenopausal atrophic vaginitis: Secondary | ICD-10-CM | POA: Diagnosis not present

## 2022-09-20 DIAGNOSIS — E7800 Pure hypercholesterolemia, unspecified: Secondary | ICD-10-CM

## 2022-09-20 DIAGNOSIS — N393 Stress incontinence (female) (male): Secondary | ICD-10-CM

## 2022-09-20 DIAGNOSIS — E039 Hypothyroidism, unspecified: Secondary | ICD-10-CM

## 2022-09-20 MED ORDER — ESTRADIOL 0.1 MG/GM VA CREA: TOPICAL_CREAM | VAGINAL | 1 refills | Status: DC

## 2022-09-20 NOTE — Telephone Encounter (Signed)
Received fax stating that Martha Henry does not offer pelvic floor therapy. Can we see about sending referral else where?

## 2022-09-20 NOTE — Progress Notes (Signed)
BP 118/77   Pulse (!) 59   Temp 97.9 F (36.6 C) (Oral)   Wt 165 lb 12.8 oz (75.2 kg)   SpO2 97%   BMI 30.33 kg/m    Subjective:    Patient ID: Martha Henry, female    DOB: 25-Feb-1966, 57 y.o.   MRN: [id number]  HPI: Martha Henry is a 57 y.o. female  Chief Complaint  Patient presents with   Hypothyroidism   HYPOTHYROIDISM Thyroid control status:controlled Satisfied with current treatment? yes Medication side effects: no Medication compliance: excellent compliance Etiology of hypothyroidism:  Recent dose adjustment:no Fatigue:  has some fatigue in the afternoons at times Cold intolerance: no Heat intolerance: no Weight gain: no Weight loss: no Constipation: no Diarrhea/loose stools: no Palpitations: no Lower extremity edema: no Anxiety/depressed mood: no  Patient states she is having vaginal dryness.  She is sore after having sexual intercourse.  She is still having trouble with incontinence.  She has to wear a pad all the time due to not knowing when she will leak.      Relevant past medical, surgical, family and social history reviewed and updated as indicated. Interim medical history since our last visit reviewed. Allergies and medications reviewed and updated.  Review of Systems  Constitutional:  Positive for fatigue. Negative for unexpected weight change.  Cardiovascular:  Negative for palpitations and leg swelling.  Gastrointestinal:  Negative for constipation and diarrhea.  Endocrine: Negative for cold intolerance and heat intolerance.  Genitourinary:  Positive for vaginal pain.  Psychiatric/Behavioral:  Negative for dysphoric mood. The patient is not nervous/anxious.     Per HPI unless specifically indicated above     Objective:    BP 118/77   Pulse (!) 59   Temp 97.9 F (36.6 C) (Oral)   Wt 165 lb 12.8 oz (75.2 kg)   SpO2 97%   BMI 30.33 kg/m   Wt Readings from Last 3 Encounters:  09/20/22 165 lb 12.8 oz (75.2 kg)   06/21/22 164 lb (74.4 kg)  03/22/22 167 lb 3.2 oz (75.8 kg)    Physical Exam Vitals and nursing note reviewed.  Constitutional:      General: She is not in acute distress.    Appearance: Normal appearance. She is normal weight. She is not ill-appearing, toxic-appearing or diaphoretic.  HENT:     Head: Normocephalic.     Right Ear: External ear normal.     Left Ear: External ear normal.     Nose: Nose normal.     Mouth/Throat:     Mouth: Mucous membranes are moist.     Pharynx: Oropharynx is clear.  Eyes:     General:        Right eye: No discharge.        Left eye: No discharge.     Extraocular Movements: Extraocular movements intact.     Conjunctiva/sclera: Conjunctivae normal.     Pupils: Pupils are equal, round, and reactive to light.  Cardiovascular:     Rate and Rhythm: Normal rate and regular rhythm.     Heart sounds: No murmur heard. Pulmonary:     Effort: Pulmonary effort is normal. No respiratory distress.     Breath sounds: Normal breath sounds. No wheezing or rales.  Musculoskeletal:     Cervical back: Normal range of motion and neck supple.  Skin:    General: Skin is warm and dry.     Capillary Refill: Capillary refill takes less than 2 seconds.  Neurological:  General: No focal deficit present.     Mental Status: She is alert and oriented to person, place, and time. Mental status is at baseline.  Psychiatric:        Mood and Affect: Mood normal.        Behavior: Behavior normal.        Thought Content: Thought content normal.        Judgment: Judgment normal.     Results for orders placed or performed during the hospital encounter of 06/21/22  Surgical pathology  Result Value Ref Range   SURGICAL PATHOLOGY      SURGICAL PATHOLOGY CASE: [id number] PATIENT: Martha Henry Surgical Pathology Report     Specimen Submitted: A. Esophagus, mid; cbx  Clinical History: GERD K21.9, dysphagia R13.10.  Hiatal hernia. Esophageal  stricture      DIAGNOSIS: A. ESOPHAGUS, MID; BIOPSY: - BENIGN ESOPHAGEAL MUCOSA. - NEGATIVE FOR INCREASED INTRAEPITHELIAL EOSINOPHILS. - NEGATIVE FOR INTESTINAL METAPLASIA, DYSPLASIA AND MALIGNANCY.   GROSS DESCRIPTION: A. Labeled: Mid esophagus, cold biopsy Received: Formalin Collection time: 11:54 AM on 06/21/2022 Placed into formalin time: 11:54 AM on 06/21/2022 Tissue fragment(s): 2 Size: Range from 0.3-0.4 cm Description: White soft tissue fragments Entirely submitted in 1 cassette.  RB 06/22/2022  Final Diagnosis performed by Redmond Pulling, MD.   Electronically signed 06/23/2022 11:52:34AM The electronic signature indicates that the named Attending Pathologist has evaluated the specimen Technical component performed at [facility], [address] [place], [place] [address] Lab: [phone] Dir: Jolene Schimke, MD, MMM  Professional component performed at [facility], [facility], [address] [place], [place], [place] [address] Lab: [phone] Dir: Beryle Quant, MD       Assessment & Plan:   Problem List Items Addressed This Visit       Endocrine   Hypothyroidism - Primary     Other   Female stress incontinence   Elevated LDL cholesterol level     Follow up plan: No follow-ups on file.

## 2022-09-20 NOTE — Assessment & Plan Note (Signed)
Labs ordered at visit today.  Will make recommendations based on lab results.   

## 2022-09-20 NOTE — Assessment & Plan Note (Signed)
Chronic.  Using Myrbetric.  Still has to use a pad.  Will send for pelvic floor physical therapy.

## 2022-09-20 NOTE — Assessment & Plan Note (Signed)
Chronic.  Controlled.  Continue with current medication regimen on regimen of Levothyroxine 50mcg and alternating with levothyroxine 75mcg.  Refills sent today.  Labs ordered today.  Return to clinic in 6 months for reevaluation.  Call sooner if concerns arise.   

## 2022-09-21 LAB — COMPREHENSIVE METABOLIC PANEL
ALT: 14 IU/L (ref 0–32)
AST: 23 IU/L (ref 0–40)
Albumin/Globulin Ratio: 1.7 (ref 1.2–2.2)
Albumin: 4.1 g/dL (ref 3.8–4.9)
Alkaline Phosphatase: 100 IU/L (ref 44–121)
BUN/Creatinine Ratio: 13 (ref 9–23)
BUN: 10 mg/dL (ref 6–24)
Bilirubin Total: <0.2 mg/dL (ref 0.0–1.2)
CO2: 21 mmol/L (ref 20–29)
Calcium: 9.4 mg/dL (ref 8.7–10.2)
Chloride: 102 mmol/L (ref 96–106)
Creatinine, Ser: 0.78 mg/dL (ref 0.57–1.00)
Globulin, Total: 2.4 g/dL (ref 1.5–4.5)
Glucose: 81 mg/dL (ref 70–99)
Potassium: 4.2 mmol/L (ref 3.5–5.2)
Sodium: 139 mmol/L (ref 134–144)
Total Protein: 6.5 g/dL (ref 6.0–8.5)
eGFR: 90 mL/min/{1.73_m2} (ref >59)

## 2022-09-21 LAB — LIPID PANEL
Chol/HDL Ratio: 3 ratio (ref 0.0–4.4)
Cholesterol, Total: 185 mg/dL (ref 100–199)
HDL: 62 mg/dL (ref >39)
LDL Chol Calc (NIH): 92 mg/dL (ref 0–99)
Triglycerides: 181 mg/dL — ABNORMAL HIGH (ref 0–149)
VLDL Cholesterol Cal: 31 mg/dL (ref 5–40)

## 2022-09-21 LAB — T4, FREE: Free T4: 1.2 ng/dL (ref 0.82–1.77)

## 2022-09-21 LAB — TSH: TSH: 2.55 u[IU]/mL (ref 0.450–4.500)

## 2022-09-21 NOTE — Progress Notes (Signed)
Hi Veverly. It was nice to see you yesterday.  Your lab work looks good.  No concerns at this time. Continue with your current medication regimen.  Follow up as discussed.  Please let me know if you have any questions.  

## 2022-10-05 DIAGNOSIS — M25511 Pain in right shoulder: Secondary | ICD-10-CM | POA: Diagnosis not present

## 2022-10-05 DIAGNOSIS — M5414 Radiculopathy, thoracic region: Secondary | ICD-10-CM | POA: Diagnosis not present

## 2022-10-05 DIAGNOSIS — R519 Headache, unspecified: Secondary | ICD-10-CM | POA: Diagnosis not present

## 2022-10-05 DIAGNOSIS — M9901 Segmental and somatic dysfunction of cervical region: Secondary | ICD-10-CM | POA: Diagnosis not present

## 2022-10-05 DIAGNOSIS — M25531 Pain in right wrist: Secondary | ICD-10-CM | POA: Diagnosis not present

## 2022-10-05 DIAGNOSIS — M9902 Segmental and somatic dysfunction of thoracic region: Secondary | ICD-10-CM | POA: Diagnosis not present

## 2022-10-24 IMAGING — MG MM DIGITAL SCREENING BILAT W/ TOMO AND CAD
8 series · 8 of 24 positions shown · non-contrast
Comparison: Previous exam(s).

CLINICAL DATA: Screening.

EXAM:
DIGITAL SCREENING BILATERAL MAMMOGRAM WITH TOMOSYNTHESIS AND CAD
TECHNIQUE: Bilateral screening digital craniocaudal and mediolateral oblique
mammograms were obtained. Bilateral screening digital breast
tomosynthesis was performed. The images were evaluated with
computer-aided detection.

[L CC synth-2D]
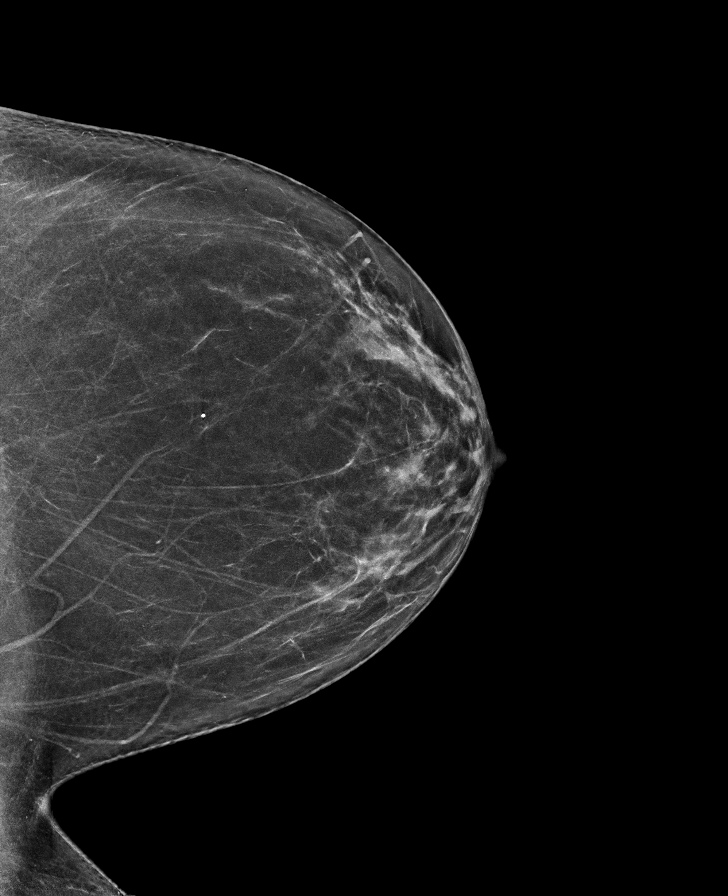

[R CC synth-2D]
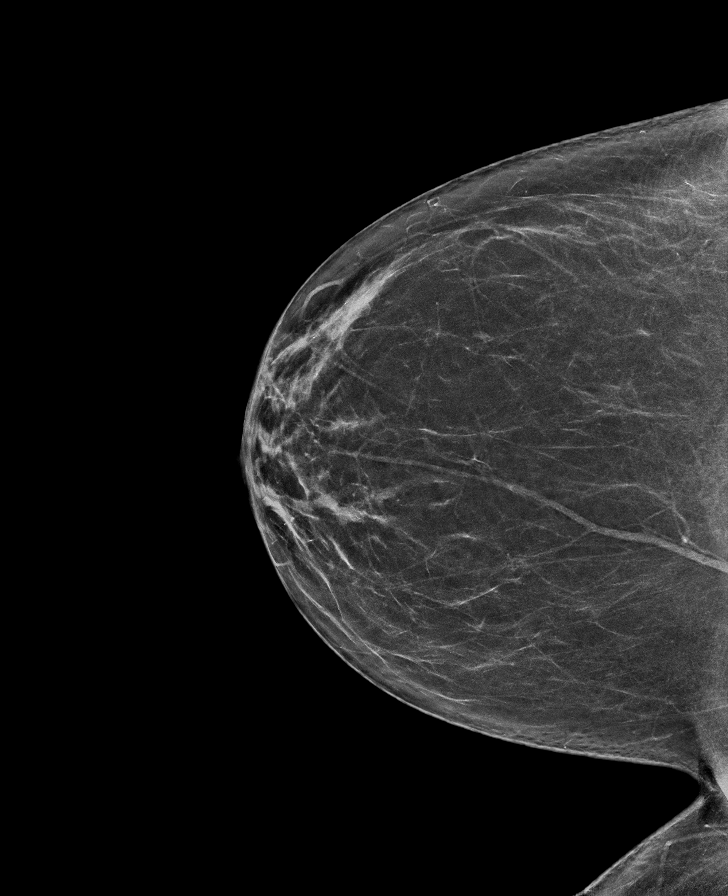

[R MLO synth-2D]
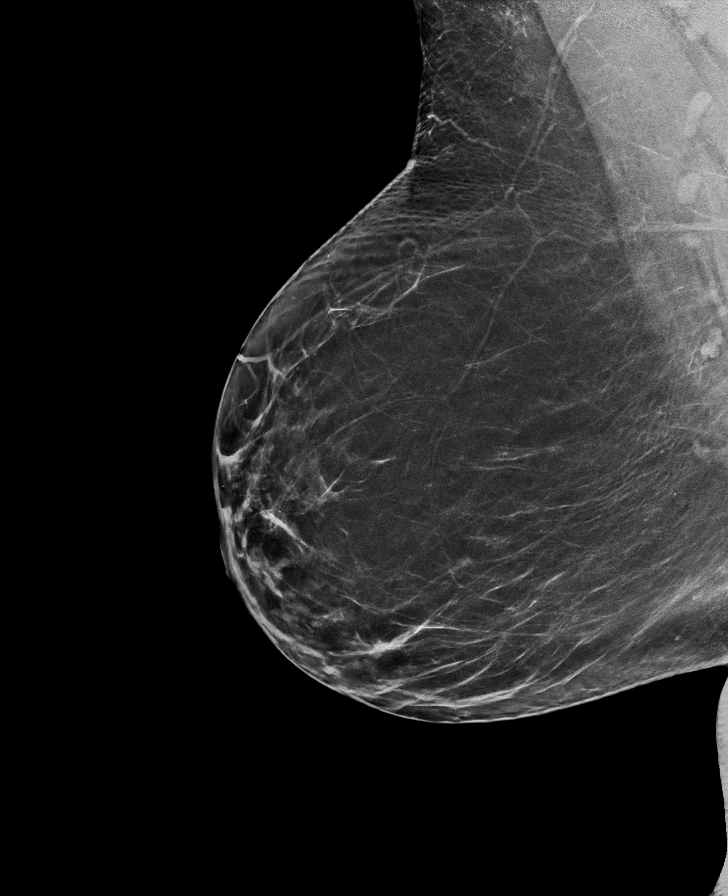

[L MLO synth-2D]
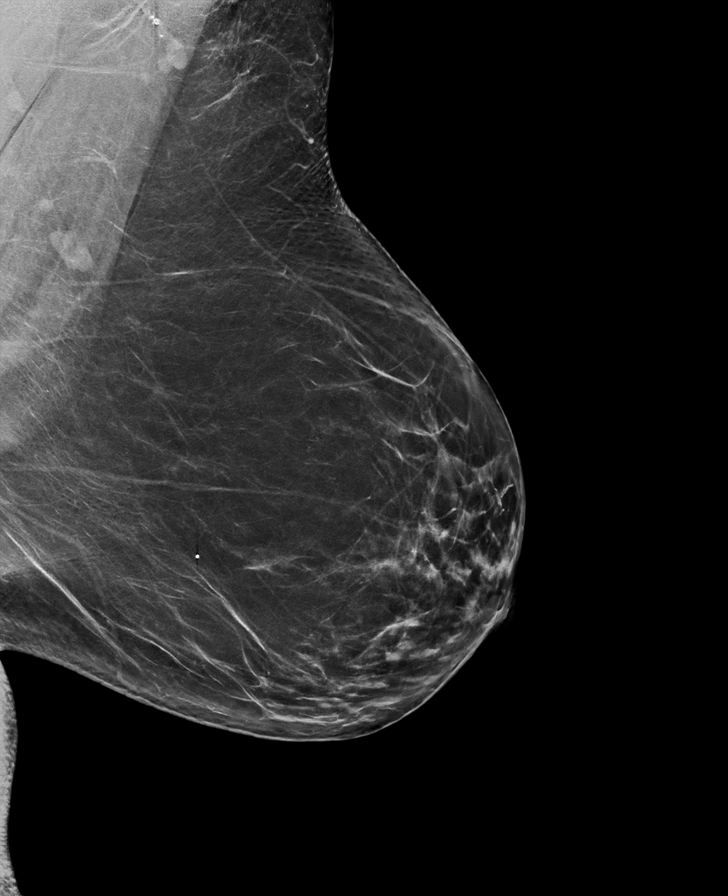

[R CC tomo · tomo slice 33/66.0]
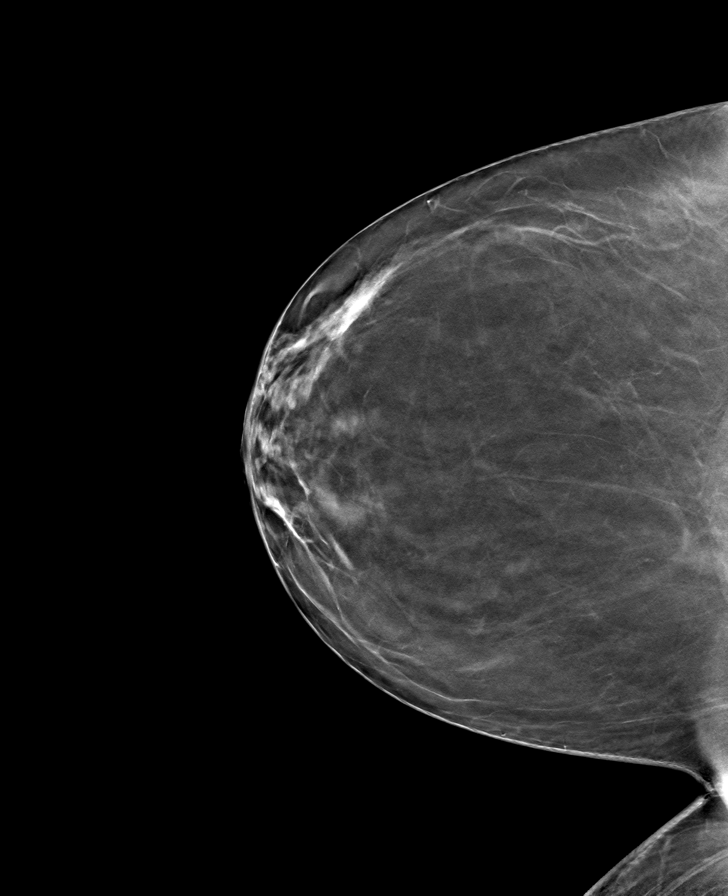

[R MLO tomo · tomo slice 38/75.0]
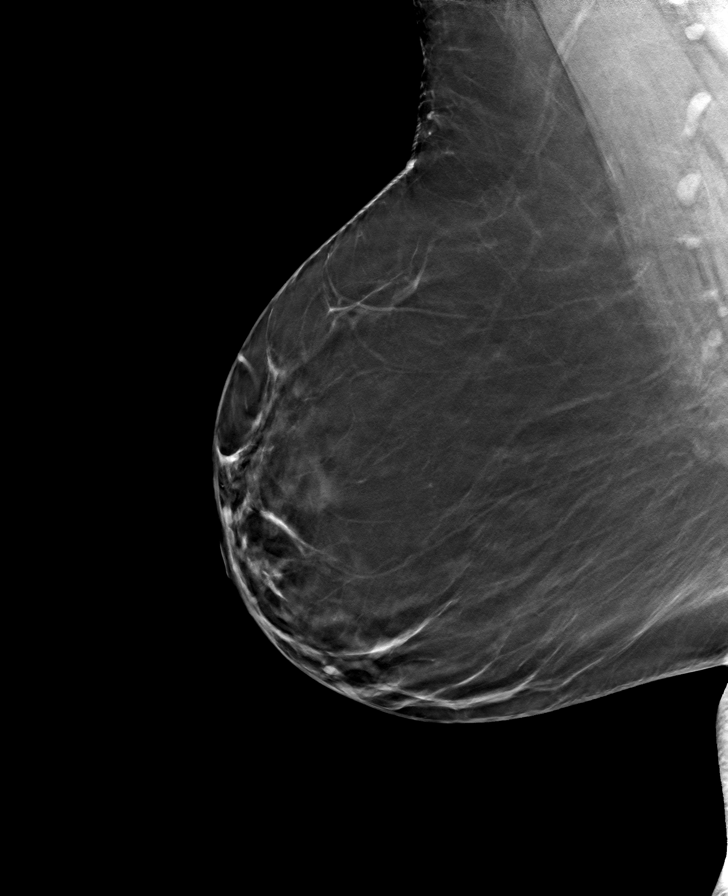

[L CC tomo · tomo slice 33/66.0]
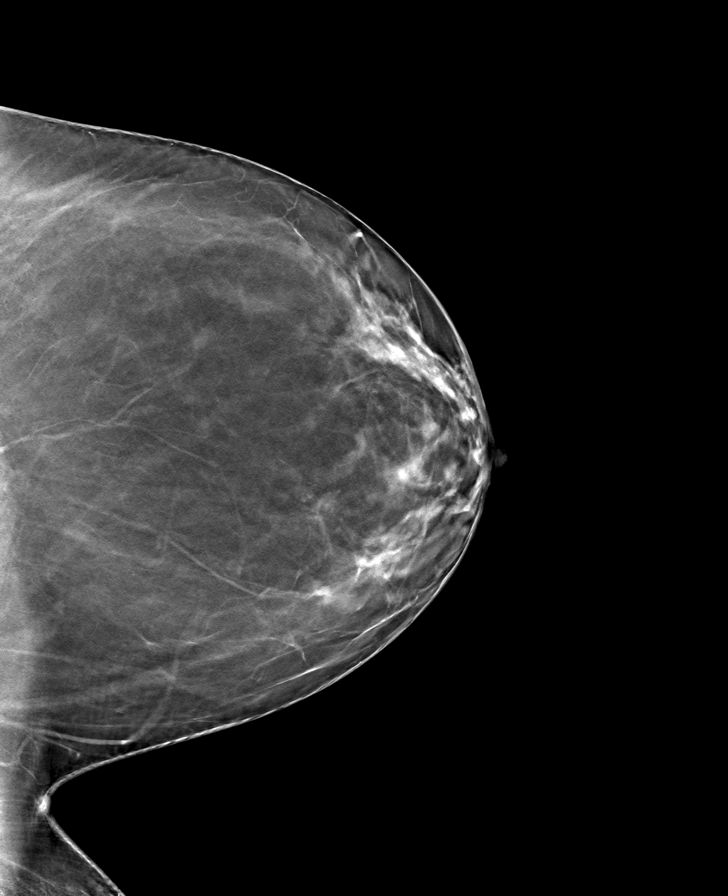

[L MLO tomo · tomo slice 39/78.0]
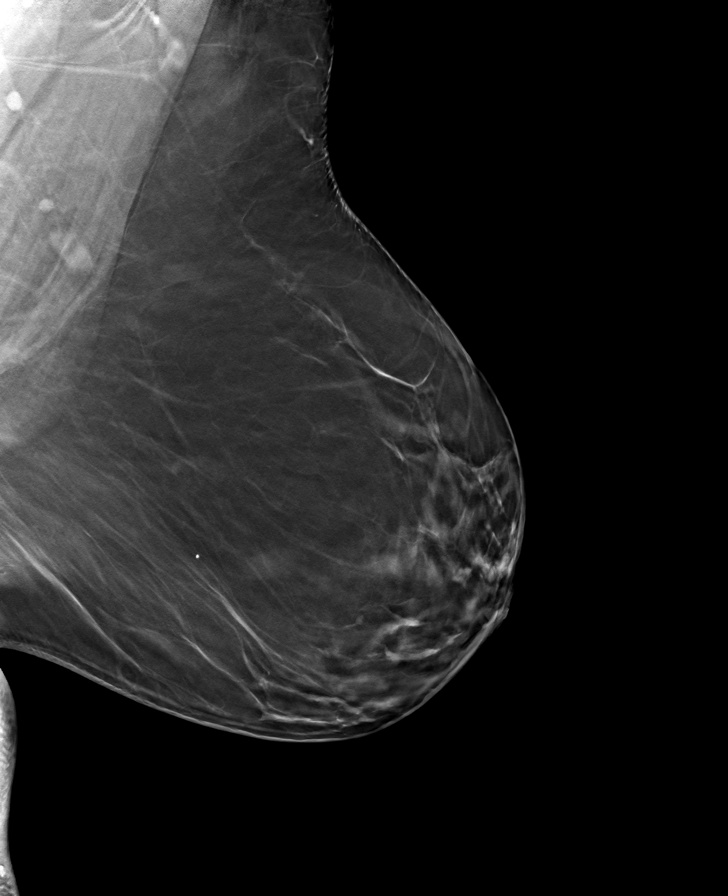

[8 of 24 positions shown; findings below may reference images not displayed]

ACR Breast Density Category b: There are scattered areas of
fibroglandular density.
FINDINGS: There are no findings suspicious for malignancy.
IMPRESSION: No mammographic evidence of malignancy. A result letter of this
screening mammogram will be mailed directly to the patient.

RECOMMENDATION:
Screening mammogram in one year. (Code:51-O-LD2)

BI-RADS CATEGORY  1: Negative.

## 2022-10-27 DIAGNOSIS — R519 Headache, unspecified: Secondary | ICD-10-CM | POA: Diagnosis not present

## 2022-10-27 DIAGNOSIS — M25511 Pain in right shoulder: Secondary | ICD-10-CM | POA: Diagnosis not present

## 2022-10-27 DIAGNOSIS — M9901 Segmental and somatic dysfunction of cervical region: Secondary | ICD-10-CM | POA: Diagnosis not present

## 2022-10-27 DIAGNOSIS — M5414 Radiculopathy, thoracic region: Secondary | ICD-10-CM | POA: Diagnosis not present

## 2022-10-27 DIAGNOSIS — M9902 Segmental and somatic dysfunction of thoracic region: Secondary | ICD-10-CM | POA: Diagnosis not present

## 2022-10-27 DIAGNOSIS — M25531 Pain in right wrist: Secondary | ICD-10-CM | POA: Diagnosis not present

## 2022-11-08 ENCOUNTER — Ambulatory Visit: Payer: BC Managed Care – PPO | Admitting: Urology

## 2022-11-08 ENCOUNTER — Other Ambulatory Visit: Payer: Self-pay | Admitting: Nurse Practitioner

## 2022-11-09 NOTE — Telephone Encounter (Signed)
Requested Prescriptions  Pending Prescriptions Disp Refills   montelukast (SINGULAIR) 10 MG tablet [Pharmacy Med Name: Montelukast Sodium 10 MG Oral Tablet] 90 tablet 0    Sig: TAKE 1 TABLET BY MOUTH AT BEDTIME     Pulmonology:  Leukotriene Inhibitors Passed - 11/08/2022  8:44 PM      Passed - Valid encounter within last 12 months    Recent Outpatient Visits           1 month ago Hypothyroidism, unspecified type   [place] [facility] Larae Grooms, NP   7 months ago Annual physical exam   [place] [facility] Larae Grooms, NP   9 months ago SOB (shortness of breath) on exertion   [employer] [facility] Mecum, Oswaldo Conroy, PA-C   1 year ago Hypothyroidism, unspecified type   [place] [facility] Larae Grooms, NP   1 year ago Nausea   [place] [facility] Larae Grooms, NP       Future Appointments             In 1 month MacDiarmid, Lorin Picket, MD [facility] [place]   In 4 months Larae Grooms, NP [place] [facility]

## 2022-11-19 DIAGNOSIS — M9901 Segmental and somatic dysfunction of cervical region: Secondary | ICD-10-CM | POA: Diagnosis not present

## 2022-11-19 DIAGNOSIS — R519 Headache, unspecified: Secondary | ICD-10-CM | POA: Diagnosis not present

## 2022-11-19 DIAGNOSIS — M25511 Pain in right shoulder: Secondary | ICD-10-CM | POA: Diagnosis not present

## 2022-11-19 DIAGNOSIS — M25531 Pain in right wrist: Secondary | ICD-10-CM | POA: Diagnosis not present

## 2022-11-19 DIAGNOSIS — M5414 Radiculopathy, thoracic region: Secondary | ICD-10-CM | POA: Diagnosis not present

## 2022-11-19 DIAGNOSIS — M9902 Segmental and somatic dysfunction of thoracic region: Secondary | ICD-10-CM | POA: Diagnosis not present

## 2022-11-22 DIAGNOSIS — M9901 Segmental and somatic dysfunction of cervical region: Secondary | ICD-10-CM | POA: Diagnosis not present

## 2022-11-22 DIAGNOSIS — M5414 Radiculopathy, thoracic region: Secondary | ICD-10-CM | POA: Diagnosis not present

## 2022-11-22 DIAGNOSIS — M25511 Pain in right shoulder: Secondary | ICD-10-CM | POA: Diagnosis not present

## 2022-11-22 DIAGNOSIS — R519 Headache, unspecified: Secondary | ICD-10-CM | POA: Diagnosis not present

## 2022-11-22 DIAGNOSIS — M25531 Pain in right wrist: Secondary | ICD-10-CM | POA: Diagnosis not present

## 2022-11-22 DIAGNOSIS — M9902 Segmental and somatic dysfunction of thoracic region: Secondary | ICD-10-CM | POA: Diagnosis not present

## 2022-11-26 DIAGNOSIS — M25531 Pain in right wrist: Secondary | ICD-10-CM | POA: Diagnosis not present

## 2022-11-26 DIAGNOSIS — R519 Headache, unspecified: Secondary | ICD-10-CM | POA: Diagnosis not present

## 2022-11-26 DIAGNOSIS — M25511 Pain in right shoulder: Secondary | ICD-10-CM | POA: Diagnosis not present

## 2022-11-26 DIAGNOSIS — M5414 Radiculopathy, thoracic region: Secondary | ICD-10-CM | POA: Diagnosis not present

## 2022-11-26 DIAGNOSIS — M9902 Segmental and somatic dysfunction of thoracic region: Secondary | ICD-10-CM | POA: Diagnosis not present

## 2022-11-26 DIAGNOSIS — M9901 Segmental and somatic dysfunction of cervical region: Secondary | ICD-10-CM | POA: Diagnosis not present

## 2022-12-09 ENCOUNTER — Telehealth: Payer: Self-pay | Admitting: Urology

## 2022-12-09 DIAGNOSIS — N3946 Mixed incontinence: Secondary | ICD-10-CM

## 2022-12-09 MED ORDER — MIRABEGRON ER 50 MG PO TB24
50.0000 mg | ORAL_TABLET | Freq: Every day | ORAL | 3 refills | Status: DC
Start: 2022-12-09 — End: 2023-02-07

## 2022-12-09 NOTE — Telephone Encounter (Signed)
rx sent to pharmacy by e-script  

## 2022-12-09 NOTE — Telephone Encounter (Signed)
Pt needs a refill of Myrbetriq 50 mg sent to [facility] on [address]

## 2022-12-15 DIAGNOSIS — M5414 Radiculopathy, thoracic region: Secondary | ICD-10-CM | POA: Diagnosis not present

## 2022-12-15 DIAGNOSIS — M9902 Segmental and somatic dysfunction of thoracic region: Secondary | ICD-10-CM | POA: Diagnosis not present

## 2022-12-15 DIAGNOSIS — R519 Headache, unspecified: Secondary | ICD-10-CM | POA: Diagnosis not present

## 2022-12-15 DIAGNOSIS — M25531 Pain in right wrist: Secondary | ICD-10-CM | POA: Diagnosis not present

## 2022-12-15 DIAGNOSIS — M25511 Pain in right shoulder: Secondary | ICD-10-CM | POA: Diagnosis not present

## 2022-12-15 DIAGNOSIS — M9901 Segmental and somatic dysfunction of cervical region: Secondary | ICD-10-CM | POA: Diagnosis not present

## 2022-12-20 ENCOUNTER — Ambulatory Visit: Payer: BC Managed Care – PPO | Admitting: Urology

## 2023-01-05 DIAGNOSIS — M9902 Segmental and somatic dysfunction of thoracic region: Secondary | ICD-10-CM | POA: Diagnosis not present

## 2023-01-05 DIAGNOSIS — M25531 Pain in right wrist: Secondary | ICD-10-CM | POA: Diagnosis not present

## 2023-01-05 DIAGNOSIS — R519 Headache, unspecified: Secondary | ICD-10-CM | POA: Diagnosis not present

## 2023-01-05 DIAGNOSIS — M9901 Segmental and somatic dysfunction of cervical region: Secondary | ICD-10-CM | POA: Diagnosis not present

## 2023-01-05 DIAGNOSIS — M5414 Radiculopathy, thoracic region: Secondary | ICD-10-CM | POA: Diagnosis not present

## 2023-01-05 DIAGNOSIS — M25511 Pain in right shoulder: Secondary | ICD-10-CM | POA: Diagnosis not present

## 2023-01-26 DIAGNOSIS — R519 Headache, unspecified: Secondary | ICD-10-CM | POA: Diagnosis not present

## 2023-01-26 DIAGNOSIS — M25511 Pain in right shoulder: Secondary | ICD-10-CM | POA: Diagnosis not present

## 2023-01-26 DIAGNOSIS — M9901 Segmental and somatic dysfunction of cervical region: Secondary | ICD-10-CM | POA: Diagnosis not present

## 2023-01-26 DIAGNOSIS — M9902 Segmental and somatic dysfunction of thoracic region: Secondary | ICD-10-CM | POA: Diagnosis not present

## 2023-01-26 DIAGNOSIS — M25531 Pain in right wrist: Secondary | ICD-10-CM | POA: Diagnosis not present

## 2023-01-26 DIAGNOSIS — M5414 Radiculopathy, thoracic region: Secondary | ICD-10-CM | POA: Diagnosis not present

## 2023-02-01 ENCOUNTER — Other Ambulatory Visit: Payer: Self-pay | Admitting: Nurse Practitioner

## 2023-02-01 NOTE — Telephone Encounter (Signed)
Requested Prescriptions  Pending Prescriptions Disp Refills   lansoprazole (PREVACID) 30 MG capsule [Pharmacy Med Name: Lansoprazole 30 MG Oral Capsule Delayed Release] 90 capsule 0    Sig: Take 1 capsule by mouth once daily     Gastroenterology: Proton Pump Inhibitors 2 Passed - 02/01/2023  8:14 AM      Passed - ALT in normal range and within 360 days    ALT  Date Value Ref Range Status  09/20/2022 14 0 - 32 IU/L Final         Passed - AST in normal range and within 360 days    AST  Date Value Ref Range Status  09/20/2022 23 0 - 40 IU/L Final         Passed - Valid encounter within last 12 months    Recent Outpatient Visits           4 months ago Hypothyroidism, unspecified type   [place] [facility] Larae Grooms, NP   10 months ago Annual physical exam   [place] [facility] Larae Grooms, NP   1 year ago SOB (shortness of breath) on exertion   [place] [facility] Mecum, Oswaldo Conroy, PA-C   1 year ago Hypothyroidism, unspecified type   [place] [facility] Larae Grooms, NP   1 year ago Nausea   [place] [facility] Larae Grooms, NP       Future Appointments             In 6 days MacDiarmid, Lorin Picket, MD [facility] [place]   In 1 month Larae Grooms, NP  [facility]

## 2023-02-07 ENCOUNTER — Ambulatory Visit (INDEPENDENT_AMBULATORY_CARE_PROVIDER_SITE_OTHER): Payer: BC Managed Care – PPO | Admitting: Urology

## 2023-02-07 ENCOUNTER — Encounter: Payer: Self-pay | Admitting: Urology

## 2023-02-07 VITALS — BP 128/74 | HR 73 | Ht 62.0 in | Wt 164.4 lb

## 2023-02-07 DIAGNOSIS — N3946 Mixed incontinence: Secondary | ICD-10-CM | POA: Diagnosis not present

## 2023-02-07 MED ORDER — MIRABEGRON ER 50 MG PO TB24
50.0000 mg | ORAL_TABLET | Freq: Every day | ORAL | 3 refills | Status: AC
Start: 2023-02-07 — End: ?

## 2023-02-07 NOTE — Progress Notes (Signed)
02/07/2023 9:17 AM   Martha Henry Nov 22, 1965 [id number]  Referring provider: Larae Grooms, NP [address] [place],  [place] [address]  Chief Complaint  Patient presents with   Follow-up   Urinary Incontinence    1 year follow-up    HPI: I was consulted to assist the patient is urinary incontinence.  She has had this for many years and saw another urologist Dr. Achilles Dunk.  He has her on imipramine that may or may not be helping.   She can leak with coughing sneezing not bending lifting.  She has no urge incontinence.  She has no bedwetting.  When she finished the shower she can has some urgency and dribble a small amount of urine.  She wears 2 or 3 liners per day that are damp.   She voids every 1 hour and cannot hold it for 2 hours.  Gets up once or twice at night   She was having some pressure in the lower abdomen but this settled down and she describes her recent negative urinalysis   Mild grade 2 hypermobility the bladder neck and no stress incontinence.  Tissue is a bit elastic.  Asymptomatic distal grade 1 rectocele   Patient saw Dr. Achilles Dunk for years with mild stress incontinence which she currently has.  She does have I believe perhaps some urgency associated leakage in the shower.  She does have a frequent bladder voiding every 1 hour with mild nocturia.  The role of trying some other medication change urodynamics discussed.     I was not surprised that the patient's not sure if she wants to proceed with urodynamics but at the same time does not want to live with the problem.  She took my advice and I will see her back in 6 weeks on Myrbetriq samples and prescription.  We can always order the test pending results and goals.   Mild stress incontinence a bit better Much less frequency and not leaking all the time small amounts she is very pleased.    Urge incontinence much better.  Still leaks with cough or sneeze but intermittent.  Were not can pursue other therapies.   90x3 sent to pharmacy.  See in a year.  Clinically not infected   takes a little bit more time to void.  No infections.  No leaking in shower.  Incontinence much better.  Sent Myrbetriq 50 mg 90x3 to pharmacy. Consult physical therapy. I think there is an excellent choice.   Today Frequency stable.  No infections.  Minimal incontinence stable and very pleased on Myrbetriq.  Never saw physical therapy   Today Urge incontinence much better.  Frequency improved.  Still has stress incontinence when she chokes or coughs hard.  We talked about surgery and physical therapy.  No infections     PMH: Past Medical History:  Diagnosis Date   COVID-19 07/2019   Environmental and seasonal allergies    GERD (gastroesophageal reflux disease)    Headache    sinus   History of abnormal mammogram    seen by Dr. Evette Cristal   Hypothyroidism    ITP (idiopathic thrombocytopenic purpura) 1990   no issues in several years   Low back pain    Tubular adenoma of colon 2016   Urinary incontinence    Wears contact lenses     Surgical History: Past Surgical History:  Procedure Laterality Date   APPENDECTOMY  2009   BREAST CYST ASPIRATION Left 2019   CHOLECYSTECTOMY  2009  COLONOSCOPY WITH PROPOFOL N/A 02/26/2015   Procedure: COLONOSCOPY WITH PROPOFOL;  Surgeon: Kieth Brightly, MD;  Location: [facility];  Service: Endoscopy;  Laterality: N/A;   COLONOSCOPY WITH PROPOFOL N/A 06/13/2020   Procedure: COLONOSCOPY WITH PROPOFOL;  Surgeon: Earline Mayotte, MD;  Location: [facility];  Service: Endoscopy;  Laterality: N/A;   ESOPHAGOGASTRODUODENOSCOPY (EGD) WITH PROPOFOL N/A 06/21/2022   Procedure: ESOPHAGOGASTRODUODENOSCOPY (EGD) WITH PROPOFOL;  Surgeon: Midge Minium, MD;  Location: [facility];  Service: Endoscopy;  Laterality: N/A;   NASAL TURBINATE REDUCTION Bilateral 05/02/2015   Procedure: TURBINATE REDUCTION/SUBMUCOSAL RESECTION;  Surgeon: Linus Salmons, MD;  Location: [facility];  Service: ENT;  Laterality: Bilateral;   SEPTOPLASTY N/A 05/02/2015   Procedure: SEPTOPLASTY;  Surgeon: Linus Salmons, MD;  Location: [facility];  Service: ENT;  Laterality: N/A;   TONSILLECTOMY  1975   TOTAL ABDOMINAL HYSTERECTOMY  2003   partial/only ovaries remain    Home Medications:  Allergies as of 02/07/2023       Reactions   Erythromycin Other (See Comments)   Chest pain as a child   Aspirin Other (See Comments)   Platelet problems   Ciprofloxacin Other (See Comments)   constipation        Medication List        Accurate as of February 07, 2023  9:17 AM. If you have any questions, ask your nurse or doctor.          Calcium-Vitamin D-Vitamin K 500-500-40 MG-UNT-MCG Chew Chew by mouth.   cetirizine 10 MG tablet Commonly known as: ZYRTEC Take 10 mg by mouth daily.   estradiol 0.1 MG/GM vaginal cream Commonly known as: ESTRACE Place 1 applicator full vaginally daily at bedtime x 2 weeks and then reduce to one applicator vaginally at bedtime twice a week only.   fluticasone 50 MCG/ACT nasal spray Commonly known as: FLONASE USE 2 SPRAY(S) IN EACH NOSTRIL TWICE DAILY   lansoprazole 30 MG capsule Commonly known as: PREVACID Take 1 capsule by mouth once daily   levothyroxine 50 MCG tablet Commonly known as: SYNTHROID Take 1 tablet (50 mcg total) by mouth daily.   levothyroxine 75 MCG tablet Commonly known as: SYNTHROID Take 1 tablet (75 mcg total) by mouth daily before breakfast.   mirabegron ER 50 MG Tb24 tablet Commonly known as: MYRBETRIQ Take 1 tablet (50 mg total) by mouth daily.   montelukast 10 MG tablet Commonly known as: SINGULAIR TAKE 1 TABLET BY MOUTH AT BEDTIME   multivitamin with minerals tablet Take by mouth.   Turmeric Curcumin 500 MG Caps Take by mouth daily.   VITAMIN B-12 PO Take by mouth daily.   Vitamin D3 50 MCG (2000 UT) Chew Chew by mouth.        Allergies:  Allergies  Allergen Reactions    Erythromycin Other (See Comments)    Chest pain as a child    Aspirin Other (See Comments)    Platelet problems   Ciprofloxacin Other (See Comments)    constipation    Family History: Family History  Problem Relation Age of Onset   Cancer Mother 37       breast   Breast cancer Mother 10   Colon cancer Father 46   Cancer Father        colon   Breast cancer Maternal Aunt    Cancer Maternal Aunt        breast   Ovarian cancer Maternal Aunt        Stage  4   Lung cancer Maternal Grandfather    Cervical cancer Maternal Grandmother     Social History:  reports that she has never smoked. She has never been exposed to tobacco smoke. She has never used smokeless tobacco. She reports that she does not drink alcohol and does not use drugs.  ROS:                                        Physical Exam: There were no vitals taken for this visit.  Constitutional:  Alert and oriented, No acute distress. HEENT: Colon AT, moist mucus membranes.  Trachea midline, no masses.  Laboratory Data: Lab Results  Component Value Date   WBC 10.0 03/22/2022   HGB 13.2 03/22/2022   HCT 41.6 03/22/2022   MCV 83 03/22/2022   PLT 352 03/22/2022    Lab Results  Component Value Date   CREATININE 0.78 09/20/2022    No results found for: "PSA"  No results found for: "TESTOSTERONE"  No results found for: "HGBA1C"  Urinalysis    Component Value Date/Time   APPEARANCEUR Clear 03/22/2022 0921   GLUCOSEU Negative 03/22/2022 0921   BILIRUBINUR Negative 03/22/2022 0921   PROTEINUR Negative 03/22/2022 0921   NITRITE Negative 03/22/2022 0921   LEUKOCYTESUR Negative 03/22/2022 0921    Pertinent Imaging:   Assessment & Plan: 90 x 3 sent to pharmacy and I will see in a year  1. Mixed incontinence  - Urinalysis, Complete   No follow-ups on file.  Martina Sinner, MD  [facility] [address], [address] [place], [place] [address] [phone]

## 2023-02-08 LAB — MICROSCOPIC EXAMINATION: Epithelial Cells (non renal): >10 /[HPF] — AB (ref 0–10)

## 2023-02-08 LAB — URINALYSIS, COMPLETE
Bilirubin, UA: NEGATIVE
Glucose, UA: NEGATIVE
Ketones, UA: NEGATIVE
Leukocytes,UA: NEGATIVE
Nitrite, UA: NEGATIVE
Protein,UA: NEGATIVE
Specific Gravity, UA: 1.01 (ref 1.005–1.030)
Urobilinogen, Ur: 0.2 mg/dL (ref 0.2–1.0)
pH, UA: 5.5 (ref 5.0–7.5)

## 2023-02-11 DIAGNOSIS — R519 Headache, unspecified: Secondary | ICD-10-CM | POA: Diagnosis not present

## 2023-02-11 DIAGNOSIS — M9901 Segmental and somatic dysfunction of cervical region: Secondary | ICD-10-CM | POA: Diagnosis not present

## 2023-02-11 DIAGNOSIS — M9902 Segmental and somatic dysfunction of thoracic region: Secondary | ICD-10-CM | POA: Diagnosis not present

## 2023-02-11 DIAGNOSIS — M5414 Radiculopathy, thoracic region: Secondary | ICD-10-CM | POA: Diagnosis not present

## 2023-02-11 DIAGNOSIS — M25531 Pain in right wrist: Secondary | ICD-10-CM | POA: Diagnosis not present

## 2023-02-11 DIAGNOSIS — M25511 Pain in right shoulder: Secondary | ICD-10-CM | POA: Diagnosis not present

## 2023-02-18 ENCOUNTER — Other Ambulatory Visit: Payer: Self-pay | Admitting: Physician Assistant

## 2023-02-18 NOTE — Telephone Encounter (Signed)
Requested Prescriptions  Pending Prescriptions Disp Refills   montelukast (SINGULAIR) 10 MG tablet [Pharmacy Med Name: Montelukast Sodium 10 MG Oral Tablet] 90 tablet 0    Sig: TAKE 1 TABLET BY MOUTH AT BEDTIME     Pulmonology:  Leukotriene Inhibitors Passed - 02/18/2023  6:33 AM      Passed - Valid encounter within last 12 months    Recent Outpatient Visits           5 months ago Hypothyroidism, unspecified type   [place] [facility] Larae Grooms, NP   11 months ago Annual physical exam   [place] [facility] Larae Grooms, NP   1 year ago SOB (shortness of breath) on exertion   [place] [facility] Mecum, Oswaldo Conroy, PA-C   1 year ago Hypothyroidism, unspecified type   [place] [facility] Larae Grooms, NP   1 year ago Nausea   [place] [facility] Larae Grooms, NP       Future Appointments             In 1 month Larae Grooms, NP [facility]   In 11 months MacDiarmid, Lorin Picket, MD [facility]

## 2023-03-02 DIAGNOSIS — M5414 Radiculopathy, thoracic region: Secondary | ICD-10-CM | POA: Diagnosis not present

## 2023-03-02 DIAGNOSIS — M25511 Pain in right shoulder: Secondary | ICD-10-CM | POA: Diagnosis not present

## 2023-03-02 DIAGNOSIS — M9902 Segmental and somatic dysfunction of thoracic region: Secondary | ICD-10-CM | POA: Diagnosis not present

## 2023-03-02 DIAGNOSIS — R519 Headache, unspecified: Secondary | ICD-10-CM | POA: Diagnosis not present

## 2023-03-02 DIAGNOSIS — M25531 Pain in right wrist: Secondary | ICD-10-CM | POA: Diagnosis not present

## 2023-03-02 DIAGNOSIS — M9901 Segmental and somatic dysfunction of cervical region: Secondary | ICD-10-CM | POA: Diagnosis not present

## 2023-03-15 ENCOUNTER — Ambulatory Visit: Payer: BC Managed Care – PPO | Admitting: Family Medicine

## 2023-03-15 VITALS — BP 126/85 | HR 76 | Temp 97.6°F | Ht 61.81 in | Wt 163.8 lb

## 2023-03-15 DIAGNOSIS — J0111 Acute recurrent frontal sinusitis: Secondary | ICD-10-CM

## 2023-03-15 DIAGNOSIS — N951 Menopausal and female climacteric states: Secondary | ICD-10-CM

## 2023-03-15 MED ORDER — AMOXICILLIN-POT CLAVULANATE 875-125 MG PO TABS: 1.0000 | ORAL_TABLET | Freq: Two times a day (BID) | ORAL | 0 refills | Status: AC

## 2023-03-15 NOTE — Assessment & Plan Note (Signed)
Acute, ongoing. Recommend otc Estroven for symptom relief.

## 2023-03-15 NOTE — Assessment & Plan Note (Addendum)
Acute, ongoing. Will treat with Augmentin BID for 7 days. Recommend: - Increased rest - Increasing Fluids - Humidifying the air -Flonase daily -Zyrtec daily  Return to office for worsening or ongoing symptoms.

## 2023-03-15 NOTE — Progress Notes (Unsigned)
BP 126/85   Pulse 76   Temp 97.6 F (36.4 C) (Oral)   Ht 5' 1.81" (1.57 m)   Wt 163 lb 12.8 oz (74.3 kg)   SpO2 96%   BMI 30.14 kg/m    Subjective:    Patient ID: Martha Henry, female    DOB: 12/14/1965, 57 y.o.   MRN: [id number]  HPI: Martha Henry is a 57 y.o. female  Chief Complaint  Patient presents with   Sinus Problem   Hot Flashes   She feels her hot flashes are worsening, started 3 weeks ago. Hot flashes occur at any time of the day and multiple times throughout the day, and has occurred daily. She has not tried anything for the hot flashes. She admits to recent stressors in her life.   UPPER RESPIRATORY TRACT INFECTION She has been dealing with sinus issues for the past 6 weeks. She feels her symptoms are worsening.  Worst symptom:sinus pressure and headache Fever: no Cough: no Shortness of breath: no Wheezing: no Chest pain: no Chest tightness: no Chest congestion: no Nasal congestion: yes Runny nose: yes Post nasal drip: yes Sneezing: yes Sore throat: no Swollen glands: no Sinus pressure: yes Headache: yes has constant headache:  Face pain: yes Toothache: no Ear pain: No Ear pressure: yes right ear Eyes red/itching:no Eye drainage/crusting: no  Vomiting: no Rash: no Fatigue: yes sleep has been disturbed, hard time staying asleep.  Sick contacts: no Strep contacts: no  Context: worse Recurrent sinusitis: yes Relief with OTC cold/cough medications: yes  Treatments attempted:  Sudafed and tylenol     Relevant past medical, surgical, family and social history reviewed and updated as indicated. Interim medical history since our last visit reviewed. Allergies and medications reviewed and updated.  Review of Systems  Constitutional:  Positive for fatigue. Negative for chills and fever.  HENT:  Positive for congestion, ear pain, postnasal drip, rhinorrhea, sinus pressure, sinus pain and sneezing. Negative for sore throat.   Eyes:   Negative for discharge, redness and itching.  Respiratory:  Negative for cough, chest tightness, shortness of breath and wheezing.   Cardiovascular:  Negative for chest pain.  Gastrointestinal:  Negative for vomiting.  Skin:  Negative for rash.  Neurological:  Positive for headaches.    Per HPI unless specifically indicated above     Objective:    BP 126/85   Pulse 76   Temp 97.6 F (36.4 C) (Oral)   Ht 5' 1.81" (1.57 m)   Wt 163 lb 12.8 oz (74.3 kg)   SpO2 96%   BMI 30.14 kg/m   Wt Readings from Last 3 Encounters:  03/15/23 163 lb 12.8 oz (74.3 kg)  02/07/23 164 lb 6 oz (74.6 kg)  09/20/22 165 lb 12.8 oz (75.2 kg)    Physical Exam Vitals and nursing note reviewed.  Constitutional:      General: She is not in acute distress.    Appearance: Normal appearance. She is not ill-appearing, toxic-appearing or diaphoretic.  HENT:     Head: Normocephalic and atraumatic.     Right Ear: Tympanic membrane, ear canal and external ear normal. There is no impacted cerumen. Tympanic membrane is not erythematous.     Left Ear: Tympanic membrane, ear canal and external ear normal. There is no impacted cerumen. Tympanic membrane is not erythematous.     Nose: Congestion and rhinorrhea present.     Right Turbinates: Swollen and pale.     Left Turbinates: Swollen and pale.  Right Sinus: Maxillary sinus tenderness and frontal sinus tenderness present.     Left Sinus: Maxillary sinus tenderness and frontal sinus tenderness present.     Mouth/Throat:     Mouth: Mucous membranes are moist.     Pharynx: Oropharynx is clear. Postnasal drip present. No oropharyngeal exudate or posterior oropharyngeal erythema.  Eyes:     General: No scleral icterus.       Right eye: No discharge.        Left eye: No discharge.     Extraocular Movements: Extraocular movements intact.     Conjunctiva/sclera: Conjunctivae normal.     Pupils: Pupils are equal, round, and reactive to light.  Neck:     Vascular:  No carotid bruit.  Cardiovascular:     Rate and Rhythm: Normal rate and regular rhythm.     Pulses: Normal pulses.          Radial pulses are 2+ on the right side and 2+ on the left side.     Heart sounds: Normal heart sounds. No murmur heard.    No friction rub. No gallop.  Pulmonary:     Effort: Pulmonary effort is normal. No respiratory distress.     Breath sounds: Normal breath sounds. No stridor or decreased air movement. No decreased breath sounds, wheezing, rhonchi or rales.  Chest:     Chest wall: No tenderness.  Musculoskeletal:        General: Normal range of motion.     Cervical back: Normal range of motion and neck supple. No rigidity. No muscular tenderness.  Lymphadenopathy:     Cervical: No cervical adenopathy.  Skin:    General: Skin is warm and dry.     Capillary Refill: Capillary refill takes less than 2 seconds.     Coloration: Skin is not jaundiced or pale.     Findings: No bruising, erythema, lesion or rash.  Neurological:     General: No focal deficit present.     Mental Status: She is alert and oriented to person, place, and time. Mental status is at baseline.     Cranial Nerves: No cranial nerve deficit.     Sensory: No sensory deficit.     Motor: No weakness.     Coordination: Coordination normal.     Gait: Gait normal.     Deep Tendon Reflexes: Reflexes normal.  Psychiatric:        Mood and Affect: Mood normal.        Behavior: Behavior normal.        Thought Content: Thought content normal.        Judgment: Judgment normal.     Results for orders placed or performed in visit on 02/07/23  Microscopic Examination   Urine  Result Value Ref Range   WBC, UA 0-5 0 - 5 /hpf   RBC, Urine 0-2 0 - 2 /hpf   Epithelial Cells (non renal) >10 (A) 0 - 10 /hpf   Crystals Present (A) N/A   Crystal Type Amorphous Sediment N/A   Bacteria, UA Few None seen/Few  Urinalysis, Complete  Result Value Ref Range   Specific Gravity, UA 1.010 1.005 - 1.030   pH, UA  5.5 5.0 - 7.5   Color, UA Yellow Yellow   Appearance Ur Clear Clear   Leukocytes,UA Negative Negative   Protein,UA Negative Negative/Trace   Glucose, UA Negative Negative   Ketones, UA Negative Negative   RBC, UA 1+ (A) Negative   Bilirubin, UA Negative Negative  Urobilinogen, Ur 0.2 0.2 - 1.0 mg/dL   Nitrite, UA Negative Negative   Microscopic Examination See below:       Assessment & Plan:   Problem List Items Addressed This Visit     Hot flashes, menopausal    Acute, ongoing. Recommend otc Estroven for symptom relief.       Acute frontal sinusitis - Primary    Acute, ongoing. Will treat with Augmentin BID for 7 days. Recommend: - Increased rest - Increasing Fluids - Humidifying the air -Flonase daily -Zyrtec daily  Return to office for worsening or ongoing symptoms.      Relevant Medications   amoxicillin-clavulanate (AUGMENTIN) 875-125 MG tablet     Follow up plan: Return if symptoms worsen or fail to improve.

## 2023-03-15 NOTE — Patient Instructions (Addendum)
Options for managing hot flashes include; Wearing layers of clothing, so you can easily take off layers to cool down  Avoiding spicy food, alcohol, caffeine, and hot drinks  Doing relaxation exercises like yoga- 150 mins/week  Sitting still in a cold room when possible

## 2023-03-23 ENCOUNTER — Ambulatory Visit: Payer: BC Managed Care – PPO | Admitting: Nurse Practitioner

## 2023-03-23 DIAGNOSIS — M25511 Pain in right shoulder: Secondary | ICD-10-CM | POA: Diagnosis not present

## 2023-03-23 DIAGNOSIS — M9902 Segmental and somatic dysfunction of thoracic region: Secondary | ICD-10-CM | POA: Diagnosis not present

## 2023-03-23 DIAGNOSIS — R519 Headache, unspecified: Secondary | ICD-10-CM | POA: Diagnosis not present

## 2023-03-23 DIAGNOSIS — M5414 Radiculopathy, thoracic region: Secondary | ICD-10-CM | POA: Diagnosis not present

## 2023-03-23 DIAGNOSIS — M9901 Segmental and somatic dysfunction of cervical region: Secondary | ICD-10-CM | POA: Diagnosis not present

## 2023-03-23 DIAGNOSIS — M25531 Pain in right wrist: Secondary | ICD-10-CM | POA: Diagnosis not present

## 2023-03-23 NOTE — Progress Notes (Unsigned)
There were no vitals taken for this visit.   Subjective:    Patient ID: Martha Henry, female    DOB: 04-Dec-1965, 57 y.o.   MRN: [id number]  HPI: Martha Henry is a 57 y.o. female presenting on 03/24/2023 for comprehensive medical examination. Current medical complaints include: GI Symptoms  She currently lives with: Menopausal Symptoms: no  Patient states she isn't having any heart burn but she is waking up in the middle of the night a couple of times a week coughing and choking and feeling like she has to throw up.  States she will have issues where she needs to go to the bathroom.  Feels like she has a stomach bug but it then it goes away the next day.    HYPOTHYROIDISM Thyroid control status:controlled Satisfied with current treatment? yes Medication side effects: no Medication compliance: excellent compliance Etiology of hypothyroidism:  Recent dose adjustment:no Fatigue:some Cold intolerance: no Heat intolerance: no Weight gain: no Weight loss: no Constipation: no Diarrhea/loose stools: yes Palpitations: no Lower extremity edema: no Anxiety/depressed mood: no   Depression Screen done today and results listed below:     09/20/2022   10:07 AM 03/22/2022    9:05 AM 01/26/2022    8:58 AM 09/17/2021    9:36 AM 06/03/2021    3:30 PM  Depression screen PHQ 2/9  Decreased Interest 0 0 0 0 1  Down, Depressed, Hopeless 0 0 0 0 0  PHQ - 2 Score 0 0 0 0 1  Altered sleeping 2 1 0 0 0  Tired, decreased energy 1 1 0 1 1  Change in appetite 0 0 0 0 1  Feeling bad or failure about yourself  0 0 0 0 0  Trouble concentrating 0 0 0 0 0  Moving slowly or fidgety/restless 0 0 0 0 0  Suicidal thoughts 0 0 0 0 0  PHQ-9 Score 3 2 0 1 3  Difficult doing work/chores Not difficult at all Not difficult at all Not difficult at all Not difficult at all     The patient does not have a history of falls. I did complete a risk assessment for falls. A plan of care for falls was  documented.   Past Medical History:  Past Medical History:  Diagnosis Date  . COVID-19 07/2019  . Environmental and seasonal allergies   . GERD (gastroesophageal reflux disease)   . Headache    sinus  . History of abnormal mammogram    seen by Dr. Evette Cristal  . Hypothyroidism   . ITP (idiopathic thrombocytopenic purpura) 1990   no issues in several years  . Low back pain   . Tubular adenoma of colon 2016  . Urinary incontinence   . Wears contact lenses     Surgical History:  Past Surgical History:  Procedure Laterality Date  . APPENDECTOMY  2009  . BREAST CYST ASPIRATION Left 2019  . CHOLECYSTECTOMY  2009  . COLONOSCOPY WITH PROPOFOL N/A 02/26/2015   Procedure: COLONOSCOPY WITH PROPOFOL;  Surgeon: Kieth Brightly, MD;  Location: [facility] END[facility];  Service: Endoscopy;  Laterality: N/A;  . COLONOSCOPY WITH PROPOFOL N/A 06/13/2020   Procedure: COLONOSCOPY WITH PROPOFOL;  Surgeon: Earline Mayotte, MD;  Location: [facility]COPY;  Service: Endoscopy;  Laterality: N/A;  . ESOPHAGOGASTRODUODENOSCOPY (EGD) WITH PROPOFOL N/A 06/21/2022   Procedure: ESOPHAGOGASTRODUODENOSCOPY (EGD) WITH PROPOFOL;  Surgeon: Midge Minium, MD;  Location: [facility];  Service: Endoscopy;  Laterality: N/A;  . NASAL TURBINATE REDUCTION Bilateral  05/02/2015   Procedure: TURBINATE REDUCTION/SUBMUCOSAL RESECTION;  Surgeon: Linus Salmons, MD;  Location: [facility];  Service: ENT;  Laterality: Bilateral;  . SEPTOPLASTY N/A 05/02/2015   Procedure: SEPTOPLASTY;  Surgeon: Linus Salmons, MD;  Location[facility];  Service: ENT;  Laterality: N/A;  . TONSILLECTOMY  1975  . TOTAL ABDOMINAL HYSTERECTOMY  2003   partial/only ovaries remain    Medications:  Current Outpatient Medications on File Prior to Visit  Medication Sig  . Calcium-Vitamin D-Vitamin K 500-500-40 MG-UNT-MCG CHEW Chew by mouth.  . cetirizine (ZYRTEC) 10 MG tablet Take 10 mg by mouth daily.  . Cholecalciferol  (VITAMIN D3) 50 MCG (2000 UT) CHEW Chew by mouth.  . Cyanocobalamin (VITAMIN B-12 PO) Take by mouth daily.  Marland Kitchen estradiol (ESTRACE) 0.1 MG/GM vaginal cream Place 1 applicator full vaginally daily at bedtime x 2 weeks and then reduce to one applicator vaginally at bedtime twice a week only.  . fluticasone (FLONASE) 50 MCG/ACT nasal spray USE 2 SPRAY(S) IN EACH NOSTRIL TWICE DAILY  . lansoprazole (PREVACID) 30 MG capsule Take 1 capsule by mouth once daily  . levothyroxine (SYNTHROID) 50 MCG tablet Take 1 tablet (50 mcg total) by mouth daily.  Marland Kitchen levothyroxine (SYNTHROID) 75 MCG tablet Take 1 tablet (75 mcg total) by mouth daily before breakfast.  . mirabegron ER (MYRBETRIQ) 50 MG TB24 tablet Take 1 tablet (50 mg total) by mouth daily.  . montelukast (SINGULAIR) 10 MG tablet TAKE 1 TABLET BY MOUTH AT BEDTIME  . Multiple Vitamins-Minerals (MULTIVITAMIN WITH MINERALS) tablet Take by mouth.  . Turmeric Curcumin 500 MG CAPS Take by mouth daily.    No current facility-administered medications on file prior to visit.    Allergies:  Allergies  Allergen Reactions  . Erythromycin Other (See Comments)    Chest pain as a child   . Aspirin Other (See Comments)    Platelet problems  . Ciprofloxacin Other (See Comments)    constipation    Social History:  Social History   Socioeconomic History  . Marital status: Married    Spouse name: Not on file  . Number of children: Not on file  . Years of education: Not on file  . Highest education level: Associate degree: occupational, Scientist, product/process development, or vocational program  Occupational History  . Not on file  Tobacco Use  . Smoking status: Never    Passive exposure: Never  . Smokeless tobacco: Never  Vaping Use  . Vaping status: Never Used  Substance and Sexual Activity  . Alcohol use: No  . Drug use: No  . Sexual activity: Yes  Other Topics Concern  . Not on file  Social History Narrative  . Not on file   Social Determinants of Health    Financial Resource Strain: Low Risk  (09/16/2022)   Overall Financial Resource Strain (CARDIA)   . Difficulty of Paying Living Expenses: Not hard at all  Food Insecurity: No Food Insecurity (09/16/2022)   Hunger Vital Sign   . Worried About Programme researcher, broadcasting/film/video in the Last Year: Never true   . Ran Out of Food in the Last Year: Never true  Transportation Needs: No Transportation Needs (09/16/2022)   PRAPARE - Transportation   . Lack of Transportation (Medical): No   . Lack of Transportation (Non-Medical): No  Physical Activity: Insufficiently Active (09/16/2022)   Exercise Vital Sign   . Days of Exercise per Week: 2 days   . Minutes of Exercise per Session: 20 min  Stress: Stress  Concern Present (09/16/2022)   [employer] of Occupational Health - Occupational Stress Questionnaire   . Feeling of Stress : To some extent  Social Connections: Socially Integrated (09/16/2022)   Social Connection and Isolation Panel [NHANES]   . Frequency of Communication with Friends and Family: More than three times a week   . Frequency of Social Gatherings with Friends and Family: More than three times a week   . Attends Religious Services: More than 4 times per year   . Active Member of Clubs or Organizations: Yes   . Attends Banker Meetings: More than 4 times per year   . Marital Status: Married  Catering manager Violence: Not on file   Social History   Tobacco Use  Smoking Status Never  . Passive exposure: Never  Smokeless Tobacco Never   Social History   Substance and Sexual Activity  Alcohol Use No    Family History:  Family History  Problem Relation Age of Onset  . Cancer Mother 46       breast  . Breast cancer Mother 34  . Colon cancer Father 30  . Cancer Father        colon  . Breast cancer Maternal Aunt   . Cancer Maternal Aunt        breast  . Ovarian cancer Maternal Aunt        Stage 4  . Lung cancer Maternal Grandfather   . Cervical cancer Maternal  Grandmother     Past medical history, surgical history, medications, allergies, family history and social history reviewed with patient today and changes made to appropriate areas of the chart.   Review of Systems  Constitutional:  Negative for malaise/fatigue and weight loss.  Respiratory:  Positive for cough.   Cardiovascular:  Negative for palpitations and leg swelling.  Gastrointestinal:  Negative for constipation and diarrhea.  Psychiatric/Behavioral:  Negative for depression. The patient is not nervous/anxious.    All other ROS negative except what is listed above and in the HPI.      Objective:    There were no vitals taken for this visit.  Wt Readings from Last 3 Encounters:  03/15/23 163 lb 12.8 oz (74.3 kg)  02/07/23 164 lb 6 oz (74.6 kg)  09/20/22 165 lb 12.8 oz (75.2 kg)    Physical Exam Vitals and nursing note reviewed.  Constitutional:      General: She is awake. She is not in acute distress.    Appearance: Normal appearance. She is well-developed. She is not ill-appearing.  HENT:     Head: Normocephalic and atraumatic.     Right Ear: Hearing, tympanic membrane, ear canal and external ear normal. No drainage.     Left Ear: Hearing, tympanic membrane, ear canal and external ear normal. No drainage.     Nose: Nose normal.     Right Sinus: No maxillary sinus tenderness or frontal sinus tenderness.     Left Sinus: No maxillary sinus tenderness or frontal sinus tenderness.     Mouth/Throat:     Mouth: Mucous membranes are moist.     Pharynx: Oropharynx is clear. Uvula midline. No pharyngeal swelling, oropharyngeal exudate or posterior oropharyngeal erythema.  Eyes:     General: Lids are normal.        Right eye: No discharge.        Left eye: No discharge.     Extraocular Movements: Extraocular movements intact.     Conjunctiva/sclera: Conjunctivae normal.  Pupils: Pupils are equal, round, and reactive to light.     Visual Fields: Right eye visual fields  normal and left eye visual fields normal.  Neck:     Thyroid: No thyromegaly.     Vascular: No carotid bruit.     Trachea: Trachea normal.  Cardiovascular:     Rate and Rhythm: Normal rate and regular rhythm.     Heart sounds: Normal heart sounds. No murmur heard.    No gallop.  Pulmonary:     Effort: Pulmonary effort is normal. No accessory muscle usage or respiratory distress.     Breath sounds: Normal breath sounds.  Chest:  Breasts:    Right: Normal.     Left: Normal.  Abdominal:     General: Bowel sounds are normal.     Palpations: Abdomen is soft. There is no hepatomegaly or splenomegaly.     Tenderness: There is no abdominal tenderness.  Musculoskeletal:        General: Normal range of motion.     Cervical back: Normal range of motion and neck supple.     Right lower leg: No edema.     Left lower leg: No edema.  Lymphadenopathy:     Head:     Right side of head: No submental, submandibular, tonsillar, preauricular or posterior auricular adenopathy.     Left side of head: No submental, submandibular, tonsillar, preauricular or posterior auricular adenopathy.     Cervical: No cervical adenopathy.     Upper Body:     Right upper body: No supraclavicular, axillary or pectoral adenopathy.     Left upper body: No supraclavicular, axillary or pectoral adenopathy.  Skin:    General: Skin is warm and dry.     Capillary Refill: Capillary refill takes less than 2 seconds.     Findings: No rash.  Neurological:     Mental Status: She is alert and oriented to person, place, and time.     Gait: Gait is intact.  Psychiatric:        Attention and Perception: Attention normal.        Mood and Affect: Mood normal.        Speech: Speech normal.        Behavior: Behavior normal. Behavior is cooperative.        Thought Content: Thought content normal.        Judgment: Judgment normal.    Results for orders placed or performed in visit on 02/07/23  Microscopic Examination   Urine   Result Value Ref Range   WBC, UA 0-5 0 - 5 /hpf   RBC, Urine 0-2 0 - 2 /hpf   Epithelial Cells (non renal) >10 (A) 0 - 10 /hpf   Crystals Present (A) N/A   Crystal Type Amorphous Sediment N/A   Bacteria, UA Few None seen/Few  Urinalysis, Complete  Result Value Ref Range   Specific Gravity, UA 1.010 1.005 - 1.030   pH, UA 5.5 5.0 - 7.5   Color, UA Yellow Yellow   Appearance Ur Clear Clear   Leukocytes,UA Negative Negative   Protein,UA Negative Negative/Trace   Glucose, UA Negative Negative   Ketones, UA Negative Negative   RBC, UA 1+ (A) Negative   Bilirubin, UA Negative Negative   Urobilinogen, Ur 0.2 0.2 - 1.0 mg/dL   Nitrite, UA Negative Negative   Microscopic Examination See below:       Assessment & Plan:   Problem List Items Addressed This Visit  None     Follow up plan: No follow-ups on file.   LABORATORY TESTING:  - Pap smear: up to date  IMMUNIZATIONS:   - Tdap: Tetanus vaccination status reviewed: last tetanus booster within 10 years. - Influenza: Administered today - Pneumovax: Not applicable - Prevnar: Not applicable - COVID: Not applicable - HPV: Not applicable - Shingrix vaccine:  Discussed at visit today  SCREENING: -Mammogram: Up to date  - Colonoscopy: Up to date  - Bone Density: Not applicable  -Hearing Test: Not applicable  -Spirometry: Not applicable   PATIENT COUNSELING:   Advised to take 1 mg of folate supplement per day if capable of pregnancy.   Sexuality: Discussed sexually transmitted diseases, partner selection, use of condoms, avoidance of unintended pregnancy  and contraceptive alternatives.   Advised to avoid cigarette smoking.  I discussed with the patient that most people either abstain from alcohol or drink within safe limits (<=14/week and <=4 drinks/occasion for males, <=7/weeks and <= 3 drinks/occasion for females) and that the risk for alcohol disorders and other health effects rises proportionally with the number of  drinks per week and how often a drinker exceeds daily limits.  Discussed cessation/primary prevention of drug use and availability of treatment for abuse.   Diet: Encouraged to adjust caloric intake to maintain  or achieve ideal body weight, to reduce intake of dietary saturated fat and total fat, to limit sodium intake by avoiding high sodium foods and not adding table salt, and to maintain adequate dietary potassium and calcium preferably from fresh fruits, vegetables, and low-fat dairy products.    stressed the importance of regular exercise  Injury prevention: Discussed safety belts, safety helmets, smoke detector, smoking near bedding or upholstery.   Dental health: Discussed importance of regular tooth brushing, flossing, and dental visits.    NEXT PREVENTATIVE PHYSICAL DUE IN 1 YEAR. No follow-ups on file.

## 2023-03-24 ENCOUNTER — Ambulatory Visit: Payer: BC Managed Care – PPO | Admitting: Nurse Practitioner

## 2023-03-24 ENCOUNTER — Encounter: Payer: Self-pay | Admitting: Nurse Practitioner

## 2023-03-24 VITALS — BP 125/85 | HR 80 | Temp 98.0°F | Ht 62.0 in | Wt 164.0 lb

## 2023-03-24 DIAGNOSIS — Z0000 Encounter for general adult medical examination without abnormal findings: Secondary | ICD-10-CM | POA: Diagnosis not present

## 2023-03-24 DIAGNOSIS — Z23 Encounter for immunization: Secondary | ICD-10-CM | POA: Diagnosis not present

## 2023-03-24 DIAGNOSIS — E7800 Pure hypercholesterolemia, unspecified: Secondary | ICD-10-CM | POA: Diagnosis not present

## 2023-03-24 DIAGNOSIS — L731 Pseudofolliculitis barbae: Secondary | ICD-10-CM

## 2023-03-24 DIAGNOSIS — E039 Hypothyroidism, unspecified: Secondary | ICD-10-CM | POA: Diagnosis not present

## 2023-03-24 DIAGNOSIS — Z1231 Encounter for screening mammogram for malignant neoplasm of breast: Secondary | ICD-10-CM | POA: Diagnosis not present

## 2023-03-24 LAB — URINALYSIS, ROUTINE W REFLEX MICROSCOPIC
Bilirubin, UA: NEGATIVE
Glucose, UA: NEGATIVE
Ketones, UA: NEGATIVE
Leukocytes,UA: NEGATIVE
Nitrite, UA: NEGATIVE
Protein,UA: NEGATIVE
Specific Gravity, UA: 1.01 (ref 1.005–1.030)
Urobilinogen, Ur: 0.2 mg/dL (ref 0.2–1.0)
pH, UA: 7 (ref 5.0–7.5)

## 2023-03-24 LAB — MICROSCOPIC EXAMINATION: Bacteria, UA: NONE SEEN

## 2023-03-24 MED ORDER — MONTELUKAST SODIUM 10 MG PO TABS: 10.0000 mg | ORAL_TABLET | Freq: Every day | ORAL | 1 refills | Status: DC

## 2023-03-24 MED ORDER — ESTRADIOL 0.1 MG/GM VA CREA: TOPICAL_CREAM | VAGINAL | 1 refills | Status: DC

## 2023-03-24 MED ORDER — SULFAMETHOXAZOLE-TRIMETHOPRIM 800-160 MG PO TABS: 1.0000 | ORAL_TABLET | Freq: Two times a day (BID) | ORAL | 0 refills | Status: DC

## 2023-03-24 NOTE — Assessment & Plan Note (Signed)
Chronic.  Controlled.  Continue with current medication regimen on regimen of Levothyroxine 77mg and alternating with levothyroxine 740m.  Refills sent today.  Labs ordered today.  Return to clinic in 6 months for reevaluation.  Call sooner if concerns arise.

## 2023-03-25 LAB — LIPID PANEL
Chol/HDL Ratio: 2.9 ratio (ref 0.0–4.4)
Cholesterol, Total: 175 mg/dL (ref 100–199)
HDL: 61 mg/dL (ref >39)
LDL Chol Calc (NIH): 86 mg/dL (ref 0–99)
Triglycerides: 168 mg/dL — ABNORMAL HIGH (ref 0–149)
VLDL Cholesterol Cal: 28 mg/dL (ref 5–40)

## 2023-03-25 LAB — COMPREHENSIVE METABOLIC PANEL
ALT: 19 [IU]/L (ref 0–32)
AST: 21 [IU]/L (ref 0–40)
Albumin: 4.2 g/dL (ref 3.8–4.9)
Alkaline Phosphatase: 99 [IU]/L (ref 44–121)
BUN/Creatinine Ratio: 14 (ref 9–23)
BUN: 12 mg/dL (ref 6–24)
Bilirubin Total: 0.3 mg/dL (ref 0.0–1.2)
CO2: 26 mmol/L (ref 20–29)
Calcium: 9.7 mg/dL (ref 8.7–10.2)
Chloride: 103 mmol/L (ref 96–106)
Creatinine, Ser: 0.84 mg/dL (ref 0.57–1.00)
Globulin, Total: 2.2 g/dL (ref 1.5–4.5)
Glucose: 74 mg/dL (ref 70–99)
Potassium: 4.2 mmol/L (ref 3.5–5.2)
Sodium: 141 mmol/L (ref 134–144)
Total Protein: 6.4 g/dL (ref 6.0–8.5)
eGFR: 82 mL/min/{1.73_m2} (ref >59)

## 2023-03-25 LAB — CBC WITH DIFFERENTIAL/PLATELET
Basophils Absolute: 0 10*3/uL (ref 0.0–0.2)
Basos: 1 %
EOS (ABSOLUTE): 0.2 10*3/uL (ref 0.0–0.4)
Eos: 2 %
Hematocrit: 40.6 % (ref 34.0–46.6)
Hemoglobin: 13.2 g/dL (ref 11.1–15.9)
Immature Grans (Abs): 0 10*3/uL (ref 0.0–0.1)
Immature Granulocytes: 0 %
Lymphocytes Absolute: 3.1 10*3/uL (ref 0.7–3.1)
Lymphs: 37 %
MCH: 26.7 pg (ref 26.6–33.0)
MCHC: 32.5 g/dL (ref 31.5–35.7)
MCV: 82 fL (ref 79–97)
Monocytes Absolute: 0.6 10*3/uL (ref 0.1–0.9)
Monocytes: 7 %
Neutrophils Absolute: 4.6 10*3/uL (ref 1.4–7.0)
Neutrophils: 53 %
Platelets: 343 10*3/uL (ref 150–450)
RBC: 4.94 x10E6/uL (ref 3.77–5.28)
RDW: 13 % (ref 11.7–15.4)
WBC: 8.5 10*3/uL (ref 3.4–10.8)

## 2023-03-25 LAB — T4, FREE: Free T4: 1.42 ng/dL (ref 0.82–1.77)

## 2023-03-25 LAB — TSH: TSH: 1.55 u[IU]/mL (ref 0.450–4.500)

## 2023-04-18 DIAGNOSIS — M9902 Segmental and somatic dysfunction of thoracic region: Secondary | ICD-10-CM | POA: Diagnosis not present

## 2023-04-18 DIAGNOSIS — M25511 Pain in right shoulder: Secondary | ICD-10-CM | POA: Diagnosis not present

## 2023-04-18 DIAGNOSIS — M5414 Radiculopathy, thoracic region: Secondary | ICD-10-CM | POA: Diagnosis not present

## 2023-04-18 DIAGNOSIS — R519 Headache, unspecified: Secondary | ICD-10-CM | POA: Diagnosis not present

## 2023-04-18 DIAGNOSIS — M9901 Segmental and somatic dysfunction of cervical region: Secondary | ICD-10-CM | POA: Diagnosis not present

## 2023-04-18 DIAGNOSIS — M25531 Pain in right wrist: Secondary | ICD-10-CM | POA: Diagnosis not present

## 2023-05-10 ENCOUNTER — Ambulatory Visit
Admission: RE | Admit: 2023-05-10 | Discharge: 2023-05-10 | Disposition: A | Discharge status: Home / Self Care | Payer: BC Managed Care – PPO | Source: Ambulatory Visit | Attending: Nurse Practitioner | Admitting: Nurse Practitioner

## 2023-05-10 DIAGNOSIS — Z1231 Encounter for screening mammogram for malignant neoplasm of breast: Secondary | ICD-10-CM | POA: Insufficient documentation

## 2023-05-11 DIAGNOSIS — M25511 Pain in right shoulder: Secondary | ICD-10-CM | POA: Diagnosis not present

## 2023-05-11 DIAGNOSIS — R519 Headache, unspecified: Secondary | ICD-10-CM | POA: Diagnosis not present

## 2023-05-11 DIAGNOSIS — M9902 Segmental and somatic dysfunction of thoracic region: Secondary | ICD-10-CM | POA: Diagnosis not present

## 2023-05-11 DIAGNOSIS — M25531 Pain in right wrist: Secondary | ICD-10-CM | POA: Diagnosis not present

## 2023-05-11 DIAGNOSIS — M5414 Radiculopathy, thoracic region: Secondary | ICD-10-CM | POA: Diagnosis not present

## 2023-05-11 DIAGNOSIS — M9901 Segmental and somatic dysfunction of cervical region: Secondary | ICD-10-CM | POA: Diagnosis not present

## 2023-05-16 ENCOUNTER — Other Ambulatory Visit: Payer: Self-pay | Admitting: Nurse Practitioner

## 2023-05-17 NOTE — Telephone Encounter (Signed)
 Requested Prescriptions  Pending Prescriptions Disp Refills   lansoprazole  (PREVACID ) 30 MG capsule [Pharmacy Med Name: Lansoprazole  30 MG Oral Capsule Delayed Release] 90 capsule 0    Sig: Take 1 capsule by mouth once daily     Gastroenterology: Proton Pump Inhibitors 2 Passed - 05/17/2023  1:24 PM      Passed - ALT in normal range and within 360 days    ALT  Date Value Ref Range Status  03/24/2023 19 0 - 32 IU/L Final         Passed - AST in normal range and within 360 days    AST  Date Value Ref Range Status  03/24/2023 21 0 - 40 IU/L Final         Passed - Valid encounter within last 12 months    Recent Outpatient Visits           1 month ago Annual physical exam   [place] [facility] Melvin Pao, NP   2 months ago Acute recurrent frontal sinusitis   [place] [facility] Pearley, Hyla Givens, NP   7 months ago Hypothyroidism, unspecified type   [place] [facility] Melvin Pao, NP   1 year ago Annual physical exam   [place] [facility] Melvin Pao, NP   1 year ago SOB (shortness of breath) on exertion   [place] [facility] Mecum, Rocky BRAVO, PA-C       Future Appointments             In 4 months Melvin Pao, NP [place] [facility]   In 8 months MacDiarmid, Gl[place]ia, MD [facility] [place]             levothyroxine  (SYNTHROID ) 50 MCG tablet [Pharmacy Med Name: Levothyroxine  Sodium 50 MCG Oral Tablet] 90 tablet 0    Sig: Take 1 tablet by mouth once daily     Endocrinology:  Hypothyroid Agents Passed - 05/17/2023  1:24 PM      Passed - TSH in normal range and within 360 days    TSH  Date Value Ref Range Status  03/24/2023 1.550 0.450 - 4.500 uIU/mL Final         Passed - Valid encounter within last 12 months    Recent Outpatient Visits           1 month ago Annual physical exam   [place] [facility]  Melvin Pao, NP   2 months ago Acute recurrent frontal sinusitis   [place] [facility] Pearley, Hyla Givens, NP   7 months ago Hypothyroidism, unspecified type   [place] [facility] Melvin Pao, NP   1 year ago Annual physical exam   [place] [facility] Melvin Pao, NP   1 year ago SOB (shortness of breath) on exertion   [place] [facility] Mecum, Rocky BRAVO, PA-C       Future Appointments             In 4 months Melvin Pao, NP  [facility]   In 8 months MacDiarmid, Glendia, MD [facility] [place]             levothyroxine  (SYNTHROID ) 75 MCG tablet [Pharmacy Med Name: Levothyroxine  Sodium 75 MCG Oral Tablet] 90 tablet 0    Sig: TAKE 1 TABLET BY MOUTH ONCE DAILY BEFORE BREAKFAST     Endocrinology:  Hypothyroid Agents  Passed - 05/17/2023  1:24 PM      Passed - TSH in normal range and within 360 days    TSH  Date Value Ref Range Status  03/24/2023 1.550 0.450 - 4.500 uIU/mL Final         Passed - Valid encounter within last 12 months    Recent Outpatient Visits           1 month ago Annual physical exam   [facility] Melvin Pao, NP   2 months ago Acute recurrent frontal sinusitis   [place] [facility] Rhame, Hyla Givens, NP   7 months ago Hypothyroidism, unspecified type   [place] [facility] Melvin Pao, NP   1 year ago Annual physical exam   [place] [facility] Melvin Pao, NP   1 year ago SOB (shortness of breath) on exertion   [place] [facility] Mecum, Rocky BRAVO, PA-C       Future Appointments             In 4 months Melvin Pao, NP [place] [facility]   In 8 months MacDiarmid, Glendia, MD [facility]

## 2023-05-19 ENCOUNTER — Ambulatory Visit: Payer: Self-pay

## 2023-05-19 ENCOUNTER — Ambulatory Visit: Payer: BC Managed Care – PPO | Admitting: Nurse Practitioner

## 2023-05-19 VITALS — BP 145/86 | HR 80 | Temp 97.8°F | Ht 62.0 in | Wt 169.4 lb

## 2023-05-19 DIAGNOSIS — R5383 Other fatigue: Secondary | ICD-10-CM | POA: Diagnosis not present

## 2023-05-19 DIAGNOSIS — M79601 Pain in right arm: Secondary | ICD-10-CM | POA: Diagnosis not present

## 2023-05-19 MED ORDER — METHYLPREDNISOLONE 4 MG PO TBPK: ORAL_TABLET | ORAL | 0 refills | Status: DC

## 2023-05-19 NOTE — Progress Notes (Signed)
BP (!) 145/86 (BP Location: Left Arm, Patient Position: Sitting, Cuff Size: Large)   Pulse 80   Temp 97.8 F (36.6 C) (Oral)   Ht 5\' 2"  (1.575 m)   Wt 169 lb 6.4 oz (76.8 kg)   SpO2 98%   BMI 30.98 kg/m    Subjective:    Patient ID: Martha Henry, female    DOB: [birth year], 58 y.o.   MRN: [id number]  HPI: Martha Henry is a 58 y.o. female  Chief Complaint  Patient presents with   Arm Pain    Starts at the shoulder and radiates down to the thumb, difficult to lift things as simple as a coffee cup, pain started Monday, has been intermittent, has been using OTC medications and creams that have been ineffective   Fatigue   ARM PAIN Unable to lay on her right side. Hurts to write.   Duration: days (started on Monday) Location: right Mechanism of injury: unknown Onset: sudden Severity: mild  Quality:   heaviness and a deep pain. Can't grip with her right hand , throbbing pain Frequency: constant Radiation: no Aggravating factors: movement , can't lay on her arm Alleviating factors:  none   Status: worse Treatments attempted:  tylenol and voltaren, rest, and heat  Relief with NSAIDs?:  No NSAIDs Taken Swelling: no Redness: no  Warmth: no Trauma: no Chest pain: no  Shortness of breath: no  Fever: no Decreased sensation: no Paresthesias: yes Weakness: yes  FATIGUE Duration:  months Severity: 8/10  Onset: gradual Context when symptoms started:  unknown Symptoms improve with rest:  sometimes   Depressive symptoms: no Stress/anxiety: yes Insomnia: yes hard to stay asleep Snoring: yes Observed apnea by bed partner: no Daytime hypersomnolence:yes Wakes feeling refreshed: no History of sleep study: no- not interested Dysnea on exertion:  no Orthopnea/PND: no Chest pain: no Chronic cough: no Lower extremity edema: no Arthralgias:yes Myalgias: yes- her hips and legs Weakness: no Rash: no    Relevant past medical, surgical, family and social  history reviewed and updated as indicated. Interim medical history since our last visit reviewed. Allergies and medications reviewed and updated.  Review of Systems  Constitutional:  Positive for fatigue.  Musculoskeletal:  Positive for arthralgias and myalgias.       Right arm pain  Psychiatric/Behavioral:  Negative for dysphoric mood.        Stress    Per HPI unless specifically indicated above     Objective:    BP (!) 145/86 (BP Location: Left Arm, Patient Position: Sitting, Cuff Size: Large)   Pulse 80   Temp 97.8 F (36.6 C) (Oral)   Ht 5\' 2"  (1.575 m)   Wt 169 lb 6.4 oz (76.8 kg)   SpO2 98%   BMI 30.98 kg/m   Wt Readings from Last 3 Encounters:  05/19/23 169 lb 6.4 oz (76.8 kg)  03/24/23 164 lb (74.4 kg)  03/15/23 163 lb 12.8 oz (74.3 kg)    Physical Exam Vitals and nursing note reviewed.  Constitutional:      General: She is not in acute distress.    Appearance: Normal appearance. She is normal weight. She is not ill-appearing, toxic-appearing or diaphoretic.  HENT:     Head: Normocephalic.     Right Ear: External ear normal.     Left Ear: External ear normal.     Nose: Nose normal.     Mouth/Throat:     Mouth: Mucous membranes are moist.     Pharynx:  Oropharynx is clear.  Eyes:     General:        Right eye: No discharge.        Left eye: No discharge.     Extraocular Movements: Extraocular movements intact.     Conjunctiva/sclera: Conjunctivae normal.     Pupils: Pupils are equal, round, and reactive to light.  Cardiovascular:     Rate and Rhythm: Normal rate and regular rhythm.     Heart sounds: No murmur heard. Pulmonary:     Effort: Pulmonary effort is normal. No respiratory distress.     Breath sounds: Normal breath sounds. No wheezing or rales.  Musculoskeletal:     Right shoulder: No swelling, deformity, effusion, laceration, tenderness, bony tenderness or crepitus. Normal range of motion. Decreased strength. Normal pulse.     Cervical back:  Normal range of motion and neck supple.  Skin:    General: Skin is warm and dry.     Capillary Refill: Capillary refill takes less than 2 seconds.  Neurological:     General: No focal deficit present.     Mental Status: She is alert and oriented to person, place, and time. Mental status is at baseline.  Psychiatric:        Mood and Affect: Mood normal.        Behavior: Behavior normal.        Thought Content: Thought content normal.        Judgment: Judgment normal.     Results for orders placed or performed in visit on 03/24/23  Microscopic Examination   Collection Time: 03/24/23  9:47 AM   Urine  Result Value Ref Range   WBC, UA 0-5 0 - 5 /hpf   RBC, Urine 0-2 0 - 2 /hpf   Epithelial Cells (non renal) 0-10 0 - 10 /hpf   Bacteria, UA None seen None seen/Few  Urinalysis, Routine w reflex microscopic   Collection Time: 03/24/23  9:47 AM  Result Value Ref Range   Specific Gravity, UA 1.010 1.005 - 1.030   pH, UA 7.0 5.0 - 7.5   Color, UA Yellow Yellow   Appearance Ur Clear Clear   Leukocytes,UA Negative Negative   Protein,UA Negative Negative/Trace   Glucose, UA Negative Negative   Ketones, UA Negative Negative   RBC, UA Trace (A) Negative   Bilirubin, UA Negative Negative   Urobilinogen, Ur 0.2 0.2 - 1.0 mg/dL   Nitrite, UA Negative Negative   Microscopic Examination See below:   CBC with Differential/Platelet   Collection Time: 03/24/23  9:48 AM  Result Value Ref Range   WBC 8.5 3.4 - 10.8 x10E3/uL   RBC 4.94 3.77 - 5.28 x10E6/uL   Hemoglobin 13.2 11.1 - 15.9 g/dL   Hematocrit 16.1 09.6 - 46.6 %   MCV 82 79 - 97 fL   MCH 26.7 26.6 - 33.0 pg   MCHC 32.5 31.5 - 35.7 g/dL   RDW 04.5 40.9 - 81.1 %   Platelets 343 150 - 450 x10E3/uL   Neutrophils 53 Not Estab. %   Lymphs 37 Not Estab. %   Monocytes 7 Not Estab. %   Eos 2 Not Estab. %   Basos 1 Not Estab. %   Neutrophils Absolute 4.6 1.4 - 7.0 x10E3/uL   Lymphocytes Absolute 3.1 0.7 - 3.1 x10E3/uL   Monocytes  Absolute 0.6 0.1 - 0.9 x10E3/uL   EOS (ABSOLUTE) 0.2 0.0 - 0.4 x10E3/uL   Basophils Absolute 0.0 0.0 - 0.2 x10E3/uL   Immature  Granulocytes 0 Not Estab. %   Immature Grans (Abs) 0.0 0.0 - 0.1 x10E3/uL  Comprehensive metabolic panel   Collection Time: 03/24/23  9:48 AM  Result Value Ref Range   Glucose 74 70 - 99 mg/dL   BUN 12 6 - 24 mg/dL   Creatinine, Ser 1.30 0.57 - 1.00 mg/dL   eGFR 82 >86 VH/QIO/9.62   BUN/Creatinine Ratio 14 9 - 23   Sodium 141 134 - 144 mmol/L   Potassium 4.2 3.5 - 5.2 mmol/L   Chloride 103 96 - 106 mmol/L   CO2 26 20 - 29 mmol/L   Calcium 9.7 8.7 - 10.2 mg/dL   Total Protein 6.4 6.0 - 8.5 g/dL   Albumin 4.2 3.8 - 4.9 g/dL   Globulin, Total 2.2 1.5 - 4.5 g/dL   Bilirubin Total 0.3 0.0 - 1.2 mg/dL   Alkaline Phosphatase 99 44 - 121 IU/L   AST 21 0 - 40 IU/L   ALT 19 0 - 32 IU/L  Lipid panel   Collection Time: 03/24/23  9:48 AM  Result Value Ref Range   Cholesterol, Total 175 100 - 199 mg/dL   Triglycerides 952 (H) 0 - 149 mg/dL   HDL 61 >84 mg/dL   VLDL Cholesterol Cal 28 5 - 40 mg/dL   LDL Chol Calc (NIH) 86 0 - 99 mg/dL   Chol/HDL Ratio 2.9 0.0 - 4.4 ratio  TSH   Collection Time: 03/24/23  9:48 AM  Result Value Ref Range   TSH 1.550 0.450 - 4.500 uIU/mL  T4, free   Collection Time: 03/24/23  9:48 AM  Result Value Ref Range   Free T4 1.42 0.82 - 1.77 ng/dL      Assessment & Plan:   Problem List Items Addressed This Visit       Other   Fatigue - Primary   Labs ordered for evaluation. Suspect it is related to recent stress.  Will make recommendations based on results.       Relevant Orders   Anemia Profile B   Vitamin D (25 hydroxy)   TSH   T4, free   Other Visit Diagnoses       Right arm pain       Suspect it is related to a pinched nerve. Will refer to Physical therapy for evaluation and treatment. Can use lidocaine patches to help with symtoms.   Relevant Orders   Ambulatory referral to Physical Therapy        Follow up  plan: Return if symptoms worsen or fail to improve.   A total of 30 minutes were spent on this encounter today.  When total time is documented, this includes both the face-to-face and non-face-to-face time personally spent before, during and after the visit on the date of the encounter discussing symptoms, plan of care and follow up.

## 2023-05-19 NOTE — Telephone Encounter (Signed)
    Chief Complaint: Right shoulder, arm pain and numbness. Wrist is weak. Symptoms: Above Frequency: Monday Pertinent Negatives: Patient denies  Disposition: [] ED /[] Urgent Care (no appt availability in office) / [x] Appointment(In office/virtual)/ []  [facility]/ [] Home Care/ [] Refused Recommended Disposition /[] [place] [facility]/ []  Follow-up with PCP Additional Notes: Agrees with appointment.  Reason for Disposition  Weakness (i.e., loss of strength) in hand or fingers  (Exception: Not truly weak; hand feels weak because of pain.)  Answer Assessment - Initial Assessment Questions 1. ONSET: "When did the pain start?"     Monday 2. LOCATION: "Where is the pain located?"     Right arm, shoulder 3. PAIN: "How bad is the pain?" (Scale 1-10; or mild, moderate, severe)   - MILD (1-3): Doesn't interfere with normal activities.   - MODERATE (4-7): Interferes with normal activities (e.g., work or school) or awakens from sleep.   - SEVERE (8-10): Excruciating pain, unable to do any normal activities, unable to hold a cup of water.     8 4. WORK OR EXERCISE: "Has there been any recent work or exercise that involved this part of the body?"     No 5. CAUSE: "What do you think is causing the arm pain?"     Unsure 6. OTHER SYMPTOMS: "Do you have any other symptoms?" (e.g., neck pain, swelling, rash, fever, numbness, weakness)     Wrist is weak 7. PREGNANCY: "Is there any chance you are pregnant?" "When was your last menstrual period?"     No  Protocols used: Arm Pain-A-AH

## 2023-05-19 NOTE — Assessment & Plan Note (Signed)
Labs ordered for evaluation. Suspect it is related to recent stress.  Will make recommendations based on results.

## 2023-05-20 LAB — ANEMIA PROFILE B
Basophils Absolute: 0.1 10*3/uL (ref 0.0–0.2)
Basos: 1 %
EOS (ABSOLUTE): 0.2 10*3/uL (ref 0.0–0.4)
Eos: 2 %
Ferritin: 46 ng/mL (ref 15–150)
Folate: 18.5 ng/mL (ref >3.0)
Hematocrit: 39.9 % (ref 34.0–46.6)
Hemoglobin: 12.2 g/dL (ref 11.1–15.9)
Immature Grans (Abs): 0 10*3/uL (ref 0.0–0.1)
Immature Granulocytes: 0 %
Iron Saturation: 19 % (ref 15–55)
Iron: 69 ug/dL (ref 27–159)
Lymphocytes Absolute: 2.8 10*3/uL (ref 0.7–3.1)
Lymphs: 38 %
MCH: 25.4 pg — ABNORMAL LOW (ref 26.6–33.0)
MCHC: 30.6 g/dL — ABNORMAL LOW (ref 31.5–35.7)
MCV: 83 fL (ref 79–97)
Monocytes Absolute: 0.6 10*3/uL (ref 0.1–0.9)
Monocytes: 8 %
Neutrophils Absolute: 3.8 10*3/uL (ref 1.4–7.0)
Neutrophils: 51 %
Platelets: 340 10*3/uL (ref 150–450)
RBC: 4.81 x10E6/uL (ref 3.77–5.28)
RDW: 12.9 % (ref 11.7–15.4)
Retic Ct Pct: 1.7 % (ref 0.6–2.6)
Total Iron Binding Capacity: 361 ug/dL (ref 250–450)
UIBC: 292 ug/dL (ref 131–425)
Vitamin B-12: 786 pg/mL (ref 232–1245)
WBC: 7.5 10*3/uL (ref 3.4–10.8)

## 2023-05-20 LAB — T4, FREE: Free T4: 1.16 ng/dL (ref 0.82–1.77)

## 2023-05-20 LAB — TSH: TSH: 2.56 u[IU]/mL (ref 0.450–4.500)

## 2023-05-20 LAB — VITAMIN D 25 HYDROXY (VIT D DEFICIENCY, FRACTURES): Vit D, 25-Hydroxy: 48.9 ng/mL (ref 30.0–100.0)

## 2023-06-06 DIAGNOSIS — M9901 Segmental and somatic dysfunction of cervical region: Secondary | ICD-10-CM | POA: Diagnosis not present

## 2023-06-06 DIAGNOSIS — M25531 Pain in right wrist: Secondary | ICD-10-CM | POA: Diagnosis not present

## 2023-06-06 DIAGNOSIS — M5414 Radiculopathy, thoracic region: Secondary | ICD-10-CM | POA: Diagnosis not present

## 2023-06-06 DIAGNOSIS — M9902 Segmental and somatic dysfunction of thoracic region: Secondary | ICD-10-CM | POA: Diagnosis not present

## 2023-06-06 DIAGNOSIS — R519 Headache, unspecified: Secondary | ICD-10-CM | POA: Diagnosis not present

## 2023-06-06 DIAGNOSIS — M25511 Pain in right shoulder: Secondary | ICD-10-CM | POA: Diagnosis not present

## 2023-06-28 DIAGNOSIS — M9901 Segmental and somatic dysfunction of cervical region: Secondary | ICD-10-CM | POA: Diagnosis not present

## 2023-06-28 DIAGNOSIS — M25511 Pain in right shoulder: Secondary | ICD-10-CM | POA: Diagnosis not present

## 2023-06-28 DIAGNOSIS — M5414 Radiculopathy, thoracic region: Secondary | ICD-10-CM | POA: Diagnosis not present

## 2023-06-28 DIAGNOSIS — M25531 Pain in right wrist: Secondary | ICD-10-CM | POA: Diagnosis not present

## 2023-06-28 DIAGNOSIS — M9902 Segmental and somatic dysfunction of thoracic region: Secondary | ICD-10-CM | POA: Diagnosis not present

## 2023-06-28 DIAGNOSIS — R519 Headache, unspecified: Secondary | ICD-10-CM | POA: Diagnosis not present

## 2023-07-19 DIAGNOSIS — R519 Headache, unspecified: Secondary | ICD-10-CM | POA: Diagnosis not present

## 2023-07-19 DIAGNOSIS — M9902 Segmental and somatic dysfunction of thoracic region: Secondary | ICD-10-CM | POA: Diagnosis not present

## 2023-07-19 DIAGNOSIS — M5414 Radiculopathy, thoracic region: Secondary | ICD-10-CM | POA: Diagnosis not present

## 2023-07-19 DIAGNOSIS — M25531 Pain in right wrist: Secondary | ICD-10-CM | POA: Diagnosis not present

## 2023-07-19 DIAGNOSIS — M25511 Pain in right shoulder: Secondary | ICD-10-CM | POA: Diagnosis not present

## 2023-07-19 DIAGNOSIS — M9901 Segmental and somatic dysfunction of cervical region: Secondary | ICD-10-CM | POA: Diagnosis not present

## 2023-07-25 DIAGNOSIS — M13862 Other specified arthritis, left knee: Secondary | ICD-10-CM | POA: Diagnosis not present

## 2023-08-02 ENCOUNTER — Other Ambulatory Visit: Payer: Self-pay | Admitting: Nurse Practitioner

## 2023-08-04 NOTE — Telephone Encounter (Signed)
 OV 05/19/23 Requested Prescriptions  Pending Prescriptions Disp Refills   lansoprazole (PREVACID) 30 MG capsule [Pharmacy Med Name: Lansoprazole 30 MG Oral Capsule Delayed Release] 90 capsule 0    Sig: Take 1 capsule by mouth once daily     Gastroenterology: Proton Pump Inhibitors 2 Failed - 08/04/2023 10:40 AM      Failed - Valid encounter within last 12 months    Recent Outpatient Visits   None     Future Appointments             In 1 month Larae Grooms, NP [facility]   In 6 months MacDiarmid, Lorin Picket, MD [facility] [place]            Passed - ALT in normal range and within 360 days    ALT  Date Value Ref Range Status  03/24/2023 19 0 - 32 IU/L Final         Passed - AST in normal range and within 360 days    AST  Date Value Ref Range Status  03/24/2023 21 0 - 40 IU/L Final

## 2023-08-09 DIAGNOSIS — M9901 Segmental and somatic dysfunction of cervical region: Secondary | ICD-10-CM | POA: Diagnosis not present

## 2023-08-09 DIAGNOSIS — M5414 Radiculopathy, thoracic region: Secondary | ICD-10-CM | POA: Diagnosis not present

## 2023-08-09 DIAGNOSIS — M9902 Segmental and somatic dysfunction of thoracic region: Secondary | ICD-10-CM | POA: Diagnosis not present

## 2023-08-09 DIAGNOSIS — R519 Headache, unspecified: Secondary | ICD-10-CM | POA: Diagnosis not present

## 2023-08-09 DIAGNOSIS — M25531 Pain in right wrist: Secondary | ICD-10-CM | POA: Diagnosis not present

## 2023-08-09 DIAGNOSIS — M25511 Pain in right shoulder: Secondary | ICD-10-CM | POA: Diagnosis not present

## 2023-08-15 DIAGNOSIS — M2550 Pain in unspecified joint: Secondary | ICD-10-CM | POA: Diagnosis not present

## 2023-08-30 ENCOUNTER — Other Ambulatory Visit: Payer: Self-pay | Admitting: Nurse Practitioner

## 2023-08-30 DIAGNOSIS — M25511 Pain in right shoulder: Secondary | ICD-10-CM | POA: Diagnosis not present

## 2023-08-30 DIAGNOSIS — M9901 Segmental and somatic dysfunction of cervical region: Secondary | ICD-10-CM | POA: Diagnosis not present

## 2023-08-30 DIAGNOSIS — M25531 Pain in right wrist: Secondary | ICD-10-CM | POA: Diagnosis not present

## 2023-08-30 DIAGNOSIS — M5414 Radiculopathy, thoracic region: Secondary | ICD-10-CM | POA: Diagnosis not present

## 2023-08-30 DIAGNOSIS — R519 Headache, unspecified: Secondary | ICD-10-CM | POA: Diagnosis not present

## 2023-08-30 DIAGNOSIS — M9902 Segmental and somatic dysfunction of thoracic region: Secondary | ICD-10-CM | POA: Diagnosis not present

## 2023-09-01 ENCOUNTER — Encounter: Payer: Self-pay | Admitting: Nurse Practitioner

## 2023-09-01 ENCOUNTER — Ambulatory Visit
Admission: RE | Admit: 2023-09-01 | Discharge: 2023-09-01 | Disposition: A | Discharge status: Home / Self Care | Source: Ambulatory Visit | Attending: Nurse Practitioner | Admitting: Nurse Practitioner

## 2023-09-01 ENCOUNTER — Ambulatory Visit: Admitting: Nurse Practitioner

## 2023-09-01 ENCOUNTER — Ambulatory Visit
Admission: RE | Admit: 2023-09-01 | Discharge: 2023-09-01 | Disposition: A | Discharge status: Home / Self Care | Attending: Nurse Practitioner | Admitting: Nurse Practitioner

## 2023-09-01 VITALS — BP 120/85 | HR 80 | Temp 98.2°F | Resp 15 | Ht 62.01 in | Wt 169.6 lb

## 2023-09-01 DIAGNOSIS — M898X9 Other specified disorders of bone, unspecified site: Secondary | ICD-10-CM | POA: Diagnosis not present

## 2023-09-01 DIAGNOSIS — G8929 Other chronic pain: Secondary | ICD-10-CM

## 2023-09-01 DIAGNOSIS — M25562 Pain in left knee: Secondary | ICD-10-CM | POA: Insufficient documentation

## 2023-09-01 DIAGNOSIS — M25561 Pain in right knee: Secondary | ICD-10-CM

## 2023-09-01 DIAGNOSIS — M25462 Effusion, left knee: Secondary | ICD-10-CM | POA: Diagnosis not present

## 2023-09-01 DIAGNOSIS — E039 Hypothyroidism, unspecified: Secondary | ICD-10-CM

## 2023-09-01 DIAGNOSIS — E7800 Pure hypercholesterolemia, unspecified: Secondary | ICD-10-CM

## 2023-09-01 DIAGNOSIS — M1712 Unilateral primary osteoarthritis, left knee: Secondary | ICD-10-CM | POA: Diagnosis not present

## 2023-09-01 DIAGNOSIS — M1711 Unilateral primary osteoarthritis, right knee: Secondary | ICD-10-CM | POA: Diagnosis not present

## 2023-09-01 MED ORDER — LEVOTHYROXINE SODIUM 50 MCG PO TABS: 50.0000 ug | ORAL_TABLET | Freq: Every day | ORAL | 1 refills | Status: AC

## 2023-09-01 MED ORDER — LEVOTHYROXINE SODIUM 75 MCG PO TABS: 75.0000 ug | ORAL_TABLET | Freq: Every day | ORAL | 1 refills | Status: AC

## 2023-09-01 MED ORDER — MONTELUKAST SODIUM 10 MG PO TABS: 10.0000 mg | ORAL_TABLET | Freq: Every day | ORAL | 1 refills | Status: AC

## 2023-09-01 NOTE — Assessment & Plan Note (Signed)
Chronic.  Controlled.  Continue with current medication regimen on regimen of Levothyroxine 77mg and alternating with levothyroxine 740m.  Refills sent today.  Labs ordered today.  Return to clinic in 6 months for reevaluation.  Call sooner if concerns arise.

## 2023-09-01 NOTE — Assessment & Plan Note (Signed)
 Labs ordered at visit today.  Will make recommendations based on lab results.

## 2023-09-01 NOTE — Progress Notes (Signed)
 BP 120/85 (BP Location: Left Arm, Patient Position: Sitting, Cuff Size: Normal)   Pulse 80   Temp 98.2 F (36.8 C) (Oral)   Resp 15   Ht 5' 2.01" (1.575 m)   Wt 169 lb 9.6 oz (76.9 kg)   SpO2 100%   BMI 31.01 kg/m    Subjective:    Patient ID: Martha Henry, female    DOB: 01-07-1966, 58 y.o.   MRN: [id number]  HPI: Martha Henry is a 58 y.o. female  Chief Complaint  Patient presents with   Hypothyroidism    No issues   Knee Pain    [facility] states for arthritis for pain and swelling. Treated with steroids. Unsure what blood results have shown as she was referred for possible RZ. Knees, ankles and legs swelling from knees down. Pain is from hips down. Tylenol  arthritis is not helping any. Is getting worse and is unable to function.    HYPOTHYROIDISM Patient feels like she is doing well.  She is using Levothyroxine  50mcg M,W, F,S and 75mcg on T, Th, S- this has been her regimen for several years. Thyroid  control status:controlled Satisfied with current treatment? yes Medication side effects: no Medication compliance: excellent compliance Etiology of hypothyroidism:  Recent dose adjustment:no Fatigue:some Cold intolerance: no Heat intolerance: no Weight gain: no Weight loss: no Constipation: no Diarrhea/loose stools: yes Palpitations: no Lower extremity edema: no Anxiety/depressed mood: no  Patient states she was seen by [facility] for left knee pain and swelling in March.  Xray showed arthritis.  She took a week of prednisone  but not all the swelling resolved.  She followed up and had RA testing.  She was sent for [facility] clinic.  She can't get [facility] to call her back.  The pain has worsened.  Now having pain in both hips and both knees. Both knees are swollen.  Now both ankles are painful and swollen.  Feels like a deep ache in her bones.  Feels weakness when is standing.   She is currently taking Tylenol  arthritis which is helping but then she was taking it 3x  daily then quit because it stopped working.     Relevant past medical, surgical, family and social history reviewed and updated as indicated. Interim medical history since our last visit reviewed. Allergies and medications reviewed and updated.  Review of Systems  Constitutional:  Negative for fatigue and unexpected weight change.  Cardiovascular:  Negative for palpitations and leg swelling.  Gastrointestinal:  Negative for constipation and diarrhea.  Endocrine: Negative for cold intolerance and heat intolerance.  Musculoskeletal:  Positive for arthralgias and joint swelling.       Bone pain  Psychiatric/Behavioral:  Negative for dysphoric mood. The patient is not nervous/anxious.     Per HPI unless specifically indicated above     Objective:    BP 120/85 (BP Location: Left Arm, Patient Position: Sitting, Cuff Size: Normal)   Pulse 80   Temp 98.2 F (36.8 C) (Oral)   Resp 15   Ht 5' 2.01" (1.575 m)   Wt 169 lb 9.6 oz (76.9 kg)   SpO2 100%   BMI 31.01 kg/m   Wt Readings from Last 3 Encounters:  09/01/23 169 lb 9.6 oz (76.9 kg)  05/19/23 169 lb 6.4 oz (76.8 kg)  03/24/23 164 lb (74.4 kg)    Physical Exam Vitals and nursing note reviewed.  Constitutional:      General: She is not in acute distress.    Appearance: Normal appearance.  She is normal weight. She is not ill-appearing, toxic-appearing or diaphoretic.  HENT:     Head: Normocephalic.     Right Ear: External ear normal.     Left Ear: External ear normal.     Nose: Nose normal.     Mouth/Throat:     Mouth: Mucous membranes are moist.     Pharynx: Oropharynx is clear.  Eyes:     General:        Right eye: No discharge.        Left eye: No discharge.     Extraocular Movements: Extraocular movements intact.     Conjunctiva/sclera: Conjunctivae normal.     Pupils: Pupils are equal, round, and reactive to light.  Cardiovascular:     Rate and Rhythm: Normal rate and regular rhythm.     Heart sounds: No murmur  heard. Pulmonary:     Effort: Pulmonary effort is normal. No respiratory distress.     Breath sounds: Normal breath sounds. No wheezing or rales.  Musculoskeletal:     Cervical back: Normal range of motion and neck supple.     Right lower leg: Swelling present. No deformity, lacerations, tenderness or bony tenderness. 1+ Edema present.     Left lower leg: Swelling present. No deformity, lacerations, tenderness or bony tenderness. 2+ Edema present.  Skin:    General: Skin is warm and dry.     Capillary Refill: Capillary refill takes less than 2 seconds.  Neurological:     General: No focal deficit present.     Mental Status: She is alert and oriented to person, place, and time. Mental status is at baseline.  Psychiatric:        Mood and Affect: Mood normal.        Behavior: Behavior normal.        Thought Content: Thought content normal.        Judgment: Judgment normal.     Results for orders placed or performed in visit on 05/19/23  Anemia Profile B   Collection Time: 05/19/23  9:51 AM  Result Value Ref Range   Total Iron Binding Capacity 361 250 - 450 ug/dL   UIBC 454 098 - 119 ug/dL   Iron 69 27 - 147 ug/dL   Iron Saturation 19 15 - 55 %   Ferritin 46 15 - 150 ng/mL   Vitamin B-12 786 232 - 1,245 pg/mL   Folate 18.5 >3.0 ng/mL   WBC 7.5 3.4 - 10.8 x10E3/uL   RBC 4.81 3.77 - 5.28 x10E6/uL   Hemoglobin 12.2 11.1 - 15.9 g/dL   Hematocrit 82.9 56.2 - 46.6 %   MCV 83 79 - 97 fL   MCH 25.4 (L) 26.6 - 33.0 pg   MCHC 30.6 (L) 31.5 - 35.7 g/dL   RDW 13.0 86.5 - 78.4 %   Platelets 340 150 - 450 x10E3/uL   Neutrophils 51 Not Estab. %   Lymphs 38 Not Estab. %   Monocytes 8 Not Estab. %   Eos 2 Not Estab. %   Basos 1 Not Estab. %   Neutrophils Absolute 3.8 1.4 - 7.0 x10E3/uL   Lymphocytes Absolute 2.8 0.7 - 3.1 x10E3/uL   Monocytes Absolute 0.6 0.1 - 0.9 x10E3/uL   EOS (ABSOLUTE) 0.2 0.0 - 0.4 x10E3/uL   Basophils Absolute 0.1 0.0 - 0.2 x10E3/uL   Immature Granulocytes 0 Not  Estab. %   Immature Grans (Abs) 0.0 0.0 - 0.1 x10E3/uL   Retic Ct Pct 1.7 0.6 -  2.6 %  Vitamin D  (25 hydroxy)   Collection Time: 05/19/23  9:51 AM  Result Value Ref Range   Vit D, 25-Hydroxy 48.9 30.0 - 100.0 ng/mL  TSH   Collection Time: 05/19/23  9:51 AM  Result Value Ref Range   TSH 2.560 0.450 - 4.500 uIU/mL  T4, free   Collection Time: 05/19/23  9:51 AM  Result Value Ref Range   Free T4 1.16 0.82 - 1.77 ng/dL      Assessment & Plan:   Problem List Items Addressed This Visit       Endocrine   Hypothyroidism - Primary   Chronic.  Controlled.  Continue with current medication regimen on regimen of Levothyroxine  50mcg and alternating with levothyroxine  75mcg.  Refills sent today.  Labs ordered today.  Return to clinic in 6 months for reevaluation.  Call sooner if concerns arise.       Relevant Medications   levothyroxine  (SYNTHROID ) 50 MCG tablet   levothyroxine  (SYNTHROID ) 75 MCG tablet   Other Relevant Orders   T4   TSH     Other   Elevated LDL cholesterol level   Labs ordered at visit today.  Will make recommendations based on lab results.        Relevant Orders   Comprehensive metabolic panel with GFR   Lipid panel   Other Visit Diagnoses       Bone pain       Relevant Orders   Ambulatory referral to Physical Medicine Rehab     Chronic pain of both knees       Reviewed labs from [facility]- no evidence of RA. Suspect pain is related to Osteoarthritis. Will obtain xrays to confirm. Will get injections set up once resulted.   Relevant Orders   DG Knee Complete 4 Views Left   DG Knee Complete 4 Views Right        Follow up plan: Return in about 6 months (around 03/03/2024) for Physical and Fasting labs.

## 2023-09-02 ENCOUNTER — Encounter: Payer: Self-pay | Admitting: Nurse Practitioner

## 2023-09-02 LAB — COMPREHENSIVE METABOLIC PANEL WITH GFR
ALT: 17 IU/L (ref 0–32)
AST: 19 IU/L (ref 0–40)
Albumin: 4.3 g/dL (ref 3.8–4.9)
Alkaline Phosphatase: 99 IU/L (ref 44–121)
BUN/Creatinine Ratio: 18 (ref 9–23)
BUN: 14 mg/dL (ref 6–24)
Bilirubin Total: 0.2 mg/dL (ref 0.0–1.2)
CO2: 24 mmol/L (ref 20–29)
Calcium: 9.4 mg/dL (ref 8.7–10.2)
Chloride: 102 mmol/L (ref 96–106)
Creatinine, Ser: 0.77 mg/dL (ref 0.57–1.00)
Globulin, Total: 2.2 g/dL (ref 1.5–4.5)
Glucose: 76 mg/dL (ref 70–99)
Potassium: 4.3 mmol/L (ref 3.5–5.2)
Sodium: 140 mmol/L (ref 134–144)
Total Protein: 6.5 g/dL (ref 6.0–8.5)
eGFR: 91 mL/min/{1.73_m2} (ref >59)

## 2023-09-02 LAB — LIPID PANEL
Chol/HDL Ratio: 2.8 ratio (ref 0.0–4.4)
Cholesterol, Total: 182 mg/dL (ref 100–199)
HDL: 64 mg/dL (ref >39)
LDL Chol Calc (NIH): 97 mg/dL (ref 0–99)
Triglycerides: 120 mg/dL (ref 0–149)
VLDL Cholesterol Cal: 21 mg/dL (ref 5–40)

## 2023-09-02 LAB — T4: T4, Total: 7.9 ug/dL (ref 4.5–12.0)

## 2023-09-02 LAB — TSH: TSH: 3.05 u[IU]/mL (ref 0.450–4.500)

## 2023-09-02 NOTE — Telephone Encounter (Signed)
 Duplicate request, Rx ordered 09/01/23 #90, 1 refill. Requested Prescriptions  Pending Prescriptions Disp Refills   levothyroxine  (SYNTHROID ) 50 MCG tablet [Pharmacy Med Name: Levothyroxine  Sodium 50 MCG Oral Tablet] 90 tablet 0    Sig: Take 1 tablet by mouth once daily     There is no refill protocol information for this order     levothyroxine  (SYNTHROID ) 75 MCG tablet [Pharmacy Med Name: Levothyroxine  Sodium 75 MCG Oral Tablet] 90 tablet 0    Sig: TAKE 1 TABLET BY MOUTH ONCE DAILY BEFORE BREAKFAST     There is no refill protocol information for this order

## 2023-09-06 ENCOUNTER — Encounter: Payer: Self-pay | Admitting: Nurse Practitioner

## 2023-09-09 ENCOUNTER — Encounter: Payer: Self-pay | Admitting: Nurse Practitioner

## 2023-09-09 ENCOUNTER — Ambulatory Visit: Admitting: Nurse Practitioner

## 2023-09-09 VITALS — BP 122/86 | HR 75 | Temp 97.9°F | Ht 62.0 in | Wt 169.8 lb

## 2023-09-09 DIAGNOSIS — M1712 Unilateral primary osteoarthritis, left knee: Secondary | ICD-10-CM | POA: Diagnosis not present

## 2023-09-09 NOTE — Progress Notes (Signed)
 BP 122/86 (BP Location: Left Arm, Patient Position: Sitting, Cuff Size: Normal)   Pulse 75   Temp 97.9 F (36.6 C) (Oral)   Ht 5\' 2"  (1.575 m)   Wt 169 lb 12.8 oz (77 kg)   SpO2 98%   BMI 31.06 kg/m    Subjective:    Patient ID: Martha Henry, female    DOB: 1966/04/26, 58 y.o.   MRN: [id number]  HPI: Martha Henry is a 58 y.o. female  Chief Complaint  Patient presents with   Knee Injection    KNEE PAIN Left knee pain- Will receive knee injection today. Duration: weeks Involved knee: bilateral Mechanism of injury: unknown Location:diffuse Onset: gradual Severity: 7/10  Quality:  aching and throbbing Frequency: constant Radiation: no Aggravating factors: weight bearing and walking  Alleviating factors: rest  Status: stable Treatments attempted: rest  Relief with NSAIDs?:  mild Weakness with weight bearing or walking: no Sensation of giving way: no Locking: no Popping: no Bruising: no Swelling: yes Redness: no Paresthesias/decreased sensation: no Fevers: no    Relevant past medical, surgical, family and social history reviewed and updated as indicated. Interim medical history since our last visit reviewed. Allergies and medications reviewed and updated.  Review of Systems  Musculoskeletal:  Positive for arthralgias.    Per HPI unless specifically indicated above     Objective:     BP 122/86 (BP Location: Left Arm, Patient Position: Sitting, Cuff Size: Normal)   Pulse 75   Temp 97.9 F (36.6 C) (Oral)   Ht 5\' 2"  (1.575 m)   Wt 169 lb 12.8 oz (77 kg)   SpO2 98%   BMI 31.06 kg/m   Wt Readings from Last 3 Encounters:  09/09/23 169 lb 12.8 oz (77 kg)  09/01/23 169 lb 9.6 oz (76.9 kg)  05/19/23 169 lb 6.4 oz (76.8 kg)    Physical Exam Vitals and nursing note reviewed.  Constitutional:      General: She is not in acute distress.    Appearance: Normal appearance. She is normal weight. She is not ill-appearing, toxic-appearing or  diaphoretic.  HENT:     Head: Normocephalic.     Right Ear: External ear normal.     Left Ear: External ear normal.     Nose: Nose normal.     Mouth/Throat:     Mouth: Mucous membranes are moist.     Pharynx: Oropharynx is clear.  Eyes:     General:        Right eye: No discharge.        Left eye: No discharge.     Extraocular Movements: Extraocular movements intact.     Conjunctiva/sclera: Conjunctivae normal.     Pupils: Pupils are equal, round, and reactive to light.  Cardiovascular:     Rate and Rhythm: Normal rate and regular rhythm.     Heart sounds: No murmur heard. Pulmonary:     Effort: Pulmonary effort is normal. No respiratory distress.     Breath sounds: Normal breath sounds. No wheezing or rales.  Musculoskeletal:        General: Swelling and tenderness present.     Cervical back: Normal range of motion and neck supple.  Skin:    General: Skin is warm and dry.     Capillary Refill: Capillary refill takes less than 2 seconds.  Neurological:     General: No focal deficit present.     Mental Status: She is alert and oriented to person, place,  and time. Mental status is at baseline.  Psychiatric:        Mood and Affect: Mood normal.        Behavior: Behavior normal.        Thought Content: Thought content normal.        Judgment: Judgment normal.     Results for orders placed or performed in visit on 09/01/23  Comprehensive metabolic panel with GFR   Collection Time: 09/01/23 11:10 AM  Result Value Ref Range   Glucose 76 70 - 99 mg/dL   BUN 14 6 - 24 mg/dL   Creatinine, Ser 9.56 0.57 - 1.00 mg/dL   eGFR 91 >38 VF/IEP/3.29   BUN/Creatinine Ratio 18 9 - 23   Sodium 140 134 - 144 mmol/L   Potassium 4.3 3.5 - 5.2 mmol/L   Chloride 102 96 - 106 mmol/L   CO2 24 20 - 29 mmol/L   Calcium 9.4 8.7 - 10.2 mg/dL   Total Protein 6.5 6.0 - 8.5 g/dL   Albumin 4.3 3.8 - 4.9 g/dL   Globulin, Total 2.2 1.5 - 4.5 g/dL   Bilirubin Total 0.2 0.0 - 1.2 mg/dL   Alkaline  Phosphatase 99 44 - 121 IU/L   AST 19 0 - 40 IU/L   ALT 17 0 - 32 IU/L  Lipid panel   Collection Time: 09/01/23 11:10 AM  Result Value Ref Range   Cholesterol, Total 182 100 - 199 mg/dL   Triglycerides 518 0 - 149 mg/dL   HDL 64 >84 mg/dL   VLDL Cholesterol Cal 21 5 - 40 mg/dL   LDL Chol Calc (NIH) 97 0 - 99 mg/dL   Chol/HDL Ratio 2.8 0.0 - 4.4 ratio  T4   Collection Time: 09/01/23 11:10 AM  Result Value Ref Range   T4, Total 7.9 4.5 - 12.0 ug/dL  TSH   Collection Time: 09/01/23 11:10 AM  Result Value Ref Range   TSH 3.050 0.450 - 4.500 uIU/mL      Assessment & Plan:   Problem List Items Addressed This Visit   None Visit Diagnoses       Arthritis of left knee    -  Primary   See procedure note below.       Procedure: Left  Knee Intraarticular Steroid Injection        Diagnosis:   ICD-10-CM   1. Arthritis of left knee  M17.12    See procedure note below.      Provider: Aileen Alexanders, NP Consent:  Risks, benefits, and alternative treatments discussed and all questions were answered.  Patient elected to proceed and verbal consent obtained.  Description: Area prepped and draped using  semi-sterile technique.  Using a anterior/lateral approach, a mixture of 4 cc of  1% lidocaine  & 1 cc of Kenalog  40 was injected into knee joint.  A bandage was then placed over the injection site. Complications: none Post Procedure Instructions: Wound care instructions discussed and patient was instructed to keep area clean and dry.  Signs and symptoms of infection discussed, patient agrees to contact the office ASAP should they occur.  Follow Up: Return in about 1 week (around 09/16/2023) for Knee injection.  Follow up plan: Return in about 1 week (around 09/16/2023) for Knee injection.

## 2023-09-20 DIAGNOSIS — M9901 Segmental and somatic dysfunction of cervical region: Secondary | ICD-10-CM | POA: Diagnosis not present

## 2023-09-20 DIAGNOSIS — M9902 Segmental and somatic dysfunction of thoracic region: Secondary | ICD-10-CM | POA: Diagnosis not present

## 2023-09-20 DIAGNOSIS — M25531 Pain in right wrist: Secondary | ICD-10-CM | POA: Diagnosis not present

## 2023-09-20 DIAGNOSIS — M5414 Radiculopathy, thoracic region: Secondary | ICD-10-CM | POA: Diagnosis not present

## 2023-09-20 DIAGNOSIS — M25511 Pain in right shoulder: Secondary | ICD-10-CM | POA: Diagnosis not present

## 2023-09-20 DIAGNOSIS — R519 Headache, unspecified: Secondary | ICD-10-CM | POA: Diagnosis not present

## 2023-09-21 ENCOUNTER — Ambulatory Visit: Payer: Self-pay | Admitting: Nurse Practitioner

## 2023-11-01 ENCOUNTER — Other Ambulatory Visit: Payer: Self-pay | Admitting: Nurse Practitioner

## 2023-11-02 NOTE — Telephone Encounter (Signed)
 Requested Prescriptions  Pending Prescriptions Disp Refills   lansoprazole  (PREVACID ) 30 MG capsule [Pharmacy Med Name: Lansoprazole  30 MG Oral Capsule Delayed Release] 90 capsule 0    Sig: Take 1 capsule by mouth once daily     Gastroenterology: Proton Pump Inhibitors 2 Passed - 11/02/2023  4:46 PM      Passed - ALT in normal range and within 360 days    ALT  Date Value Ref Range Status  09/01/2023 17 0 - 32 IU/L Final         Passed - AST in normal range and within 360 days    AST  Date Value Ref Range Status  09/01/2023 19 0 - 40 IU/L Final         Passed - Valid encounter within last 12 months    Recent Outpatient Visits           1 month ago Arthritis of left knee   [facility] Melvin Pao, NP   2 months ago Hypothyroidism, unspecified type   [facility] Melvin Pao, NP       Future Appointments             In 3 months MacDiarmid, [place], MD [facility]

## 2024-02-06 ENCOUNTER — Ambulatory Visit: Payer: Self-pay | Admitting: Urology

## 2024-05-14 ENCOUNTER — Other Ambulatory Visit: Payer: Self-pay | Admitting: Nurse Practitioner
